# Patient Record
Sex: Male | Born: 1960
Health system: Southern US, Community
[De-identification: ages and names within clinical notes are randomized; demographics above are authoritative.]

## PROBLEM LIST (undated history)

## (undated) DIAGNOSIS — R740 Nonspecific elevation of levels of transaminase and lactic acid dehydrogenase [LDH]: Secondary | ICD-10-CM

## (undated) DIAGNOSIS — E66811 Obesity, class 1: Secondary | ICD-10-CM

## (undated) DIAGNOSIS — N182 Chronic kidney disease, stage 2 (mild): Secondary | ICD-10-CM

## (undated) DIAGNOSIS — L920 Granuloma annulare: Secondary | ICD-10-CM

## (undated) DIAGNOSIS — E782 Mixed hyperlipidemia: Secondary | ICD-10-CM

## (undated) DIAGNOSIS — E669 Obesity, unspecified: Secondary | ICD-10-CM

## (undated) DIAGNOSIS — K5792 Diverticulitis of intestine, part unspecified, without perforation or abscess without bleeding: Secondary | ICD-10-CM

## (undated) DIAGNOSIS — G459 Transient cerebral ischemic attack, unspecified: Secondary | ICD-10-CM

## (undated) DIAGNOSIS — R7301 Impaired fasting glucose: Secondary | ICD-10-CM

## (undated) DIAGNOSIS — I10 Essential (primary) hypertension: Secondary | ICD-10-CM

## (undated) HISTORY — PX: MOUTH SURGERY: SHX715

## (undated) HISTORY — DX: Obesity, unspecified: E66.9

## (undated) HISTORY — DX: Obesity, class 1: E66.811

## (undated) HISTORY — PX: COLONOSCOPY: SHX174

## (undated) HISTORY — DX: Mixed hyperlipidemia: E78.2

## (undated) HISTORY — DX: Chronic kidney disease, stage 2 (mild): N18.2

## (undated) HISTORY — PX: VASECTOMY: SHX75

## (undated) HISTORY — PX: BICEPS TENDON REPAIR: SHX566

## (undated) HISTORY — DX: Impaired fasting glucose: R73.01

## (undated) HISTORY — DX: Transient cerebral ischemic attack, unspecified: G45.9

## (undated) HISTORY — DX: Nonspecific elevation of levels of transaminase and lactic acid dehydrogenase (ldh): R74.0

## (undated) HISTORY — DX: Granuloma annulare: L92.0

## (undated) HISTORY — PX: COLON SURGERY: SHX602

---

## 2007-04-20 HISTORY — PX: LIPOMA RESECTION: SHX23

## 2008-02-20 DIAGNOSIS — L920 Granuloma annulare: Secondary | ICD-10-CM

## 2008-02-20 HISTORY — DX: Granuloma annulare: L92.0

## 2009-11-03 ENCOUNTER — Encounter: Admission: RE | Admit: 2009-11-03 | Discharge: 2009-11-03 | Payer: Self-pay | Admitting: Family Medicine

## 2013-04-19 HISTORY — PX: HAMMER TOE SURGERY: SHX385

## 2013-11-03 ENCOUNTER — Emergency Department (HOSPITAL_BASED_OUTPATIENT_CLINIC_OR_DEPARTMENT_OTHER)
Admission: EM | Admit: 2013-11-03 | Discharge: 2013-11-04 | Disposition: A | Discharge status: Home / Self Care | Payer: 59 | Attending: Emergency Medicine | Admitting: Emergency Medicine

## 2013-11-03 ENCOUNTER — Encounter (HOSPITAL_BASED_OUTPATIENT_CLINIC_OR_DEPARTMENT_OTHER): Payer: Self-pay | Admitting: Emergency Medicine

## 2013-11-03 DIAGNOSIS — K529 Noninfective gastroenteritis and colitis, unspecified: Secondary | ICD-10-CM

## 2013-11-03 DIAGNOSIS — I10 Essential (primary) hypertension: Secondary | ICD-10-CM | POA: Insufficient documentation

## 2013-11-03 DIAGNOSIS — F17200 Nicotine dependence, unspecified, uncomplicated: Secondary | ICD-10-CM | POA: Insufficient documentation

## 2013-11-03 DIAGNOSIS — K5289 Other specified noninfective gastroenteritis and colitis: Secondary | ICD-10-CM | POA: Insufficient documentation

## 2013-11-03 HISTORY — DX: Essential (primary) hypertension: I10

## 2013-11-03 NOTE — ED Notes (Signed)
Pt reports abd pain x2 days pt also reports foot surgery with general anesthisia on Thursday past

## 2013-11-04 ENCOUNTER — Emergency Department (HOSPITAL_BASED_OUTPATIENT_CLINIC_OR_DEPARTMENT_OTHER): Payer: 59

## 2013-11-04 ENCOUNTER — Encounter (HOSPITAL_BASED_OUTPATIENT_CLINIC_OR_DEPARTMENT_OTHER): Payer: Self-pay | Admitting: Emergency Medicine

## 2013-11-04 LAB — COMPREHENSIVE METABOLIC PANEL
ALK PHOS: 46 U/L (ref 39–117)
ALT: 24 U/L (ref 0–53)
ANION GAP: 17 — AB (ref 5–15)
AST: 17 U/L (ref 0–37)
Albumin: 4.2 g/dL (ref 3.5–5.2)
BUN: 15 mg/dL (ref 6–23)
CO2: 24 mEq/L (ref 19–32)
CREATININE: 1.2 mg/dL (ref 0.50–1.35)
Calcium: 9.7 mg/dL (ref 8.4–10.5)
Chloride: 100 mEq/L (ref 96–112)
GFR calc non Af Amer: 67 mL/min — ABNORMAL LOW (ref >90)
GFR, EST AFRICAN AMERICAN: 78 mL/min — AB (ref >90)
GLUCOSE: 183 mg/dL — AB (ref 70–99)
POTASSIUM: 3.5 meq/L — AB (ref 3.7–5.3)
SODIUM: 141 meq/L (ref 137–147)
TOTAL PROTEIN: 7.9 g/dL (ref 6.0–8.3)
Total Bilirubin: 0.7 mg/dL (ref 0.3–1.2)

## 2013-11-04 LAB — CBC WITH DIFFERENTIAL/PLATELET
BASOS PCT: 0 % (ref 0–1)
Basophils Absolute: 0 10*3/uL (ref 0.0–0.1)
EOS ABS: 0 10*3/uL (ref 0.0–0.7)
EOS PCT: 0 % (ref 0–5)
HCT: 46.3 % (ref 39.0–52.0)
HEMOGLOBIN: 16.6 g/dL (ref 13.0–17.0)
LYMPHS ABS: 2.3 10*3/uL (ref 0.7–4.0)
Lymphocytes Relative: 21 % (ref 12–46)
MCH: 34.9 pg — AB (ref 26.0–34.0)
MCHC: 35.9 g/dL (ref 30.0–36.0)
MCV: 97.5 fL (ref 78.0–100.0)
Monocytes Absolute: 0.8 10*3/uL (ref 0.1–1.0)
Monocytes Relative: 7 % (ref 3–12)
Neutro Abs: 7.9 10*3/uL — ABNORMAL HIGH (ref 1.7–7.7)
Neutrophils Relative %: 72 % (ref 43–77)
Platelets: 175 10*3/uL (ref 150–400)
RBC: 4.75 MIL/uL (ref 4.22–5.81)
RDW: 12.4 % (ref 11.5–15.5)
WBC: 11 10*3/uL — ABNORMAL HIGH (ref 4.0–10.5)

## 2013-11-04 LAB — URINALYSIS, ROUTINE W REFLEX MICROSCOPIC
BILIRUBIN URINE: NEGATIVE
Glucose, UA: NEGATIVE mg/dL
Hgb urine dipstick: NEGATIVE
KETONES UR: NEGATIVE mg/dL
Leukocytes, UA: NEGATIVE
Nitrite: NEGATIVE
PROTEIN: NEGATIVE mg/dL
UROBILINOGEN UA: 1 mg/dL (ref 0.0–1.0)
pH: 6 (ref 5.0–8.0)

## 2013-11-04 LAB — LIPASE, BLOOD: Lipase: 30 U/L (ref 11–59)

## 2013-11-04 LAB — OCCULT BLOOD X 1 CARD TO LAB, STOOL: FECAL OCCULT BLD: POSITIVE — AB

## 2013-11-04 MED ORDER — SODIUM CHLORIDE 0.9 % IV BOLUS (SEPSIS)
500.0000 mL | Freq: Once | INTRAVENOUS | Status: AC
  Administered 2013-11-04: 500 mL via INTRAVENOUS

## 2013-11-04 MED ORDER — IOHEXOL 300 MG/ML  SOLN
50.0000 mL | Freq: Once | INTRAMUSCULAR | Status: AC | PRN
  Administered 2013-11-04: 50 mL via ORAL

## 2013-11-04 MED ORDER — MORPHINE SULFATE 2 MG/ML IJ SOLN
2.0000 mg | Freq: Once | INTRAMUSCULAR | Status: AC
  Administered 2013-11-04: 2 mg via INTRAVENOUS
  Filled 2013-11-04: qty 1

## 2013-11-04 MED ORDER — ONDANSETRON HCL 4 MG/2ML IJ SOLN
4.0000 mg | Freq: Once | INTRAMUSCULAR | Status: AC
  Administered 2013-11-04: 4 mg via INTRAVENOUS
  Filled 2013-11-04: qty 2

## 2013-11-04 MED ORDER — CIPROFLOXACIN HCL 500 MG PO TABS: 500.0000 mg | ORAL_TABLET | Freq: Two times a day (BID) | ORAL | Status: DC

## 2013-11-04 MED ORDER — MORPHINE SULFATE 4 MG/ML IJ SOLN
4.0000 mg | Freq: Once | INTRAMUSCULAR | Status: AC
  Administered 2013-11-04: 4 mg via INTRAVENOUS
  Filled 2013-11-04: qty 1

## 2013-11-04 MED ORDER — CIPROFLOXACIN HCL 500 MG PO TABS
500.0000 mg | ORAL_TABLET | Freq: Once | ORAL | Status: AC
  Administered 2013-11-04: 500 mg via ORAL
  Filled 2013-11-04: qty 1

## 2013-11-04 MED ORDER — METRONIDAZOLE 500 MG PO TABS: 500.0000 mg | ORAL_TABLET | Freq: Three times a day (TID) | ORAL | Status: DC

## 2013-11-04 MED ORDER — IOHEXOL 300 MG/ML  SOLN
100.0000 mL | Freq: Once | INTRAMUSCULAR | Status: AC | PRN
  Administered 2013-11-04: 100 mL via INTRAVENOUS

## 2013-11-04 MED ORDER — ALUM & MAG HYDROXIDE-SIMETH 200-200-20 MG/5ML PO SUSP
30.0000 mL | Freq: Once | ORAL | Status: AC
  Administered 2013-11-04: 30 mL via ORAL

## 2013-11-04 MED ORDER — METRONIDAZOLE 500 MG PO TABS
500.0000 mg | ORAL_TABLET | Freq: Once | ORAL | Status: AC
  Administered 2013-11-04: 500 mg via ORAL
  Filled 2013-11-04: qty 1

## 2013-11-04 MED ORDER — MAGNESIUM HYDROXIDE 400 MG/5ML PO SUSP
30.0000 mL | Freq: Every evening | ORAL | Status: DC | PRN
  Filled 2013-11-04: qty 30

## 2013-11-04 MED ORDER — OXYCODONE-ACETAMINOPHEN 5-325 MG PO TABS: 2.0000 | ORAL_TABLET | Freq: Four times a day (QID) | ORAL | Status: DC | PRN

## 2013-11-04 MED ORDER — ALUM & MAG HYDROXIDE-SIMETH 200-200-20 MG/5ML PO SUSP
ORAL | Status: AC
  Filled 2013-11-04: qty 30

## 2013-11-04 NOTE — ED Notes (Signed)
Patient reports that he is having frequent frank bloody stools. He is also having lower pelvic pain in waves. The patient has a history of diverticulitis.

## 2013-11-04 NOTE — ED Notes (Signed)
Urine specimen requested pt states unable to void at this time. 

## 2013-11-04 NOTE — ED Provider Notes (Signed)
CSN: [id number]     Arrival date & time 11/03/13  2329 History  This chart was scribed for Jonathan Cumbo [name], MD by Phillis HaggisGabriella Gaje, ED Scribe. This patient was seen in room MH03/MH03 and patient care was started at 12:38 AM.   Chief Complaint  Patient presents with  . Abdominal Pain   Patient is a 53 y.o. male presenting with abdominal pain. The history is provided by the patient. No language interpreter was used.  Abdominal Pain Pain location:  Suprapubic Pain quality: cramping, pressure and sharp   Pain radiates to:  Does not radiate Pain severity:  Severe Onset quality:  Sudden Timing:  Constant Context: not alcohol use and not trauma   Relieved by:  Nothing Worsened by:  Nothing tried Ineffective treatments:  None tried Associated symptoms: diarrhea   Associated symptoms: no chest pain, no fever, no nausea and no vomiting   Risk factors: has not had multiple surgeries   HPI Comments: Jonathan Graves is a 53 y.o. male with a history of HTN who presents to the Emergency Department complaining of abdominal pain onset two days ago. He reports tremendous pain in his lower abdomen and bloody mucous diarrhea and has been passing a lot of gas. He states that he had foot surgery Tuesday with high BP issues, but went to work on Friday.  He stopped taking the pain meds for the foot surgery on Thursday. He reports a history of diverticulitis and believes that this is another attack. He reports that he was not hospitalized for his last episode of diverticulitis, which was 4 years ago.He denies taking anything for the pain.  He states that he was on oxycodone for pain for two days then quit soon after. He started taking BP medication Tuesday but has never had history of BP before surgery. He and his wife have continually measured his BP over the past week.He reports that he saw Dr. Foy GuadalajaraFried Thursday for a f/u.  Dr. Foy GuadalajaraFried took a blood panel but the panel has not come back yet. He denies vomiting, fever,  post-surgery complications, diarrhea, or bowel incontinence. He denies any changes in lifestyle, excluding his vacation last week. He denies history of other surgeries but recently had a colonoscopy, with negative results. He states that he occasionally smokes and drinks.   Past Medical History  Diagnosis Date  . Hypertension    History reviewed. No pertinent past surgical history. History reviewed. No pertinent family history. History  Substance Use Topics  . Smoking status: Current Some Day Smoker  . Smokeless tobacco: Not on file  . Alcohol Use: Yes    Review of Systems  Constitutional: Negative for fever.  Cardiovascular: Negative for chest pain.  Gastrointestinal: Positive for abdominal pain, diarrhea and blood in stool. Negative for nausea and vomiting.  All other systems reviewed and are negative.  Allergies  Review of patient's allergies indicates no known allergies.  Home Medications   Prior to Admission medications   Not on File   BP 185/99  Pulse 59  Temp(Src) 98.1 F (36.7 C) (Oral)  Resp 20  SpO2 96% Physical Exam  Constitutional: He is oriented to person, place, and time. He appears well-developed and well-nourished. No distress.  HENT:  Head: Normocephalic and atraumatic.  Mouth/Throat: Oropharynx is clear and moist. No oropharyngeal exudate.  Eyes: Conjunctivae and EOM are normal. Pupils are equal, round, and reactive to light.  Neck: Normal range of motion. Neck supple.  Cardiovascular: Normal rate, regular rhythm and normal heart  sounds.   Pulmonary/Chest: Effort normal and breath sounds normal. He has no wheezes. He has no rales. He exhibits no tenderness.  Abdominal: Soft. He exhibits distension. He exhibits no mass. There is no tenderness. There is no rebound and no guarding.  Hyperactive bowel sounds. Worse in RUQ. Stool in rectum and sigmoid colon. Full, bloating.   Musculoskeletal: Normal range of motion. He exhibits no edema.  Lymphadenopathy:     He has no cervical adenopathy.  Neurological: He is alert and oriented to person, place, and time. He has normal reflexes.  Skin: Skin is warm and dry.  Psychiatric: He has a normal mood and affect. His behavior is normal.    ED Course  Procedures (including critical care time) DIAGNOSTIC STUDIES: Oxygen Saturation is 96% on room air, normal by my interpretation.    COORDINATION OF CARE: 12:45 AM-Discussed treatment plan which includes CT scan with pt at bedside and pt agreed to plan.   Labs Review Labs Reviewed  CBC WITH DIFFERENTIAL - Abnormal; Notable for the following:    WBC 11.0 (*)    MCH 34.9 (*)    Neutro Abs 7.9 (*)    All other components within normal limits  COMPREHENSIVE METABOLIC PANEL - Abnormal; Notable for the following:    Potassium 3.5 (*)    Glucose, Bld 183 (*)    GFR calc non Af Amer 67 (*)    GFR calc Af Amer 78 (*)    Anion gap 17 (*)    All other components within normal limits  OCCULT BLOOD X 1 CARD TO LAB, STOOL - Abnormal; Notable for the following:    Fecal Occult Bld POSITIVE (*)    All other components within normal limits  LIPASE, BLOOD  URINALYSIS, ROUTINE W REFLEX MICROSCOPIC  POC OCCULT BLOOD, ED   Imaging Review No results found.   EKG Interpretation None      MDM   Final diagnoses:  None    Hemoglobin is normal is able to tolerate PO meds and would like to go home.  Will d/c on cipro, flagyl and percocet and recommend close follow up with both his PMD for recheck and HTN and GI for colonoscopy.  Patient and family verbalize understanding and agree to follow up.  Return immediately for fever, inability to tolerate medication or food, intractable pain or any concerns.     I personally performed the services described in this documentation, which was scribed in my presence. The recorded information has been reviewed and is accurate.      [name], MD 11/04/13 1610

## 2013-11-04 NOTE — Discharge Instructions (Signed)

## 2014-04-13 ENCOUNTER — Encounter (HOSPITAL_COMMUNITY): Payer: Self-pay | Admitting: *Deleted

## 2014-04-13 ENCOUNTER — Emergency Department (HOSPITAL_COMMUNITY)
Admission: EM | Admit: 2014-04-13 | Discharge: 2014-04-13 | Disposition: A | Discharge status: Home / Self Care | Payer: 59 | Attending: Emergency Medicine | Admitting: Emergency Medicine

## 2014-04-13 DIAGNOSIS — Z7982 Long term (current) use of aspirin: Secondary | ICD-10-CM | POA: Insufficient documentation

## 2014-04-13 DIAGNOSIS — R55 Syncope and collapse: Secondary | ICD-10-CM | POA: Diagnosis not present

## 2014-04-13 DIAGNOSIS — Y998 Other external cause status: Secondary | ICD-10-CM | POA: Insufficient documentation

## 2014-04-13 DIAGNOSIS — Z720 Tobacco use: Secondary | ICD-10-CM | POA: Insufficient documentation

## 2014-04-13 DIAGNOSIS — I10 Essential (primary) hypertension: Secondary | ICD-10-CM | POA: Insufficient documentation

## 2014-04-13 DIAGNOSIS — Y9389 Activity, other specified: Secondary | ICD-10-CM | POA: Insufficient documentation

## 2014-04-13 DIAGNOSIS — K5792 Diverticulitis of intestine, part unspecified, without perforation or abscess without bleeding: Secondary | ICD-10-CM | POA: Insufficient documentation

## 2014-04-13 DIAGNOSIS — S76312A Strain of muscle, fascia and tendon of the posterior muscle group at thigh level, left thigh, initial encounter: Secondary | ICD-10-CM

## 2014-04-13 DIAGNOSIS — Z79899 Other long term (current) drug therapy: Secondary | ICD-10-CM | POA: Diagnosis not present

## 2014-04-13 DIAGNOSIS — S76812A Strain of other specified muscles, fascia and tendons at thigh level, left thigh, initial encounter: Secondary | ICD-10-CM | POA: Diagnosis not present

## 2014-04-13 DIAGNOSIS — Y9289 Other specified places as the place of occurrence of the external cause: Secondary | ICD-10-CM | POA: Diagnosis not present

## 2014-04-13 DIAGNOSIS — W010XXA Fall on same level from slipping, tripping and stumbling without subsequent striking against object, initial encounter: Secondary | ICD-10-CM

## 2014-04-13 DIAGNOSIS — S79921A Unspecified injury of right thigh, initial encounter: Secondary | ICD-10-CM | POA: Diagnosis present

## 2014-04-13 HISTORY — DX: Diverticulitis of intestine, part unspecified, without perforation or abscess without bleeding: K57.92

## 2014-04-13 MED ORDER — NAPROXEN 500 MG PO TABS: 500.0000 mg | ORAL_TABLET | Freq: Two times a day (BID) | ORAL | Status: DC

## 2014-04-13 MED ORDER — KETOROLAC TROMETHAMINE 30 MG/ML IJ SOLN
30.0000 mg | Freq: Once | INTRAMUSCULAR | Status: AC
  Administered 2014-04-13: 30 mg via INTRAVENOUS
  Filled 2014-04-13: qty 1

## 2014-04-13 MED ORDER — SODIUM CHLORIDE 0.9 % IV BOLUS (SEPSIS)
1000.0000 mL | Freq: Once | INTRAVENOUS | Status: AC
  Administered 2014-04-13: 1000 mL via INTRAVENOUS

## 2014-04-13 MED ORDER — OXYCODONE-ACETAMINOPHEN 5-325 MG PO TABS
1.0000 | ORAL_TABLET | Freq: Once | ORAL | Status: AC
  Administered 2014-04-13: 1 via ORAL
  Filled 2014-04-13: qty 1

## 2014-04-13 MED ORDER — OXYCODONE-ACETAMINOPHEN 5-325 MG PO TABS: 1.0000 | ORAL_TABLET | ORAL | Status: DC | PRN

## 2014-04-13 NOTE — ED Provider Notes (Signed)
CSN: [id number]     Arrival date & time 04/13/14  0227 History   First MD Initiated Contact with Patient 04/13/14 [id number]     Chief Complaint  Patient presents with  . Fall  . Loss of Consciousness     (Consider location/radiation/quality/duration/timing/severity/associated sxs/prior Treatment) Patient is a 53 y.o. male presenting with fall and syncope. The history is provided by the patient.  Fall  Loss of Consciousness He slipped on wet deck and suffered injury to the posterior aspect of his left leg. He was in severe pain and he has passed out several times because of pain. Is also noted some mild pain in the medial aspect of the proximal right thigh. He denies other injury. He rates pain at 10/17 is worse. Currently, his left leg is supported and he states it is pain-free as long as it is properly supported. He does admit to having had 4 glasses of wine earlier in the day. He denies chest pain, dyspnea, nausea, diaphoresis.  Past Medical History  Diagnosis Date  . Hypertension   . Diverticulitis    Past Surgical History  Procedure Laterality Date  . Hammer toe surgery     No family history on file. History  Substance Use Topics  . Smoking status: Current Every Day Smoker -- 0.25 packs/day  . Smokeless tobacco: Not on file  . Alcohol Use: 16.2 oz/week    6 Cans of beer, 21 Shots of liquor per week    Review of Systems  Cardiovascular: Positive for syncope.  All other systems reviewed and are negative.     Allergies  Review of patient's allergies indicates no known allergies.  Home Medications   Prior to Admission medications   Medication Sig Start Date End Date Taking? Authorizing Provider  aspirin EC 81 MG tablet Take 81 mg by mouth daily.   Yes Historical Provider, MD  FIBER PO Take 1 tablet by mouth daily.   Yes Historical Provider, MD  lisinopril-hydrochlorothiazide (PRINZIDE,ZESTORETIC) 20-25 MG per tablet Take 1 tablet by mouth daily.   Yes Historical  Provider, MD  oxyCODONE-acetaminophen (PERCOCET) 5-325 MG per tablet Take 2 tablets by mouth every 6 (six) hours as needed. Patient taking differently: Take 1-2 tablets by mouth every 6 (six) hours as needed for moderate pain.  11/04/13  Yes April K Palumbo-Rasch, MD  vitamin B-12 (CYANOCOBALAMIN) 1000 MCG tablet Take 1,000 mcg by mouth daily.   Yes Historical Provider, MD  ciprofloxacin (CIPRO) 500 MG tablet Take 1 tablet (500 mg total) by mouth 2 (two) times daily. One po bid x 10 days Patient not taking: Reported on 04/13/2014 11/04/13   April K Palumbo-Rasch, MD  metroNIDAZOLE (FLAGYL) 500 MG tablet Take 1 tablet (500 mg total) by mouth 3 (three) times daily. One po bid x 10 days Patient not taking: Reported on 04/13/2014 11/04/13   April K Palumbo-Rasch, MD   BP 110/59 mmHg  Pulse 72  Temp(Src) 98.4 F (36.9 C) (Oral)  Resp 21  Ht 6' (1.829 m)  Wt 235 lb (106.595 kg)  BMI 31.86 kg/m2  SpO2 94% Physical Exam  Nursing note and vitals reviewed.  53 year old male, resting comfortably and in no acute distress. Vital signs are significant for borderline tachypnea. Oxygen saturation is 94%, which is normal. Head is normocephalic and atraumatic. PERRLA, EOMI. Oropharynx is clear. Neck is nontender and supple without adenopathy or JVD. Back is nontender and there is no CVA tenderness. Lungs are clear without rales, wheezes, or rhonchi. Chest  is nontender. Heart has regular rate and rhythm without murmur. Abdomen is soft, flat, nontender without masses or hepatosplenomegaly and peristalsis is normoactive. Extremities: There is marked tenderness to palpation the posterior aspect of the left thigh consistent with hamstring muscle strain. There is no tenderness to palpation in the left hip or left knee. Distal neurovascular examination is intact with strong pulses, prompt capillary refill, normal sensation, normal motor function. There is also an area of induration and tenderness in the medial  aspect of the proximal right thigh consistent with old groin muscle.. Skin is warm and dry without rash. Neurologic: Mental status is normal, cranial nerves are intact, there are no motor or sensory deficits.  ED Course  Procedures (including critical care time)   EKG Interpretation   Date/Time:  Saturday April 13 2014 02:40:42 EST Ventricular Rate:  69 PR Interval:  186 QRS Duration: 104 QT Interval:  398 QTC Calculation: 426 R Axis:   33 Text Interpretation:  Sinus rhythm Low voltage, precordial leads Abnormal  R-wave progression, early transition No old tracing to compare Confirmed  by [facility]  MD, Arnie Maiolo ([id number]) on 04/13/2014 2:56:36 AM      MDM   Final diagnoses:  Fall from slip, trip, or stumble, initial encounter  Strain of hamstring muscle, left, initial encounter  Syncope, vasovagal    Left hamstring injury with also strain of the right thigh. Syncope appears to be vasovagal related to pain. However in some of more serious cause of syncope. Blood pressure is noted to be borderline low and he will be given IV hydration. Initial dose of ketorolac is given but I suspect he will need narcotics for pain control. Necrotic administration is withheld until blood pressure is adequate.  He feels significantly better after ketorolac. He was also given a dose of oxycodone-acetaminophen. He has given a set of crutches and is referred to orthopedics for follow-up. He is discharged with prescriptions for naproxen and oxycodone-acetaminophen.  Dione Boozeavid Keniesha Adderly, MD 04/13/14 [phone]

## 2014-04-13 NOTE — ED Notes (Signed)
Pt arrives via EMS from home. States around 2300 pt slipped and fell on his wet deck. Pt c/o left hamspring pain since. States that when the pain is severe he passes out. States he passed out x3. States that the pain is relieved from pressure.

## 2014-04-13 NOTE — Discharge Instructions (Signed)
Use crutches as needed.  Hamstring Strain  Hamstrings are the large muscles in the back of the thighs. A strain or tear injury happens when there is a sudden stretch or pull on these muscles and tendons. Tendons are cord like structures that attach muscle to bone. These injuries are commonly seen in activities such as sprinting due to sudden acceleration.  DIAGNOSIS  Often the diagnosis can be made by examination. HOME CARE INSTRUCTIONS   Apply ice to the sore area for 15-5720minutes, 03-04 times per day. Do this while awake for the first 2 days. Put the ice in a plastic bag, and place a towel between the bag of ice and your skin.  Keep your knee flexed when possible. This means your foot is held off the ground slightly if you are on crutches. When lying down, a pillow under the knee will take strain off the muscles and provide some relief.  If a compression bandage such as an ace wrap was applied, use it until you are seen again. You may remove it for sleeping, showers and baths. If the wrap seems to be too tight and is uncomfortable, wrap it more loosely. If your toes or foot are getting cold or blue, it is too tight.  Walk or move around as the pain allows, or as instructed. Resume full activities as suggested by your caregiver. This is often safest when the strength of the injured leg has nearly returned to normal.  Only take over-the-counter or prescription medicines for pain, discomfort, or fever as directed by your caregiver. SEEK MEDICAL CARE IF:  1. You have an increase in bruising, swelling or pain. 2. You notice coldness or blueness of your toes or foot. 3. Pain relief is not obtained with medications. 4. You have increasing pain in the area and seem to be getting worse rather than better. 5. You notice your thigh getting larger in size (this could indicate bleeding into the muscle). Document Released: 12/29/2000 Document Revised: 06/28/2011 Document Reviewed: 04/07/2008 [facility]  Patient Information 2015 [place], [place]. This information is not intended to replace advice given to you by your health care provider. Make sure you discuss any questions you have with your health care provider.  Crutch Use Crutches are used to take weight off one of your legs or feet when you stand or walk. It is important to use crutches that fit properly. When fitted properly:  Each crutch should be 2-3 finger widths below the armpit.  Your weight should be supported by your hand, and not by resting the armpit on the crutch.  RISKS AND COMPLICATIONS Damage to the nerves that extend from your armpit to your hand and arm. To prevent this from happening, make sure your crutches fit properly and do not put pressure on your armpit when using them. HOW TO USE YOUR CRUTCHES If you have been instructed to use partial weight bearing, apply (bear) the amount of weight as your health care provider suggests. Do not bear weight in an amount that causes pain to the area of injury. Walking 6. Step with the crutches. 7. Swing the healthy leg slightly ahead of the crutches. Going Up Steps If there is no handrail: 1. Step up with the healthy leg. 2. Step up with the crutches and injured leg. 3. Continue in this way. If there is a handrail: 1. Hold both crutches in one hand. 2. Place your free hand on the handrail. 3. While putting your weight on your arms, lift your healthy  leg to the step. 4. Bring the crutches and the injured leg up to that step. 5. Continue in this way. Going Down Steps Be very careful, as going down stairs with crutches is very challenging. If there is no handrail: 1. Step down with the injured leg and crutches. 2. Step down with the healthy leg. If there is a handrail: 1. Place your hand on the handrail. 2. Hold both crutches with your free hand. 3. Lower your injured leg and crutch to the step below you. Make sure to keep the crutch tips in the center of the step, never on  the edge. 4. Lower your healthy leg to that step. 5. Continue in this way. Standing Up 1. Hold the injured leg forward. 2. Grab the armrest with one hand and the top of the crutches with the other hand. 3. Using these supports, pull yourself up to a standing position. Sitting Down 1. Hold the injured leg forward. 2. Grab the armrest with one hand and the top of the crutches with the other hand. 3. Lower yourself to a sitting position. SEEK MEDICAL CARE IF:  You still feel unsteady on your feet.  You develop new pain, for example in your armpits, back, shoulder, wrist, or hip.  You develop any numbness or tingling. SEEK IMMEDIATE MEDICAL CARE IF: You fall. Document Released: 04/02/2000 Document Revised: 04/10/2013 Document Reviewed: 12/11/2012 [facility]ExitCare Patient Information 2015 [place], [place]. This information is not intended to replace advice given to you by your health care provider. Make sure you discuss any questions you have with your health care provider.  Naproxen and naproxen sodium oral immediate-release tablets What is this medicine? NAPROXEN (na PROX en) is a non-steroidal anti-inflammatory drug (NSAID). It is used to reduce swelling and to treat pain. This medicine may be used for dental pain, headache, or painful monthly periods. It is also used for painful joint and muscular problems such as arthritis, tendinitis, bursitis, and gout. This medicine may be used for other purposes; ask your health care provider or pharmacist if you have questions. COMMON BRAND NAME(S): Aflaxen, Aleve, Aleve Arthritis, All Day Relief, Anaprox, Anaprox DS, Naprosyn What should I tell my health care provider before I take this medicine? They need to know if you have any of these conditions: -asthma -cigarette smoker -drink more than 3 alcohol containing drinks a day -heart disease or circulation problems such as heart failure or leg edema (fluid retention) -high blood pressure -kidney  disease -liver disease -stomach bleeding or ulcers -an unusual or allergic reaction to naproxen, aspirin, other NSAIDs, other medicines, foods, dyes, or preservatives -pregnant or trying to get pregnant -breast-feeding How should I use this medicine? Take this medicine by mouth with a glass of water. Follow the directions on the prescription label. Take it with food if your stomach gets upset. Try to not lie down for at least 10 minutes after you take it. Take your medicine at regular intervals. Do not take your medicine more often than directed. Long-term, continuous use may increase the risk of heart attack or stroke. A special MedGuide will be given to you by the pharmacist with each prescription and refill. Be sure to read this information carefully each time. Talk to your pediatrician regarding the use of this medicine in children. Special care may be needed. Overdosage: If you think you have taken too much of this medicine contact a poison control center or emergency room at once. NOTE: This medicine is only for you. Do not share  this medicine with others. What if I miss a dose? If you miss a dose, take it as soon as you can. If it is almost time for your next dose, take only that dose. Do not take double or extra doses. What may interact with this medicine? -alcohol -aspirin -cidofovir -diuretics -lithium -methotrexate -other drugs for inflammation like ketorolac or prednisone -pemetrexed -probenecid -warfarin This list may not describe all possible interactions. Give your health care provider a list of all the medicines, herbs, non-prescription drugs, or dietary supplements you use. Also tell them if you smoke, drink alcohol, or use illegal drugs. Some items may interact with your medicine. What should I watch for while using this medicine? Tell your doctor or health care professional if your pain does not get better. Talk to your doctor before taking another medicine for pain. Do  not treat yourself. This medicine does not prevent heart attack or stroke. In fact, this medicine may increase the chance of a heart attack or stroke. The chance may increase with longer use of this medicine and in people who have heart disease. If you take aspirin to prevent heart attack or stroke, talk with your doctor or health care professional. Do not take other medicines that contain aspirin, ibuprofen, or naproxen with this medicine. Side effects such as stomach upset, nausea, or ulcers may be more likely to occur. Many medicines available without a prescription should not be taken with this medicine. This medicine can cause ulcers and bleeding in the stomach and intestines at any time during treatment. Do not smoke cigarettes or drink alcohol. These increase irritation to your stomach and can make it more susceptible to damage from this medicine. Ulcers and bleeding can happen without warning symptoms and can cause death. You may get drowsy or dizzy. Do not drive, use machinery, or do anything that needs mental alertness until you know how this medicine affects you. Do not stand or sit up quickly, especially if you are an older patient. This reduces the risk of dizzy or fainting spells. This medicine can cause you to bleed more easily. Try to avoid damage to your teeth and gums when you brush or floss your teeth. What side effects may I notice from receiving this medicine? Side effects that you should report to your doctor or health care professional as soon as possible: -black or bloody stools, blood in the urine or vomit -blurred vision -chest pain -difficulty breathing or wheezing -nausea or vomiting -severe stomach pain -skin rash, skin redness, blistering or peeling skin, hives, or itching -slurred speech or weakness on one side of the body -swelling of eyelids, throat, lips -unexplained weight gain or swelling -unusually weak or tired -yellowing of eyes or skin Side effects that  usually do not require medical attention (report to your doctor or health care professional if they continue or are bothersome): -constipation -headache -heartburn This list may not describe all possible side effects. Call your doctor for medical advice about side effects. You may report side effects to FDA at 1-800-FDA-1088. Where should I keep my medicine? Keep out of the reach of children. Store at room temperature between 15 and 30 degrees C (59 and 86 degrees F). Keep container tightly closed. Throw away any unused medicine after the expiration date. NOTE: This sheet is a summary. It may not cover all possible information. If you have questions about this medicine, talk to your doctor, pharmacist, or health care provider.  2015, Elsevier/Gold Standard. (2009-04-07 20:10:16)  Acetaminophen; Oxycodone  tablets What is this medicine? ACETAMINOPHEN; OXYCODONE (a set a MEE noe fen; ox i KOE done) is a pain reliever. It is used to treat mild to moderate pain. This medicine may be used for other purposes; ask your health care provider or pharmacist if you have questions. COMMON BRAND NAME(S): Endocet, Magnacet, Narvox, Percocet, Perloxx, Primalev, Primlev, Roxicet, Xolox What should I tell my health care provider before I take this medicine? They need to know if you have any of these conditions: -brain tumor -Crohn's disease, inflammatory bowel disease, or ulcerative colitis -drug abuse or addiction -head injury -heart or circulation problems -if you often drink alcohol -kidney disease or problems going to the bathroom -liver disease -lung disease, asthma, or breathing problems -an unusual or allergic reaction to acetaminophen, oxycodone, other opioid analgesics, other medicines, foods, dyes, or preservatives -pregnant or trying to get pregnant -breast-feeding How should I use this medicine? Take this medicine by mouth with a full glass of water. Follow the directions on the prescription  label. Take your medicine at regular intervals. Do not take your medicine more often than directed. Talk to your pediatrician regarding the use of this medicine in children. Special care may be needed. Patients over 25 years old may have a stronger reaction and need a smaller dose. Overdosage: If you think you have taken too much of this medicine contact a poison control center or emergency room at once. NOTE: This medicine is only for you. Do not share this medicine with others. What if I miss a dose? If you miss a dose, take it as soon as you can. If it is almost time for your next dose, take only that dose. Do not take double or extra doses. What may interact with this medicine? -alcohol -antihistamines -barbiturates like amobarbital, butalbital, butabarbital, methohexital, pentobarbital, phenobarbital, thiopental, and secobarbital -benztropine -drugs for bladder problems like solifenacin, trospium, oxybutynin, tolterodine, hyoscyamine, and methscopolamine -drugs for breathing problems like ipratropium and tiotropium -drugs for certain stomach or intestine problems like propantheline, homatropine methylbromide, glycopyrrolate, atropine, belladonna, and dicyclomine -general anesthetics like etomidate, ketamine, nitrous oxide, propofol, desflurane, enflurane, halothane, isoflurane, and sevoflurane -medicines for depression, anxiety, or psychotic disturbances -medicines for sleep -muscle relaxants -naltrexone -narcotic medicines (opiates) for pain -phenothiazines like perphenazine, thioridazine, chlorpromazine, mesoridazine, fluphenazine, prochlorperazine, promazine, and trifluoperazine -scopolamine -tramadol -trihexyphenidyl This list may not describe all possible interactions. Give your health care provider a list of all the medicines, herbs, non-prescription drugs, or dietary supplements you use. Also tell them if you smoke, drink alcohol, or use illegal drugs. Some items may interact  with your medicine. What should I watch for while using this medicine? Tell your doctor or health care professional if your pain does not go away, if it gets worse, or if you have new or a different type of pain. You may develop tolerance to the medicine. Tolerance means that you will need a higher dose of the medication for pain relief. Tolerance is normal and is expected if you take this medicine for a long time. Do not suddenly stop taking your medicine because you may develop a severe reaction. Your body becomes used to the medicine. This does NOT mean you are addicted. Addiction is a behavior related to getting and using a drug for a non-medical reason. If you have pain, you have a medical reason to take pain medicine. Your doctor will tell you how much medicine to take. If your doctor wants you to stop the medicine, the dose will be slowly  lowered over time to avoid any side effects. You may get drowsy or dizzy. Do not drive, use machinery, or do anything that needs mental alertness until you know how this medicine affects you. Do not stand or sit up quickly, especially if you are an older patient. This reduces the risk of dizzy or fainting spells. Alcohol may interfere with the effect of this medicine. Avoid alcoholic drinks. There are different types of narcotic medicines (opiates) for pain. If you take more than one type at the same time, you may have more side effects. Give your health care provider a list of all medicines you use. Your doctor will tell you how much medicine to take. Do not take more medicine than directed. Call emergency for help if you have problems breathing. The medicine will cause constipation. Try to have a bowel movement at least every 2 to 3 days. If you do not have a bowel movement for 3 days, call your doctor or health care professional. Do not take Tylenol (acetaminophen) or medicines that have acetaminophen with this medicine. Too much acetaminophen can be very  dangerous. Many nonprescription medicines contain acetaminophen. Always read the labels carefully to avoid taking more acetaminophen. What side effects may I notice from receiving this medicine? Side effects that you should report to your doctor or health care professional as soon as possible: -allergic reactions like skin rash, itching or hives, swelling of the face, lips, or tongue -breathing difficulties, wheezing -confusion -light headedness or fainting spells -severe stomach pain -unusually weak or tired -yellowing of the skin or the whites of the eyes Side effects that usually do not require medical attention (report to your doctor or health care professional if they continue or are bothersome): -dizziness -drowsiness -nausea -vomiting This list may not describe all possible side effects. Call your doctor for medical advice about side effects. You may report side effects to FDA at 1-800-FDA-1088. Where should I keep my medicine? Keep out of the reach of children. This medicine can be abused. Keep your medicine in a safe place to protect it from theft. Do not share this medicine with anyone. Selling or giving away this medicine is dangerous and against the law. Store at room temperature between 20 and 25 degrees C (68 and 77 degrees F). Keep container tightly closed. Protect from light. This medicine may cause accidental overdose and death if it is taken by other adults, children, or pets. Flush any unused medicine down the toilet to reduce the chance of harm. Do not use the medicine after the expiration date. NOTE: This sheet is a summary. It may not cover all possible information. If you have questions about this medicine, talk to your doctor, pharmacist, or health care provider.  2015, Elsevier/Gold Standard. (2012-11-27 13:17:35)

## 2014-08-15 ENCOUNTER — Other Ambulatory Visit: Payer: Self-pay | Admitting: Gastroenterology

## 2014-08-15 DIAGNOSIS — R1084 Generalized abdominal pain: Secondary | ICD-10-CM

## 2014-08-16 ENCOUNTER — Ambulatory Visit
Admission: RE | Admit: 2014-08-16 | Discharge: 2014-08-16 | Disposition: A | Discharge status: Home / Self Care | Payer: BLUE CROSS/BLUE SHIELD | Source: Ambulatory Visit | Attending: Gastroenterology | Admitting: Gastroenterology

## 2014-08-16 ENCOUNTER — Encounter (HOSPITAL_COMMUNITY): Payer: Self-pay

## 2014-08-16 ENCOUNTER — Inpatient Hospital Stay (HOSPITAL_COMMUNITY)
Admission: EM | Admit: 2014-08-16 | Discharge: 2014-08-21 | DRG: 345 | Disposition: A | Discharge status: Home / Self Care | Payer: BLUE CROSS/BLUE SHIELD | Attending: Internal Medicine | Admitting: Internal Medicine

## 2014-08-16 DIAGNOSIS — R109 Unspecified abdominal pain: Secondary | ICD-10-CM | POA: Diagnosis present

## 2014-08-16 DIAGNOSIS — Z7982 Long term (current) use of aspirin: Secondary | ICD-10-CM

## 2014-08-16 DIAGNOSIS — E785 Hyperlipidemia, unspecified: Secondary | ICD-10-CM | POA: Diagnosis present

## 2014-08-16 DIAGNOSIS — I10 Essential (primary) hypertension: Secondary | ICD-10-CM | POA: Diagnosis present

## 2014-08-16 DIAGNOSIS — Z6830 Body mass index (BMI) 30.0-30.9, adult: Secondary | ICD-10-CM

## 2014-08-16 DIAGNOSIS — F1010 Alcohol abuse, uncomplicated: Secondary | ICD-10-CM | POA: Diagnosis present

## 2014-08-16 DIAGNOSIS — K5720 Diverticulitis of large intestine with perforation and abscess without bleeding: Secondary | ICD-10-CM | POA: Diagnosis present

## 2014-08-16 DIAGNOSIS — Z7289 Other problems related to lifestyle: Secondary | ICD-10-CM | POA: Diagnosis present

## 2014-08-16 DIAGNOSIS — K5780 Diverticulitis of intestine, part unspecified, with perforation and abscess without bleeding: Secondary | ICD-10-CM | POA: Diagnosis not present

## 2014-08-16 DIAGNOSIS — R1084 Generalized abdominal pain: Secondary | ICD-10-CM

## 2014-08-16 DIAGNOSIS — E440 Moderate protein-calorie malnutrition: Secondary | ICD-10-CM | POA: Diagnosis present

## 2014-08-16 DIAGNOSIS — K5792 Diverticulitis of intestine, part unspecified, without perforation or abscess without bleeding: Secondary | ICD-10-CM

## 2014-08-16 DIAGNOSIS — F17210 Nicotine dependence, cigarettes, uncomplicated: Secondary | ICD-10-CM | POA: Diagnosis present

## 2014-08-16 DIAGNOSIS — F1099 Alcohol use, unspecified with unspecified alcohol-induced disorder: Secondary | ICD-10-CM | POA: Diagnosis not present

## 2014-08-16 DIAGNOSIS — Z789 Other specified health status: Secondary | ICD-10-CM | POA: Diagnosis present

## 2014-08-16 LAB — COMPREHENSIVE METABOLIC PANEL
ALK PHOS: 48 U/L (ref 39–117)
ALT: 27 U/L (ref 0–53)
ANION GAP: 11 (ref 5–15)
AST: 23 U/L (ref 0–37)
Albumin: 3.5 g/dL (ref 3.5–5.2)
BUN: 12 mg/dL (ref 6–23)
CO2: 24 mmol/L (ref 19–32)
CREATININE: 1.21 mg/dL (ref 0.50–1.35)
Calcium: 9.1 mg/dL (ref 8.4–10.5)
Chloride: 104 mmol/L (ref 96–112)
GFR calc non Af Amer: 66 mL/min — ABNORMAL LOW (ref >90)
GFR, EST AFRICAN AMERICAN: 77 mL/min — AB (ref >90)
Glucose, Bld: 83 mg/dL (ref 70–99)
Potassium: 4.6 mmol/L (ref 3.5–5.1)
Sodium: 139 mmol/L (ref 135–145)
Total Bilirubin: 0.5 mg/dL (ref 0.3–1.2)
Total Protein: 7.4 g/dL (ref 6.0–8.3)

## 2014-08-16 LAB — CBC WITH DIFFERENTIAL/PLATELET
Basophils Absolute: 0.1 10*3/uL (ref 0.0–0.1)
Basophils Relative: 0 % (ref 0–1)
Eosinophils Absolute: 0.2 10*3/uL (ref 0.0–0.7)
Eosinophils Relative: 2 % (ref 0–5)
HCT: 37.6 % — ABNORMAL LOW (ref 39.0–52.0)
Hemoglobin: 12.8 g/dL — ABNORMAL LOW (ref 13.0–17.0)
LYMPHS PCT: 35 % (ref 12–46)
Lymphs Abs: 4 10*3/uL (ref 0.7–4.0)
MCH: 33.2 pg (ref 26.0–34.0)
MCHC: 34 g/dL (ref 30.0–36.0)
MCV: 97.7 fL (ref 78.0–100.0)
MONO ABS: 0.9 10*3/uL (ref 0.1–1.0)
MONOS PCT: 8 % (ref 3–12)
NEUTROS ABS: 6.2 10*3/uL (ref 1.7–7.7)
NEUTROS PCT: 55 % (ref 43–77)
PLATELETS: 367 10*3/uL (ref 150–400)
RBC: 3.85 MIL/uL — ABNORMAL LOW (ref 4.22–5.81)
RDW: 13.1 % (ref 11.5–15.5)
WBC: 11.4 10*3/uL — AB (ref 4.0–10.5)

## 2014-08-16 LAB — LIPASE, BLOOD: LIPASE: 33 U/L (ref 11–59)

## 2014-08-16 MED ORDER — VITAMIN B-12 1000 MCG PO TABS
1000.0000 ug | ORAL_TABLET | Freq: Every day | ORAL | Status: DC
  Administered 2014-08-17 – 2014-08-21 (×4): 1000 ug via ORAL
  Filled 2014-08-16 (×5): qty 1

## 2014-08-16 MED ORDER — SODIUM CHLORIDE 0.9 % IJ SOLN: 3.0000 mL | Freq: Two times a day (BID) | INTRAMUSCULAR | Status: DC

## 2014-08-16 MED ORDER — MORPHINE SULFATE 2 MG/ML IJ SOLN: 2.0000 mg | INTRAMUSCULAR | Status: DC | PRN

## 2014-08-16 MED ORDER — METRONIDAZOLE IN NACL 5-0.79 MG/ML-% IV SOLN
500.0000 mg | Freq: Three times a day (TID) | INTRAVENOUS | Status: DC
Start: 2014-08-16 — End: 2014-08-16
  Filled 2014-08-16: qty 100

## 2014-08-16 MED ORDER — CIPROFLOXACIN IN D5W 400 MG/200ML IV SOLN
400.0000 mg | Freq: Once | INTRAVENOUS | Status: AC
Start: 2014-08-16 — End: 2014-08-16
  Administered 2014-08-16: 400 mg via INTRAVENOUS
  Filled 2014-08-16: qty 200

## 2014-08-16 MED ORDER — ASPIRIN EC 81 MG PO TBEC
81.0000 mg | DELAYED_RELEASE_TABLET | Freq: Every day | ORAL | Status: DC
  Administered 2014-08-17 – 2014-08-21 (×4): 81 mg via ORAL
  Filled 2014-08-16 (×5): qty 1

## 2014-08-16 MED ORDER — SODIUM CHLORIDE 0.9 % IV SOLN
INTRAVENOUS | Status: DC
  Administered 2014-08-16 – 2014-08-21 (×9): via INTRAVENOUS

## 2014-08-16 MED ORDER — ENOXAPARIN SODIUM 40 MG/0.4ML ~~LOC~~ SOLN
40.0000 mg | Freq: Every day | SUBCUTANEOUS | Status: DC
  Administered 2014-08-17 – 2014-08-20 (×3): 40 mg via SUBCUTANEOUS
  Filled 2014-08-16 (×4): qty 0.4

## 2014-08-16 MED ORDER — OXYCODONE-ACETAMINOPHEN 5-325 MG PO TABS
1.0000 | ORAL_TABLET | ORAL | Status: DC | PRN
  Administered 2014-08-17 – 2014-08-19 (×6): 1 via ORAL
  Filled 2014-08-16 (×6): qty 1

## 2014-08-16 MED ORDER — ONDANSETRON HCL 4 MG PO TABS: 4.0000 mg | ORAL_TABLET | Freq: Four times a day (QID) | ORAL | Status: DC | PRN

## 2014-08-16 MED ORDER — MORPHINE SULFATE 4 MG/ML IJ SOLN
4.0000 mg | Freq: Once | INTRAMUSCULAR | Status: AC
  Administered 2014-08-16: 4 mg via INTRAVENOUS
  Filled 2014-08-16: qty 1

## 2014-08-16 MED ORDER — METRONIDAZOLE IN NACL 5-0.79 MG/ML-% IV SOLN: 500.0000 mg | Freq: Three times a day (TID) | INTRAVENOUS | Status: DC

## 2014-08-16 MED ORDER — CIPROFLOXACIN IN D5W 400 MG/200ML IV SOLN
400.0000 mg | Freq: Two times a day (BID) | INTRAVENOUS | Status: DC
  Administered 2014-08-17 – 2014-08-20 (×8): 400 mg via INTRAVENOUS
  Filled 2014-08-16 (×10): qty 200

## 2014-08-16 MED ORDER — METRONIDAZOLE IN NACL 5-0.79 MG/ML-% IV SOLN
500.0000 mg | Freq: Once | INTRAVENOUS | Status: DC
  Filled 2014-08-16 (×2): qty 100

## 2014-08-16 MED ORDER — IOPAMIDOL (ISOVUE-300) INJECTION 61%
125.0000 mL | Freq: Once | INTRAVENOUS | Status: AC | PRN
  Administered 2014-08-16: 125 mL via INTRAVENOUS

## 2014-08-16 MED ORDER — METRONIDAZOLE IN NACL 5-0.79 MG/ML-% IV SOLN
500.0000 mg | Freq: Three times a day (TID) | INTRAVENOUS | Status: DC
  Administered 2014-08-16 – 2014-08-21 (×14): 500 mg via INTRAVENOUS
  Filled 2014-08-16 (×16): qty 100

## 2014-08-16 MED ORDER — ONDANSETRON HCL 4 MG/2ML IJ SOLN: 4.0000 mg | Freq: Four times a day (QID) | INTRAMUSCULAR | Status: DC | PRN

## 2014-08-16 NOTE — Progress Notes (Signed)
ANTIBIOTIC CONSULT NOTE - INITIAL  Pharmacy Consult for Cipro Indication: intra-abdominal infection  No Known Allergies  Patient Measurements:   Adjusted Body Weight:   Vital Signs: Temp: 98.9 F (37.2 C) (04/29 1836) BP: 126/68 mmHg (04/29 2200) Pulse Rate: 56 (04/29 2200) Intake/Output from previous day:   Intake/Output from this shift:    Labs:  Recent Labs  08/16/14 1926  WBC 11.4*  HGB 12.8*  PLT 367  CREATININE 1.21   CrCl cannot be calculated (Unknown ideal weight.). No results for input(s): VANCOTROUGH, VANCOPEAK, VANCORANDOM, GENTTROUGH, GENTPEAK, GENTRANDOM, TOBRATROUGH, TOBRAPEAK, TOBRARND, AMIKACINPEAK, AMIKACINTROU, AMIKACIN in the last 72 hours.   Microbiology: No results found for this or any previous visit (from the past 720 hour(s)).  Medical History: Past Medical History  Diagnosis Date  . Hypertension   . Diverticulitis     Medications:  Prescriptions prior to admission  Medication Sig Dispense Refill Last Dose  . aspirin EC 81 MG tablet Take 81 mg by mouth daily.   08/16/2014 at Unknown time  . ciprofloxacin (CIPRO) 500 MG tablet Take 1 tablet (500 mg total) by mouth 2 (two) times daily. One po bid x 10 days 20 tablet 0 08/16/2014 at Unknown time  . FIBER PO Take 1 tablet by mouth daily.   past 2 weeks  . lisinopril-hydrochlorothiazide (PRINZIDE,ZESTORETIC) 20-12.5 MG per tablet Take 2 tablets by mouth daily.  5 08/16/2014 at Unknown time  . meloxicam (MOBIC) 15 MG tablet Take 15 mg by mouth 2 (two) times daily as needed for pain.   2 Past Month at Unknown time  . metroNIDAZOLE (FLAGYL) 500 MG tablet Take 500 mg by mouth 2 (two) times daily. Started 08/14/14, for 10 days ending 08/23/14   08/16/2014 at Unknown time  . oxyCODONE-acetaminophen (PERCOCET) 5-325 MG per tablet Take 1 tablet by mouth every 4 (four) hours as needed for moderate pain. 20 tablet 0 Past Week at Unknown time  . vitamin B-12 (CYANOCOBALAMIN) 1000 MCG tablet Take 1,000 mcg by  mouth daily.   08/16/2014 at Unknown time  . metroNIDAZOLE (FLAGYL) 500 MG tablet Take 1 tablet (500 mg total) by mouth 3 (three) times daily. One po bid x 10 days 30 tablet 0   . naproxen (NAPROSYN) 500 MG tablet Take 1 tablet (500 mg total) by mouth 2 (two) times daily. 30 tablet 0    Scheduled:  . [START ON 08/17/2014] aspirin EC  81 mg Oral Daily  . enoxaparin (LOVENOX) injection  40 mg Subcutaneous Q24H  . metronidazole  500 mg Intravenous Once  . metronidazole  500 mg Intravenous Q8H  . sodium chloride  3 mL Intravenous Q12H  . [START ON 08/17/2014] vitamin B-12  1,000 mcg Oral Daily   Infusions:  . sodium chloride 100 mL/hr at 08/16/14 2137   Assessment: 54yo male with history of HTN and diverticulitis presents with abdominal pain. Pharmacy is consulted to dose cipro for intra-abdominal infection. Pt is afebrile, WBC 11.4, sCr 1.2.  Goal of Therapy:  Eradication of infection  Plan:  Cipro 400mg  IV q12h Follow up culture results, renal function, and clinical course  Arlean HoppingCorey M. Newman PiesBall, PharmD Clinical Pharmacist Pager [phone] 08/16/2014,10:31 PM

## 2014-08-16 NOTE — ED Notes (Signed)
Attempted to call report

## 2014-08-16 NOTE — ED Provider Notes (Signed)
CSN: [id number]     Arrival date & time 08/16/14  1820 History   First MD Initiated Contact with Patient 08/16/14 1955     Chief Complaint  Patient presents with  . Abdominal Pain     (Consider location/radiation/quality/duration/timing/severity/associated sxs/prior Treatment) The history is provided by the patient and medical records.    This is a 54 year old male with past medical history significant for hypertension and diverticulitis, presenting to the ED for abdominal pain. [facility] was [facility] by [facility] earlier in the week and sent for outpatient CT scan today which revealed acute diverticulitis with perforation. He was notified and encouraged to come to the ED.  He states his pain has greatly improved from earlier in the week.  Denies any current nausea or vomiting, or diarrhea. No fever or chills.  Has been taking cipro and flagyl at home.  His GI physician, Dr. Bosie ClosSchooler, aware of patient status.  VSS.  Past Medical History  Diagnosis Date  . Hypertension   . Diverticulitis    Past Surgical History  Procedure Laterality Date  . Hammer toe surgery     No family history on file. History  Substance Use Topics  . Smoking status: Current Every Day Smoker -- 0.25 packs/day    Types: Cigarettes  . Smokeless tobacco: Not on file  . Alcohol Use: 16.2 oz/week    6 Cans of beer, 21 Shots of liquor per week    Review of Systems  Gastrointestinal: Positive for abdominal pain.  All other systems reviewed and are negative.     Allergies  Review of patient's allergies indicates no known allergies.  Home Medications   Prior to Admission medications   Medication Sig Start Date End Date Taking? Authorizing Provider  aspirin EC 81 MG tablet Take 81 mg by mouth daily.    Historical Provider, MD  ciprofloxacin (CIPRO) 500 MG tablet Take 1 tablet (500 mg total) by mouth 2 (two) times daily. One po bid x 10 days Patient not taking: Reported on 04/13/2014 11/04/13   April  Palumbo, MD  FIBER PO Take 1 tablet by mouth daily.    Historical Provider, MD  lisinopril-hydrochlorothiazide (PRINZIDE,ZESTORETIC) 20-25 MG per tablet Take 1 tablet by mouth daily.    Historical Provider, MD  metroNIDAZOLE (FLAGYL) 500 MG tablet Take 1 tablet (500 mg total) by mouth 3 (three) times daily. One po bid x 10 days Patient not taking: Reported on 04/13/2014 11/04/13   April Palumbo, MD  naproxen (NAPROSYN) 500 MG tablet Take 1 tablet (500 mg total) by mouth 2 (two) times daily. 04/13/14   Dione Boozeavid Glick, MD  oxyCODONE-acetaminophen (PERCOCET) 5-325 MG per tablet Take 1 tablet by mouth every 4 (four) hours as needed for moderate pain. 04/13/14   Dione Boozeavid Glick, MD  vitamin B-12 (CYANOCOBALAMIN) 1000 MCG tablet Take 1,000 mcg by mouth daily.    Historical Provider, MD   BP 137/82 mmHg  Pulse 69  Temp(Src) 98.9 F (37.2 C)  Resp 18  SpO2 97%   Physical Exam  Constitutional: He is oriented to person, place, and time. He appears well-developed and well-nourished. No distress.  HENT:  Head: Normocephalic and atraumatic.  Mouth/Throat: Oropharynx is clear and moist.  Eyes: Conjunctivae and EOM are normal. Pupils are equal, round, and reactive to light.  Neck: Normal range of motion. Neck supple.  Cardiovascular: Normal rate, regular rhythm and normal heart sounds.   Pulmonary/Chest: Effort normal and breath sounds normal. No respiratory distress. He has no wheezes.  Abdominal:  Soft. Bowel sounds are normal. There is tenderness (mild, LLQ). There is no guarding.  Musculoskeletal: Normal range of motion.  Neurological: He is alert and oriented to person, place, and time.  Skin: Skin is warm and dry. He is not diaphoretic.  Psychiatric: He has a normal mood and affect.  Nursing note and vitals reviewed.   ED Course  Procedures (including critical care time) Labs Review Labs Reviewed  CBC WITH DIFFERENTIAL/PLATELET - Abnormal; Notable for the following:    WBC 11.4 (*)    RBC 3.85  (*)    Hemoglobin 12.8 (*)    HCT 37.6 (*)    All other components within normal limits  COMPREHENSIVE METABOLIC PANEL - Abnormal; Notable for the following:    GFR calc non Af Amer 66 (*)    GFR calc Af Amer 77 (*)    All other components within normal limits  LIPASE, BLOOD    Imaging Review Ct Abdomen Pelvis W Contrast  08/16/2014   CLINICAL DATA:  Left lower quadrant pain x1.5 weeks  EXAM: CT ABDOMEN AND PELVIS WITH CONTRAST  TECHNIQUE: Multidetector CT imaging of the abdomen and pelvis was performed using the standard protocol following bolus administration of intravenous contrast.  CONTRAST:  125 mL Isovue 300 IV  COMPARISON:  11/04/2013  FINDINGS: Lower chest:  Lung bases are clear.  Coronary atherosclerosis.  Hepatobiliary: 6 mm hypervascular lesion inferiorly in the posterior segment right hepatic lobe (series 3/ image 27), technically indeterminate but suggestive of a flash filling hemangioma.  Gallbladder is unremarkable. No intrahepatic or extrahepatic ductal dilatation.  Pancreas: Within normal limits.  Spleen: Within normal limits.  Adrenals/Urinary Tract: Adrenal glands are within normal limits.  Kidneys are within normal limits.  No hydronephrosis.  Bladder is within normal limits.  Stomach/Bowel: Stomach is within normal limits.  No evidence of bowel obstruction.  Normal appendix.  Sigmoid colonic wall thickening/ inflammatory changes (series 3/image 74), suggesting sigmoid diverticulitis.  Associated localized, irregular gas collection in the lower abdomen measuring 9.6 x 6.3 x 5.6 cm (series 3/ image 65), suggesting localized macroscopic perforation. Minimal layering fluid dependently within one portion of the collection (series 3/image 69). Overall collection reflects multiple smaller air-filled spaces and does not have a single well-defined rim.  No free air.  Vascular/Lymphatic: Atherosclerotic calcifications of the abdominal aorta and branch vessels.  Small upper abdominal/  retroperitoneal lymph nodes which do not meet pathologic CT size criteria, likely reactive.  Reproductive: Prostate is unremarkable.  Other: No abdominopelvic ascites.  Musculoskeletal: Degenerative changes of the visualized thoracolumbar spine.  IMPRESSION: Sigmoid colonic wall thickening/ inflammatory changes, suggesting sigmoid diverticulitis. Consider follow-up colonoscopy as clinically warranted.  Associated 9.6 x 6.3 x 5.6 cm irregular gas collection in the lower abdomen, as described above, suggesting localized macroscopic perforation. No free air.   Electronically Signed   By: Charline Bills M.D.   On: 08/16/2014 14:06     EKG Interpretation None      MDM   Final diagnoses:  Diverticulitis of large intestine with perforation without bleeding   54 year old male sent here for outpatient CT which shows diverticulitis with acute perforation. There is no noted abscess formation. Patient is currently afebrile and nontoxic in appearance. He has mild tenderness in his left lower quadrant without peritonitis. IV Cipro and Flagyl was started. Dr. Bosie Clos, patient's GI physician, has evaluated him in the ED-- does not feel that he needs surgery at this time, rather plan will be for bowel rest and IV abx  with close monitoring.  Dr. Janee Morn of general surgery was made aware of patient stats-- he will be notified by GI or hospitalist service if any complications arise for formal consult.  Patient admitted to Dr. Allena Katz of [facility] for further management.  Temp admission orders placed.  Garlon Hatchet, PA-C 08/16/14 2202  Geoffery Lyons, MD 08/16/14 2250

## 2014-08-16 NOTE — Progress Notes (Signed)
Received report from Chrislyn, RN in ED to transport pt to [id number].

## 2014-08-16 NOTE — Consult Note (Signed)
Referring Provider: Dr. Judd Lienelo Primary Care Physician:  Lenora BoysFRIED, ROBERT L, MD Primary Gastroenterologist:  Dr. Bosie ClosSchooler  Reason for Consultation:  Diverticulitis  HPI: Jonathan Graves is a 54 y.o. male with a history of diverticulitis, who was seen in the office on 08/12/14 due to abdominal pain that started about a week prior to that visit that was sharp in the LLQ without radiation of the pain. Pain was 8/10 at onset and mainly has been a 4/10 in intensity. Subjective fevers and severe night sweats for the past week. Denies N/V/D/chills. BMs once per day but have been more pasty last week. No appetite last week until yesterday and today. Started on Cipro/Flagyl on 08/12/14 and felt great yesterday. Denies hematochezia, melena, hematemesis, dizziness, weakness. Severe gas today but ate a cheeseburger for lunch today. Has lost 10 pounds unintentionally over the past week. WBC 13.3 and Hgb 13.1 on 08/12/14. Last episode of diverticulitis occurred in 2011. In July 2015 he had an episode that was thought to be ischemic colitis vs diverticulitis. Colonoscopy in October 2015 showed mild inflammation of the sigmoid colon with surrounding diverticulae and was otherwise normal. Outpt CT scan showed a large fluid/gas collection measuring 9.6 X 6.3 X 5.6 cm in the lower abdomen and sigmoid diverticulitis. No free air seen. Wife at bedside.   Past Medical History  Diagnosis Date  . Hypertension   . Diverticulitis   Hyperlipidemia  Past Surgical History  Procedure Laterality Date  . Hammer toe surgery    Tendon reattachment in right arm  Prior to Admission medications   Medication Sig Start Date End Date Taking? Authorizing Provider  aspirin EC 81 MG tablet Take 81 mg by mouth daily.   Yes Historical Provider, MD  ciprofloxacin (CIPRO) 500 MG tablet Take 1 tablet (500 mg total) by mouth 2 (two) times daily. One po bid x 10 days 11/04/13  Yes April Palumbo, MD  FIBER PO Take 1 tablet by mouth daily.   Yes  Historical Provider, MD  lisinopril-hydrochlorothiazide (PRINZIDE,ZESTORETIC) 20-25 MG per tablet Take 1 tablet by mouth daily.   Yes Historical Provider, MD  oxyCODONE-acetaminophen (PERCOCET) 5-325 MG per tablet Take 1 tablet by mouth every 4 (four) hours as needed for moderate pain. 04/13/14  Yes Dione Boozeavid Glick, MD  vitamin B-12 (CYANOCOBALAMIN) 1000 MCG tablet Take 1,000 mcg by mouth daily.   Yes Historical Provider, MD  metroNIDAZOLE (FLAGYL) 500 MG tablet Take 1 tablet (500 mg total) by mouth 3 (three) times daily. One po bid x 10 days Patient not taking: Reported on 04/13/2014 11/04/13   April Palumbo, MD  naproxen (NAPROSYN) 500 MG tablet Take 1 tablet (500 mg total) by mouth 2 (two) times daily. 04/13/14   Dione Boozeavid Glick, MD    Scheduled Meds: . morphine  4 mg Intravenous Once   Continuous Infusions: . ciprofloxacin    . metronidazole     PRN Meds:.  Allergies as of 08/16/2014  . (No Known Allergies)    No family history on file.  History   Social History  . Marital Status: Married    Spouse Name: N/A  . Number of Children: N/A  . Years of Education: N/A   Occupational History  . Not on file.   Social History Main Topics  . Smoking status: Current Every Day Smoker -- 0.25 packs/day    Types: Cigarettes  . Smokeless tobacco: Not on file  . Alcohol Use: 16.2 oz/week    6 Cans of beer, 21 Shots of liquor per  week  . Drug Use: No  . Sexual Activity: Not on file   Other Topics Concern  . Not on file   Social History Narrative    Review of Systems: Positive in bold: Gen: Denies any fever, chills, rigors, night sweats, anorexia, fatigue, weakness, malaise, involuntary weight loss, and sleep disorder CV: Denies chest pain, angina Resp: Denies dyspnea GI: negative except as stated in HPI GU : Denies urinary changes  MS: Denies muscle aches Derm: Denies rash Heme: Denies bruising Neuro:  Denies any headaches, dizziness Endo:  Denies any problems with DM, thyroid,  adrenal function.  Physical Exam: Vital signs in last 24 hours: Temp:  [98.9 F (37.2 C)] 98.9 F (37.2 C) (04/29 1836) Pulse Rate:  [69] 69 (04/29 1836) Resp:  [18] 18 (04/29 1836) BP: (137)/(82) 137/82 mmHg (04/29 1836) SpO2:  [97 %] 97 % (04/29 1836)   General:   Alert,  Well-developed, well-nourished, pleasant and cooperative in NAD Head:  Normocephalic and atraumatic. Eyes:  Sclera clear, no icterus.   Conjunctiva pink. Ears:  Normal auditory acuity. Nose:  No deformity Mouth:  No deformity or lesions.  Oropharynx pink & moist. Neck:  Supple; no masses or thyromegaly. Lungs:  Clear throughout to auscultation.   No wheezes, crackles, or rhonchi. No acute distress. Heart:  Regular rate and rhythm; no murmurs, clicks, rubs, or gallops. Abdomen:  LLQ tenderness with guarding, otherwise nontender, soft, nondistended, +BS   Rectal:  Deferred Msk:  Symmetrical without gross deformities. Pulses:  Normal pulses noted. Extremities:  Without clubbing or edema. Neurologic:  Alert and  oriented x4;  grossly normal neurologically. Skin:  Intact without significant lesions or rashes. Psych:  Alert and cooperative. Normal mood and affect.   Lab Results:  Recent Labs  08/16/14 1926  WBC 11.4*  HGB 12.8*  HCT 37.6*  PLT 367   BMET  Recent Labs  08/16/14 1926  NA 139  K 4.6  CL 104  CO2 24  GLUCOSE 83  BUN 12  CREATININE 1.21  CALCIUM 9.1   LFT Lab Results  Component Value Date   ALT 27 08/16/2014   AST 23 08/16/2014   ALKPHOS 48 08/16/2014   BILITOT 0.5 08/16/2014    Recent Labs  08/16/14 1926  PROT 7.4  ALBUMIN 3.5  AST 23  ALT 27  ALKPHOS 48  BILITOT 0.5   PT/INR No results for input(s): LABPROT, INR in the last 72 hours.   Studies/Results: Ct Abdomen Pelvis W Contrast  08/16/2014   CLINICAL DATA:  Left lower quadrant pain x1.5 weeks  EXAM: CT ABDOMEN AND PELVIS WITH CONTRAST  TECHNIQUE: Multidetector CT imaging of the abdomen and pelvis was  performed using the standard protocol following bolus administration of intravenous contrast.  CONTRAST:  125 mL Isovue 300 IV  COMPARISON:  11/04/2013  FINDINGS: Lower chest:  Lung bases are clear.  Coronary atherosclerosis.  Hepatobiliary: 6 mm hypervascular lesion inferiorly in the posterior segment right hepatic lobe (series 3/ image 27), technically indeterminate but suggestive of a flash filling hemangioma.  Gallbladder is unremarkable. No intrahepatic or extrahepatic ductal dilatation.  Pancreas: Within normal limits.  Spleen: Within normal limits.  Adrenals/Urinary Tract: Adrenal glands are within normal limits.  Kidneys are within normal limits.  No hydronephrosis.  Bladder is within normal limits.  Stomach/Bowel: Stomach is within normal limits.  No evidence of bowel obstruction.  Normal appendix.  Sigmoid colonic wall thickening/ inflammatory changes (series 3/image 74), suggesting sigmoid diverticulitis.  Associated localized, irregular  gas collection in the lower abdomen measuring 9.6 x 6.3 x 5.6 cm (series 3/ image 65), suggesting localized macroscopic perforation. Minimal layering fluid dependently within one portion of the collection (series 3/image 69). Overall collection reflects multiple smaller air-filled spaces and does not have a single well-defined rim.  No free air.  Vascular/Lymphatic: Atherosclerotic calcifications of the abdominal aorta and branch vessels.  Small upper abdominal/ retroperitoneal lymph nodes which do not meet pathologic CT size criteria, likely reactive.  Reproductive: Prostate is unremarkable.  Other: No abdominopelvic ascites.  Musculoskeletal: Degenerative changes of the visualized thoracolumbar spine.  IMPRESSION: Sigmoid colonic wall thickening/ inflammatory changes, suggesting sigmoid diverticulitis. Consider follow-up colonoscopy as clinically warranted.  Associated 9.6 x 6.3 x 5.6 cm irregular gas collection in the lower abdomen, as described above, suggesting  localized macroscopic perforation. No free air.   Electronically Signed   By: Charline Bills M.D.   On: 08/16/2014 14:06    Impression/Plan: 54 yo with sigmoid diverticulitis and a large abscess near the sigmoid colon that likely will need percutaneous drain in the near future. I do not think he needs surgery at this time but if this abscess cannot be drained by IR then he may need surgery for it. Will do IV Abx, bowel rest, IVFs, supportive care. Will follow in consultation.     Edmonia Gonser C.  08/16/2014, 8:57 PM

## 2014-08-16 NOTE — ED Notes (Addendum)
Pt. Was sent to [facility] by [facility] for admission by Hospitalist. For Diverticulitis.  Pt. Had a CT scan done and showed perforation in the LLQ. Pt. Is to be admitted by hospitalist. Pt. Denies any n/v/d. Skin is p/w/d,  GCS 15

## 2014-08-16 NOTE — ED Notes (Signed)
MD at bedside. 

## 2014-08-16 NOTE — ED Notes (Signed)
Pt is [facility] at [facility]. Has been taking antibiotics for 4 days for diverticulitis. Had CT scan today and showed perforation to LLQ. Dr. Bosie ClosSchooler is aware of pt, wants pt admitted for IV antibiotics by hospitalist.

## 2014-08-17 ENCOUNTER — Encounter (HOSPITAL_COMMUNITY): Payer: Self-pay | Admitting: Radiology

## 2014-08-17 DIAGNOSIS — I10 Essential (primary) hypertension: Secondary | ICD-10-CM | POA: Diagnosis present

## 2014-08-17 DIAGNOSIS — Z7289 Other problems related to lifestyle: Secondary | ICD-10-CM | POA: Diagnosis present

## 2014-08-17 DIAGNOSIS — Z789 Other specified health status: Secondary | ICD-10-CM | POA: Diagnosis present

## 2014-08-17 LAB — CBC
HCT: 34.9 % — ABNORMAL LOW (ref 39.0–52.0)
Hemoglobin: 11.8 g/dL — ABNORMAL LOW (ref 13.0–17.0)
MCH: 33.1 pg (ref 26.0–34.0)
MCHC: 33.8 g/dL (ref 30.0–36.0)
MCV: 97.8 fL (ref 78.0–100.0)
PLATELETS: 299 10*3/uL (ref 150–400)
RBC: 3.57 MIL/uL — AB (ref 4.22–5.81)
RDW: 13.1 % (ref 11.5–15.5)
WBC: 9.1 10*3/uL (ref 4.0–10.5)

## 2014-08-17 LAB — COMPREHENSIVE METABOLIC PANEL
ALT: 20 U/L (ref 0–53)
AST: 20 U/L (ref 0–37)
Albumin: 2.8 g/dL — ABNORMAL LOW (ref 3.5–5.2)
Alkaline Phosphatase: 38 U/L — ABNORMAL LOW (ref 39–117)
Anion gap: 7 (ref 5–15)
BILIRUBIN TOTAL: 0.4 mg/dL (ref 0.3–1.2)
BUN: 9 mg/dL (ref 6–23)
CALCIUM: 8.2 mg/dL — AB (ref 8.4–10.5)
CHLORIDE: 107 mmol/L (ref 96–112)
CO2: 25 mmol/L (ref 19–32)
CREATININE: 1.21 mg/dL (ref 0.50–1.35)
GFR calc Af Amer: 77 mL/min — ABNORMAL LOW (ref >90)
GFR calc non Af Amer: 66 mL/min — ABNORMAL LOW (ref >90)
GLUCOSE: 98 mg/dL (ref 70–99)
POTASSIUM: 4.3 mmol/L (ref 3.5–5.1)
SODIUM: 139 mmol/L (ref 135–145)
Total Protein: 6 g/dL (ref 6.0–8.3)

## 2014-08-17 LAB — PROTIME-INR
INR: 1.18 (ref 0.00–1.49)
PROTHROMBIN TIME: 15.2 s (ref 11.6–15.2)

## 2014-08-17 MED ORDER — BOOST / RESOURCE BREEZE PO LIQD
1.0000 | Freq: Two times a day (BID) | ORAL | Status: DC
  Administered 2014-08-17 – 2014-08-20 (×4): 1 via ORAL

## 2014-08-17 MED ORDER — ACETAMINOPHEN 325 MG PO TABS
650.0000 mg | ORAL_TABLET | Freq: Once | ORAL | Status: AC
  Administered 2014-08-17: 650 mg via ORAL
  Filled 2014-08-17: qty 2

## 2014-08-17 MED ORDER — VITAMIN B-1 100 MG PO TABS
100.0000 mg | ORAL_TABLET | Freq: Every day | ORAL | Status: DC
  Administered 2014-08-17 – 2014-08-21 (×4): 100 mg via ORAL
  Filled 2014-08-17 (×5): qty 1

## 2014-08-17 NOTE — Progress Notes (Signed)
Notified Claiborne Billingsallahan, NP that pt requesting clear liquid till midnight. Pt having pain 2 in abdomen. Claiborne Billingsallahan, NP ordered tynenol 650mg  pox1 and clear liquid diet till midnight then NPO after midnight. Will continue to monitor pt. Nelda MarseilleJenny Thacker, RN

## 2014-08-17 NOTE — H&P (Signed)
[facility] History and Physical  Patient: Jonathan Graves  MRN: [id number]  DOB: Nov 10, 1960  DOS: the patient was seen and examined on 08/16/2014 PCP: Lenora Boys, MD  Referring physician: Dr. Bosie Clos Chief Complaint: abdominal pain  HPI: Jonathan Graves is a 54 y.o. male with Past medical history of hypertension. The patient is presenting with complaints of left lower quadrant abdominal pain ongoing for last 1 week. The pain initially started as a crampy pain and it was later on a continuous pain. The patient does have history of diverticulitis as well as a sigmoid colitis. Therefore he has been following up with the gastroenterology. He called his gastroenterologist who recommended him to be on Cipro and Flagyl. After taking this antibiotic the patient was initially feeling better. Yesterday the patient started having complaints of pain again. Since his symptoms were worsening a CT scan was obtained as an outpatient which showed perforation with abscess. The patient was recommended to be admitted to the hospital. At the time of my evaluation patient mentions his pain is manageable he denies any diarrhea denies any nausea vomiting denies any active bleeding denies any chest pain shortness of breath headache or focal deficit or burning urination. Patient mentions he drinks on and off but when he drinks he drinks 3-4 beers. He does not have any drink in last 1 week.  The patient is coming from home. And at his baseline independent for most of his ADL.  Review of Systems: as mentioned in the history of present illness.  A comprehensive review of the other systems is negative.  Past Medical History  Diagnosis Date  . Hypertension   . Diverticulitis    Past Surgical History  Procedure Laterality Date  . Hammer toe surgery     Social History:  reports that he has been smoking Cigarettes.  He has been smoking about 0.25 packs per day. He does not have any smokeless tobacco  history on file. He reports that he drinks about 16.2 oz of alcohol per week. He reports that he does not use illicit drugs.  No Known Allergies  Family History  Problem Relation Age of Onset  . Gastric cancer Mother   . Breast cancer Mother   . Hypertension Father     Prior to Admission medications   Medication Sig Start Date End Date Taking? Authorizing Provider  aspirin EC 81 MG tablet Take 81 mg by mouth daily.   Yes Historical Provider, MD  ciprofloxacin (CIPRO) 500 MG tablet Take 1 tablet (500 mg total) by mouth 2 (two) times daily. One po bid x 10 days 11/04/13  Yes April Palumbo, MD  FIBER PO Take 1 tablet by mouth daily.   Yes Historical Provider, MD  lisinopril-hydrochlorothiazide (PRINZIDE,ZESTORETIC) 20-12.5 MG per tablet Take 2 tablets by mouth daily. 07/29/14  Yes Historical Provider, MD  meloxicam (MOBIC) 15 MG tablet Take 15 mg by mouth 2 (two) times daily as needed for pain.  08/08/14  Yes Historical Provider, MD  metroNIDAZOLE (FLAGYL) 500 MG tablet Take 500 mg by mouth 2 (two) times daily. Started 08/14/14, for 10 days ending 08/23/14   Yes Historical Provider, MD  oxyCODONE-acetaminophen (PERCOCET) 5-325 MG per tablet Take 1 tablet by mouth every 4 (four) hours as needed for moderate pain. 04/13/14  Yes Dione Booze, MD  vitamin B-12 (CYANOCOBALAMIN) 1000 MCG tablet Take 1,000 mcg by mouth daily.   Yes Historical Provider, MD  metroNIDAZOLE (FLAGYL) 500 MG tablet Take 1 tablet (500 mg total) by mouth  3 (three) times daily. One po bid x 10 days 11/04/13   April Palumbo, MD  naproxen (NAPROSYN) 500 MG tablet Take 1 tablet (500 mg total) by mouth 2 (two) times daily. 04/13/14   Dione Booze, MD    Physical Exam: Filed Vitals:   08/16/14 2200 08/16/14 2244 08/17/14 0505 08/17/14 0554  BP: 126/68 149/81  96/52  Pulse: 56 63  49  Temp:  98.6 F (37 C)  97.7 F (36.5 C)  TempSrc:  Oral  Oral  Resp: Height:   (1.854 m)    Weight:  103.6 kg (228 lb 6.3 oz) 103.6  kg (228 lb 6.3 oz)   SpO2: 96% 100%  97%    General: Alert, Awake and Oriented to Time, Place and Person. Appear in mild distress Eyes: PERRL ENT: Oral Mucosa clear moist. Neck: no JVD Cardiovascular: S1 and S2 Present, no Murmur, Peripheral Pulses Present Respiratory: Bilateral Air entry equal and Decreased,  Abdomen: Bowel Sound present, Soft and left-sided tender Skin: no Rash Extremities: no Pedal edema, no calf tenderness Neurologic: Grossly no focal neuro deficit.  Labs on Admission:  CBC:  Recent Labs Lab 08/16/14 1926  WBC 11.4*  NEUTROABS 6.2  HGB 12.8*  HCT 37.6*  MCV 97.7  PLT 367    CMP     Component Value Date/Time   NA 139 08/16/2014 1926   K 4.6 08/16/2014 1926   CL 104 08/16/2014 1926   CO2 24 08/16/2014 1926   GLUCOSE 83 08/16/2014 1926   BUN 12 08/16/2014 1926   CREATININE 1.21 08/16/2014 1926   CALCIUM 9.1 08/16/2014 1926   PROT 7.4 08/16/2014 1926   ALBUMIN 3.5 08/16/2014 1926   AST 23 08/16/2014 1926   ALT 27 08/16/2014 1926   ALKPHOS 48 08/16/2014 1926   BILITOT 0.5 08/16/2014 1926   GFRNONAA 66* 08/16/2014 1926   GFRAA 77* 08/16/2014 1926     Recent Labs Lab 08/16/14 1926  LIPASE 33    No results for input(s): CKTOTAL, CKMB, CKMBINDEX, TROPONINI in the last 168 hours. BNP (last 3 results) No results for input(s): BNP in the last 8760 hours.  ProBNP (last 3 results) No results for input(s): PROBNP in the last 8760 hours.   Radiological Exams on Admission: Ct Abdomen Pelvis W Contrast  08/16/2014   CLINICAL DATA:  Left lower quadrant pain x1.5 weeks  EXAM: CT ABDOMEN AND PELVIS WITH CONTRAST  TECHNIQUE: Multidetector CT imaging of the abdomen and pelvis was performed using the standard protocol following bolus administration of intravenous contrast.  CONTRAST:  125 mL Isovue 300 IV  COMPARISON:  11/04/2013  FINDINGS: Lower chest:  Lung bases are clear.  Coronary atherosclerosis.  Hepatobiliary: 6 mm hypervascular lesion  inferiorly in the posterior segment right hepatic lobe (series 3/ image 27), technically indeterminate but suggestive of a flash filling hemangioma.  Gallbladder is unremarkable. No intrahepatic or extrahepatic ductal dilatation.  Pancreas: Within normal limits.  Spleen: Within normal limits.  Adrenals/Urinary Tract: Adrenal glands are within normal limits.  Kidneys are within normal limits.  No hydronephrosis.  Bladder is within normal limits.  Stomach/Bowel: Stomach is within normal limits.  No evidence of bowel obstruction.  Normal appendix.  Sigmoid colonic wall thickening/ inflammatory changes (series 3/image 74), suggesting sigmoid diverticulitis.  Associated localized, irregular gas collection in the lower abdomen measuring 9.6 x 6.3 x 5.6 cm (series 3/ image 65), suggesting localized macroscopic perforation. Minimal layering fluid dependently within one portion of  the collection (series 3/image 69). Overall collection reflects multiple smaller air-filled spaces and does not have a single well-defined rim.  No free air.  Vascular/Lymphatic: Atherosclerotic calcifications of the abdominal aorta and branch vessels.  Small upper abdominal/ retroperitoneal lymph nodes which do not meet pathologic CT size criteria, likely reactive.  Reproductive: Prostate is unremarkable.  Other: No abdominopelvic ascites.  Musculoskeletal: Degenerative changes of the visualized thoracolumbar spine.  IMPRESSION: Sigmoid colonic wall thickening/ inflammatory changes, suggesting sigmoid diverticulitis. Consider follow-up colonoscopy as clinically warranted.  Associated 9.6 x 6.3 x 5.6 cm irregular gas collection in the lower abdomen, as described above, suggesting localized macroscopic perforation. No free air.   Electronically Signed   By: Charline BillsSriyesh  Krishnan M.D.   On: 08/16/2014 14:06   Assessment/Plan Principal Problem:   Diverticulitis of colon with perforation Active Problems:   Hypertension   Alcohol use   1.  Diverticulitis of colon with perforation The patient is presenting with complaints of abdominal pain. CT scan is showing sigmoid colonic diverticulitis with microperforation as well as possible abscess. Patient was evaluated by GI. Patient was also discussed with general surgery initially. Gastroenterologist does not feel that the patient requires a surgical consultation at present. We will follow GI recommendation. Currently on IV fluids as well as Cipro and Flagyl. Monitor symptomatically. Patient mentions he has daily bowel movement.  2.history of hypertension. Currently holding his blood pressure medications.  3.history of alcohol use. Patient has not had any drink in the last 1 week. Does not appear to be having any withdrawals at present. We give him B1 and B-12. Monitor for withdrawals.  Advance goals of care discussion: full code   Consults: ED physician discussed with gastroenterology as well as general surgery.  DVT Prophylaxis: subcutaneous Heparin Nutrition: nothing by mouth except medications  Disposition: Admitted as inpatient, med-surge unit.  Author: Lynden OxfordPranav Patel, MD [facility] Pager: [phone] 08/16/2014   If 7PM-7AM, please contact night-coverage [email or web address] Password [email or web address]

## 2014-08-17 NOTE — Progress Notes (Signed)
IR PA aware of request for possible pelvic percutaneous drain, images reviewed with Dr. Deanne CofferHassell and no well formed abscess seen. IR may be able to attempt percutaneous drain placement into gas collection, but will await surgical input for now as this is not a straight forward abscess collection. The patient did receive Lovenox today and the procedure would need to be tomorrow 24 hrs from last lovenox dose.  Pattricia BossKoreen Erasmus Bistline PA-C Interventional Radiology  08/17/14  12:00 PM

## 2014-08-17 NOTE — Progress Notes (Addendum)
Patient ID: Jonathan Graves, male   DOB: [birth year], 54 y.o.   MRN: [id number] [facility]  Jonathan Graves 54 y.o. [birth year]   Subjective: Complaining of abdominal pain Passing gas. No BMs. No N/V.  Objective: Vital signs in last 24 hours: Filed Vitals:   08/17/14 0554  BP: 96/52  Pulse: 49  Temp: 97.7 F (36.5 C)  Resp: 16    Physical Exam: Gen: alert, no acute distress CV: RRR Chest: CTA B Abd: LLQ, suprapubic, and RLQ tenderness with guarding, no rebound, nondistended, +BS  Lab Results:  Recent Labs  08/16/14 1926 08/17/14 0616  NA 139 139  K 4.6 4.3  CL 104 107  CO2 24 25  GLUCOSE 83 98  BUN 12 9  CREATININE 1.21 1.21  CALCIUM 9.1 8.2*    Recent Labs  08/16/14 1926 08/17/14 0616  AST 23 20  ALT 27 20  ALKPHOS 48 38*  BILITOT 0.5 0.4  PROT 7.4 6.0  ALBUMIN 3.5 2.8*    Recent Labs  08/16/14 1926 08/17/14 0616  WBC 11.4* 9.1  NEUTROABS 6.2  --   HGB 12.8* 11.8*  HCT 37.6* 34.9*  MCV 97.7 97.8  PLT 367 299    Recent Labs  08/17/14 0616  LABPROT 15.2  INR 1.18      Assessment/Plan: Sigmoid diverticulitis with a large abscess-appearing collection with worsened abdominal pain and more diffuse. No rebound and I do NOT think he needs urgent surgery but he does need a surgical consult today to evaluate further since his abdominal pain has worsened. Also, needs a consult by IR to look at percutaneous drainage of this collection. Continue IV Abx. Will give clear liquids today. NPO p MN. D/W Dr. Rhona Leavenshiu.   Sapphire Tygart C. 08/17/2014, 11:10 AM

## 2014-08-17 NOTE — Progress Notes (Signed)
[facility] PROGRESS NOTE  Jonathan Graves ZOX:[id number] DOB: 05-12-1960 DOA: 08/16/2014 PCP: Lenora Boys, MD  Assessment/Plan: 1. Diverticulitis with perforation 1. GI following 2. Pt is continued on cipro and flagyl 3. Pt with increased abd pain and Surgery and IR consulted per GI recs 4. Appreciate Surgery input, who recommends drainage by IR 5. Plans for IR guided perc tube drainage 2. HTN 1. BP stable and controlled 2. Cont to monitor 3. Hx ETOH abuse 1. Stable thus far 2. No evidence of withdrawals 4. DVT prophylaxis 1. Lovenox  Code Status: Full Family Communication: Pt in room Disposition Plan: Pending   Consultants:  GI  General Surgery  IR  Procedures:    Antibiotics:  Ciprofloxacin 4/30>>>  Flagyl 4/30>>>  HPI/Subjective: Complains of worsened lower abd pain  Objective: Filed Vitals:   08/16/14 2244 08/17/14 0505 08/17/14 0554 08/17/14 1333  BP: 149/81  96/52 104/65  Pulse: 63  49 87  Temp: 98.6 F (37 C)  97.7 F (36.5 C) 98.3 F (36.8 C)  TempSrc: Oral  Oral Oral  Resp: Height:  (1.854 m)     Weight: 103.6 kg (228 lb 6.3 oz) 103.6 kg (228 lb 6.3 oz)    SpO2: 100%  97% 97%    Intake/Output Summary (Last 24 hours) at 08/17/14 1439 Last data filed at 08/17/14 0848  Gross per 24 hour  Intake 1131.67 ml  Output    530 ml  Net 601.67 ml   Filed Weights   08/16/14 2244 08/17/14 0505  Weight: 103.6 kg (228 lb 6.3 oz) 103.6 kg (228 lb 6.3 oz)    Exam:   General:  Awake, in nad  Cardiovascular: regular, s1, s2  Respiratory: normal resp effort, no wheezing  Abdomen: soft, pos BS, generalized abd pain  Musculoskeletal: perfused, no clubbing   Data Reviewed: Basic Metabolic Panel:  Recent Labs Lab 08/16/14 1926 08/17/14 0616  NA 139 139  Graves 4.6 4.3  CL 104 107  CO2 24 25  GLUCOSE 83 98  BUN 12 9  CREATININE 1.21 1.21  CALCIUM 9.1 8.2*   Liver Function Tests:  Recent Labs Lab  08/16/14 1926 08/17/14 0616  AST 23 20  ALT 27 20  ALKPHOS 48 38*  BILITOT 0.5 0.4  PROT 7.4 6.0  ALBUMIN 3.5 2.8*    Recent Labs Lab 08/16/14 1926  LIPASE 33   No results for input(s): AMMONIA in the last 168 hours. CBC:  Recent Labs Lab 08/16/14 1926 08/17/14 0616  WBC 11.4* 9.1  NEUTROABS 6.2  --   HGB 12.8* 11.8*  HCT 37.6* 34.9*  MCV 97.7 97.8  PLT 367 299   Cardiac Enzymes: No results for input(s): CKTOTAL, CKMB, CKMBINDEX, TROPONINI in the last 168 hours. BNP (last 3 results) No results for input(s): BNP in the last 8760 hours.  ProBNP (last 3 results) No results for input(s): PROBNP in the last 8760 hours.  CBG: No results for input(s): GLUCAP in the last 168 hours.  No results found for this or any previous visit (from the past 240 hour(s)).   Studies: Ct Abdomen Pelvis W Contrast  08/16/2014   CLINICAL DATA:  Left lower quadrant pain x1.5 weeks  EXAM: CT ABDOMEN AND PELVIS WITH CONTRAST  TECHNIQUE: Multidetector CT imaging of the abdomen and pelvis was performed using the standard protocol following bolus administration of intravenous contrast.  CONTRAST:  125 mL Isovue 300 IV  COMPARISON:  11/04/2013  FINDINGS: Lower chest:  Lung bases are clear.  Coronary atherosclerosis.  Hepatobiliary: 6 mm hypervascular lesion inferiorly in the posterior segment right hepatic lobe (series 3/ image 27), technically indeterminate but suggestive of a flash filling hemangioma.  Gallbladder is unremarkable. No intrahepatic or extrahepatic ductal dilatation.  Pancreas: Within normal limits.  Spleen: Within normal limits.  Adrenals/Urinary Tract: Adrenal glands are within normal limits.  Kidneys are within normal limits.  No hydronephrosis.  Bladder is within normal limits.  Stomach/Bowel: Stomach is within normal limits.  No evidence of bowel obstruction.  Normal appendix.  Sigmoid colonic wall thickening/ inflammatory changes (series 3/image 74), suggesting sigmoid  diverticulitis.  Associated localized, irregular gas collection in the lower abdomen measuring 9.6 x 6.3 x 5.6 cm (series 3/ image 65), suggesting localized macroscopic perforation. Minimal layering fluid dependently within one portion of the collection (series 3/image 69). Overall collection reflects multiple smaller air-filled spaces and does not have a single well-defined rim.  No free air.  Vascular/Lymphatic: Atherosclerotic calcifications of the abdominal aorta and branch vessels.  Small upper abdominal/ retroperitoneal lymph nodes which do not meet pathologic CT size criteria, likely reactive.  Reproductive: Prostate is unremarkable.  Other: No abdominopelvic ascites.  Musculoskeletal: Degenerative changes of the visualized thoracolumbar spine.  IMPRESSION: Sigmoid colonic wall thickening/ inflammatory changes, suggesting sigmoid diverticulitis. Consider follow-up colonoscopy as clinically warranted.  Associated 9.6 x 6.3 x 5.6 cm irregular gas collection in the lower abdomen, as described above, suggesting localized macroscopic perforation. No free air.   Electronically Signed   By: Charline BillsSriyesh  Krishnan M.D.   On: 08/16/2014 14:06    Scheduled Meds: . aspirin EC  81 mg Oral Daily  . ciprofloxacin  400 mg Intravenous Q12H  . enoxaparin (LOVENOX) injection  40 mg Subcutaneous Daily  . feeding supplement (RESOURCE BREEZE)  1 Container Oral BID BM  . metronidazole  500 mg Intravenous Once  . metronidazole  500 mg Intravenous 3 times per day  . sodium chloride  3 mL Intravenous Q12H  . thiamine  100 mg Oral Daily  . vitamin B-12  1,000 mcg Oral Daily   Continuous Infusions: . sodium chloride 100 mL/hr at 08/17/14 1354    Principal Problem:   Diverticulitis of colon with perforation Active Problems:   Hypertension   Alcohol use    Jonathan Graves  [facility] Pager [phone]. If 7PM-7AM, please contact night-coverage at [email or web address], password [email or web address] 08/17/2014, 2:39 PM  LOS: 1 day

## 2014-08-17 NOTE — Consult Note (Signed)
Reason for Consult:Diverticulitis Referring Physician: Dr. Teena Irani Jonathan Graves is an 54 y.o. male.  HPI: The patient is a 54 year old male who has had a two-week history of abdominal pain. Patient was sent for CT scan yesterday secondary to abdominal pain. Upon evaluation of CT scan the patient was seen to have a large 9 cm pelvic abscess as well as diverticulitis.  Colonoscopy in October 2015 showed mild inflammation of the sigmoid colon with surrounding diverticulae and was otherwise normal.    Past Medical History  Diagnosis Date  . Hypertension   . Diverticulitis     Past Surgical History  Procedure Laterality Date  . Hammer toe surgery      Family History  Problem Relation Age of Onset  . Gastric cancer Mother   . Breast cancer Mother   . Hypertension Father     Social History:  reports that he has been smoking Cigarettes.  He has been smoking about 0.25 packs per day. He does not have any smokeless tobacco history on file. He reports that he drinks about 16.2 oz of alcohol per week. He reports that he does not use illicit drugs.  Allergies: No Known Allergies  Medications: I have reviewed the patient's current medications.  Results for orders placed or performed during the hospital encounter of 08/16/14 (from the past 48 hour(s))  CBC with Differential     Status: Abnormal   Collection Time: 08/16/14  7:26 PM  Result Value Ref Range   WBC 11.4 (H) 4.0 - 10.5 K/uL   RBC 3.85 (L) 4.22 - 5.81 MIL/uL   Hemoglobin 12.8 (L) 13.0 - 17.0 g/dL   HCT 37.6 (L) 39.0 - 52.0 %   MCV 97.7 78.0 - 100.0 fL   MCH 33.2 26.0 - 34.0 pg   MCHC 34.0 30.0 - 36.0 g/dL   RDW 13.1 11.5 - 15.5 %   Platelets 367 150 - 400 K/uL   Neutrophils Relative % 55 43 - 77 %   Neutro Abs 6.2 1.7 - 7.7 K/uL   Lymphocytes Relative 35 12 - 46 %   Lymphs Abs 4.0 0.7 - 4.0 K/uL   Monocytes Relative 8 3 - 12 %   Monocytes Absolute 0.9 0.1 - 1.0 K/uL   Eosinophils Relative 2 0 - 5 %   Eosinophils  Absolute 0.2 0.0 - 0.7 K/uL   Basophils Relative 0 0 - 1 %   Basophils Absolute 0.1 0.0 - 0.1 K/uL  Comprehensive metabolic panel     Status: Abnormal   Collection Time: 08/16/14  7:26 PM  Result Value Ref Range   Sodium 139 135 - 145 mmol/L   Potassium 4.6 3.5 - 5.1 mmol/L   Chloride 104 96 - 112 mmol/L   CO2 24 19 - 32 mmol/L   Glucose, Bld 83 70 - 99 mg/dL   BUN 12 6 - 23 mg/dL   Creatinine, Ser 1.21 0.50 - 1.35 mg/dL   Calcium 9.1 8.4 - 10.5 mg/dL   Total Protein 7.4 6.0 - 8.3 g/dL   Albumin 3.5 3.5 - 5.2 g/dL   AST 23 0 - 37 U/L   ALT 27 0 - 53 U/L   Alkaline Phosphatase 48 39 - 117 U/L   Total Bilirubin 0.5 0.3 - 1.2 mg/dL   GFR calc non Af Amer 66 (L) >90 mL/min   GFR calc Af Amer 77 (L) >90 mL/min    Comment: (NOTE) The eGFR has been calculated using the CKD EPI equation. This calculation  has not been validated in all clinical situations. eGFR's persistently <90 mL/min signify possible Chronic Kidney Disease.    Anion gap 11 5 - 15  Lipase, blood     Status: None   Collection Time: 08/16/14  7:26 PM  Result Value Ref Range   Lipase 33 11 - 59 U/L  Comprehensive metabolic panel     Status: Abnormal   Collection Time: 08/17/14  6:16 AM  Result Value Ref Range   Sodium 139 135 - 145 mmol/L   Potassium 4.3 3.5 - 5.1 mmol/L   Chloride 107 96 - 112 mmol/L   CO2 25 19 - 32 mmol/L   Glucose, Bld 98 70 - 99 mg/dL   BUN 9 6 - 23 mg/dL   Creatinine, Ser 1.21 0.50 - 1.35 mg/dL   Calcium 8.2 (L) 8.4 - 10.5 mg/dL   Total Protein 6.0 6.0 - 8.3 g/dL   Albumin 2.8 (L) 3.5 - 5.2 g/dL   AST 20 0 - 37 U/L   ALT 20 0 - 53 U/L   Alkaline Phosphatase 38 (L) 39 - 117 U/L   Total Bilirubin 0.4 0.3 - 1.2 mg/dL   GFR calc non Af Amer 66 (L) >90 mL/min   GFR calc Af Amer 77 (L) >90 mL/min    Comment: (NOTE) The eGFR has been calculated using the CKD EPI equation. This calculation has not been validated in all clinical situations. eGFR's persistently <90 mL/min signify possible  Chronic Kidney Disease.    Anion gap 7 5 - 15  CBC     Status: Abnormal   Collection Time: 08/17/14  6:16 AM  Result Value Ref Range   WBC 9.1 4.0 - 10.5 K/uL   RBC 3.57 (L) 4.22 - 5.81 MIL/uL   Hemoglobin 11.8 (L) 13.0 - 17.0 g/dL   HCT 34.9 (L) 39.0 - 52.0 %   MCV 97.8 78.0 - 100.0 fL   MCH 33.1 26.0 - 34.0 pg   MCHC 33.8 30.0 - 36.0 g/dL   RDW 13.1 11.5 - 15.5 %   Platelets 299 150 - 400 K/uL  Protime-INR     Status: None   Collection Time: 08/17/14  6:16 AM  Result Value Ref Range   Prothrombin Time 15.2 11.6 - 15.2 seconds   INR 1.18 0.00 - 1.49    Ct Abdomen Pelvis W Contrast  08/16/2014   CLINICAL DATA:  Left lower quadrant pain x1.5 weeks  EXAM: CT ABDOMEN AND PELVIS WITH CONTRAST  TECHNIQUE: Multidetector CT imaging of the abdomen and pelvis was performed using the standard protocol following bolus administration of intravenous contrast.  CONTRAST:  125 mL Isovue 300 IV  COMPARISON:  11/04/2013  FINDINGS: Lower chest:  Lung bases are clear.  Coronary atherosclerosis.  Hepatobiliary: 6 mm hypervascular lesion inferiorly in the posterior segment right hepatic lobe (series 3/ image 27), technically indeterminate but suggestive of a flash filling hemangioma.  Gallbladder is unremarkable. No intrahepatic or extrahepatic ductal dilatation.  Pancreas: Within normal limits.  Spleen: Within normal limits.  Adrenals/Urinary Tract: Adrenal glands are within normal limits.  Kidneys are within normal limits.  No hydronephrosis.  Bladder is within normal limits.  Stomach/Bowel: Stomach is within normal limits.  No evidence of bowel obstruction.  Normal appendix.  Sigmoid colonic wall thickening/ inflammatory changes (series 3/image 74), suggesting sigmoid diverticulitis.  Associated localized, irregular gas collection in the lower abdomen measuring 9.6 x 6.3 x 5.6 cm (series 3/ image 65), suggesting localized macroscopic perforation. Minimal layering fluid  dependently within one portion of the  collection (series 3/image 69). Overall collection reflects multiple smaller air-filled spaces and does not have a single well-defined rim.  No free air.  Vascular/Lymphatic: Atherosclerotic calcifications of the abdominal aorta and branch vessels.  Small upper abdominal/ retroperitoneal lymph nodes which do not meet pathologic CT size criteria, likely reactive.  Reproductive: Prostate is unremarkable.  Other: No abdominopelvic ascites.  Musculoskeletal: Degenerative changes of the visualized thoracolumbar spine.  IMPRESSION: Sigmoid colonic wall thickening/ inflammatory changes, suggesting sigmoid diverticulitis. Consider follow-up colonoscopy as clinically warranted.  Associated 9.6 x 6.3 x 5.6 cm irregular gas collection in the lower abdomen, as described above, suggesting localized macroscopic perforation. No free air.   Electronically Signed   By: Julian Hy M.D.   On: 08/16/2014 14:06    Review of Systems  Constitutional: Negative for weight loss.  HENT: Negative for ear discharge, ear pain, hearing loss and tinnitus.   Eyes: Negative for blurred vision, double vision, photophobia and pain.  Respiratory: Negative for cough, sputum production and shortness of breath.   Cardiovascular: Negative for chest pain.  Gastrointestinal: Positive for abdominal pain. Negative for nausea, vomiting and diarrhea.  Genitourinary: Negative for dysuria, urgency, frequency and flank pain.  Musculoskeletal: Negative for myalgias, back pain, joint pain, falls and neck pain.  Neurological: Negative for dizziness, tingling, sensory change, focal weakness, loss of consciousness and headaches.  Endo/Heme/Allergies: Does not bruise/bleed easily.  Psychiatric/Behavioral: Negative for depression, memory loss and substance abuse. The patient is not nervous/anxious.    Blood pressure 96/52, pulse 49, temperature 97.7 F (36.5 C), temperature source Oral, resp. rate 16, height 6' 1" (1.854 m), weight 103.6 kg (228 lb  6.3 oz), SpO2 97 %. Physical Exam  Vitals reviewed. Constitutional: He is oriented to person, place, and time. He appears well-developed and well-nourished. He is cooperative. No distress. Cervical collar and nasal cannula in place.  HENT:  Head: Normocephalic and atraumatic. Head is without raccoon's eyes, without Battle's sign, without abrasion, without contusion and without laceration.  Right Ear: Hearing, tympanic membrane, external ear and ear canal normal. No lacerations. No drainage or tenderness. No foreign bodies. Tympanic membrane is not perforated. No hemotympanum.  Left Ear: Hearing, tympanic membrane, external ear and ear canal normal. No lacerations. No drainage or tenderness. No foreign bodies. Tympanic membrane is not perforated. No hemotympanum.  Nose: Nose normal. No nose lacerations, sinus tenderness, nasal deformity or nasal septal hematoma. No epistaxis.  Mouth/Throat: Uvula is midline, oropharynx is clear and moist and mucous membranes are normal. No lacerations.  Eyes: Conjunctivae, EOM and lids are normal. Pupils are equal, round, and reactive to light. No scleral icterus.  Neck: Trachea normal. No JVD present. No spinous process tenderness and no muscular tenderness present. Carotid bruit is not present. No thyromegaly present.  Cardiovascular: Normal rate, regular rhythm, normal heart sounds, intact distal pulses and normal pulses.   Respiratory: Effort normal and breath sounds normal. No respiratory distress. He exhibits no tenderness, no bony tenderness, no laceration and no crepitus.  GI: Soft. Normal appearance. He exhibits no distension. Bowel sounds are decreased. There is tenderness (suprpubically). There is no rigidity, no rebound, no guarding and no CVA tenderness.  Musculoskeletal: Normal range of motion. He exhibits no edema or tenderness.  Lymphadenopathy:    He has no cervical adenopathy.  Neurological: He is alert and oriented to person, place, and time. He  has normal strength. No cranial nerve deficit or sensory deficit. GCS eye subscore is 4.  GCS verbal subscore is 5. GCS motor subscore is 6.  Skin: Skin is warm, dry and intact. He is not diaphoretic.  Psychiatric: He has a normal mood and affect. His speech is normal and behavior is normal.    Assessment/Plan: 54 year old male with diverticulitis in abdominal abscess has been followed by Dr. Michail Sermon.  1. I do agree with IR intervention and drain placement.  The patient currently does not have an acute abdomen no acute surgical plans. 2. Patient currently on Cipro and Flagyl. 3. Continue nothing by mouth, and IV fluids. 4. We will follow with you.    Jonathan Jacks., Jonathan Graves 08/17/2014, 11:52 AM

## 2014-08-17 NOTE — Progress Notes (Signed)
INITIAL NUTRITION ASSESSMENT Pt meets criteria for MODERAT MALNUTRITION in the context of ACUTE ILLNESS as evidenced by an estimated intake of <75% estimated needs for > 7 days and an estimated weight loss of 1% bw in 1 week  DOCUMENTATION CODES Per approved criteria  -Non-severe (moderate) malnutrition in the context of acute illness or injury -Obesity Unspecified   INTERVENTION: -Resource Breeze po BID, each supplement provides 250 kcal and 9 grams of protein. Change to EE once diet advanced  NUTRITION DIAGNOSIS: Inadequate oral intake related to acute severe abdominal pain as evidenced by an estimated intake of <75% of normal for past 2 weeks.   Goal: Pt to meet >/= 90% of their estimated nutrition needs   Monitor:  Oral intake, weight, labs, diet order  Reason for Assessment: MST  54 y.o. male  Admitting Dx: Diverticulitis of colon with perforation  ASSESSMENT: 54 y.o. male with PMHx hypertension+ Diverticulitis The patient is presenting with complaints of left lower quadrant abdominal pain ongoing for last 1 week. CT scan shows perforation w/ abscess. Current Bowel rest  Pt reports that he has an intermittent appetite for two days. Sometimes he would be able to eat fine and other times it would hurt to eat. He said he was eating cheeseburgers yesterday when he had his severe abdominal pain. Denies n/v/c/d.   He says he thinks he lost about 10 pounds these past two weeks. He says his  normal weight is ~235-240. No recent weight history. Unable to determine exact amount of weight lost.   Nutrition Focused Physical Exam: All WDL   Height: Ht Readings from Last 1 Encounters:  08/16/14  (1.854 m)    Weight: Wt Readings from Last 1 Encounters:  08/17/14 228 lb 6.3 oz (103.6 kg)    Ideal Body Weight: 184 lbs (83.6 kg)  % Ideal Body Weight: 124%  Wt Readings from Last 10 Encounters:  08/17/14 228 lb 6.3 oz (103.6 kg)  04/13/14 235 lb (106.595 kg)  Loss of 7  pounds in 4 months  Usual Body Weight: 235-240  % Usual Body Weight: 97%  BMI:  Body mass index is 30.14 kg/(m^2).  Estimated Nutritional Needs: Kcal: 1850-2050 (18-20 kcal/kg) Protein: 109-125 (1.3-1.5 g/ibw) Fluid: 1.9-2.1   Skin: WDL  Diet Order: Diet NPO time specified Except for: Sips with Meds, Ice Chips  EDUCATION NEEDS: -No education needs identified at this time   Intake/Output Summary (Last 24 hours) at 08/17/14 0852 Last data filed at 08/17/14 0848  Gross per 24 hour  Intake 1131.67 ml  Output    530 ml  Net 601.67 ml    Last BM: 4/29   Labs:   Recent Labs Lab 08/16/14 1926 08/17/14 0616  NA 139 139  K 4.6 4.3  CL 104 107  CO2 24 25  BUN 12 9  CREATININE 1.21 1.21  CALCIUM 9.1 8.2*  GLUCOSE 83 98    CBG (last 3)  No results for input(s): GLUCAP in the last 72 hours.  Scheduled Meds: . aspirin EC  81 mg Oral Daily  . ciprofloxacin  400 mg Intravenous Q12H  . enoxaparin (LOVENOX) injection  40 mg Subcutaneous Daily  . metronidazole  500 mg Intravenous Once  . metronidazole  500 mg Intravenous 3 times per day  . sodium chloride  3 mL Intravenous Q12H  . thiamine  100 mg Oral Daily  . vitamin B-12  1,000 mcg Oral Daily    Continuous Infusions: . sodium chloride 100 mL/hr at  08/17/14 [id number]    Past Medical History  Diagnosis Date  . Hypertension   . Diverticulitis     Past Surgical History  Procedure Laterality Date  . Hammer toe surgery      Christophe LouisNathan Franks RD, LDN Nutrition Pager: [phone] 08/17/2014 8:53 AM

## 2014-08-17 NOTE — H&P (Signed)
Chief Complaint: Chief Complaint  Patient presents with  . Abdominal Pain    Referring Physician(s): CCS  History of Present Illness: Jonathan Graves is a 54 y.o. male who has a history of diverticulitis and follows with GI Dr. Bosie ClosSchooler. He has been c/o lower abdominal pain x 2 weeks with subjective fevers and night sweats. A CT done on 08/16/14 revealed sigmoid diverticulitis and associated gas collection suggesting perforation, the patient was admitted and surgery has been consulted. IR received request for image guided percutaneous drain placement. The patient denies any current abdominal/pelvic pain while resting. He denies any chest pain, shortness of breath or palpitations. He denies any active signs of bleeding or excessive bruising. The patient denies any history of sleep apnea or chronic oxygen use. He has previously tolerated sedation without complications.    Past Medical History  Diagnosis Date  . Hypertension   . Diverticulitis     Past Surgical History  Procedure Laterality Date  . Hammer toe surgery      Allergies: Review of patient's allergies indicates no known allergies.  Medications: Prior to Admission medications   Medication Sig Start Date End Date Taking? Authorizing Provider  aspirin EC 81 MG tablet Take 81 mg by mouth daily.   Yes Historical Provider, MD  ciprofloxacin (CIPRO) 500 MG tablet Take 1 tablet (500 mg total) by mouth 2 (two) times daily. One po bid x 10 days 11/04/13  Yes April Palumbo, MD  FIBER PO Take 1 tablet by mouth daily.   Yes Historical Provider, MD  lisinopril-hydrochlorothiazide (PRINZIDE,ZESTORETIC) 20-12.5 MG per tablet Take 2 tablets by mouth daily. 07/29/14  Yes Historical Provider, MD  meloxicam (MOBIC) 15 MG tablet Take 15 mg by mouth 2 (two) times daily as needed for pain.  08/08/14  Yes Historical Provider, MD  metroNIDAZOLE (FLAGYL) 500 MG tablet Take 500 mg by mouth 2 (two) times daily. Started 08/14/14, for 10 days ending  08/23/14   Yes Historical Provider, MD  oxyCODONE-acetaminophen (PERCOCET) 5-325 MG per tablet Take 1 tablet by mouth every 4 (four) hours as needed for moderate pain. 04/13/14  Yes Dione Boozeavid Glick, MD  vitamin B-12 (CYANOCOBALAMIN) 1000 MCG tablet Take 1,000 mcg by mouth daily.   Yes Historical Provider, MD  metroNIDAZOLE (FLAGYL) 500 MG tablet Take 1 tablet (500 mg total) by mouth 3 (three) times daily. One po bid x 10 days 11/04/13   April Palumbo, MD  naproxen (NAPROSYN) 500 MG tablet Take 1 tablet (500 mg total) by mouth 2 (two) times daily. 04/13/14   Dione Boozeavid Glick, MD     Family History  Problem Relation Age of Onset  . Gastric cancer Mother   . Breast cancer Mother   . Hypertension Father     History   Social History  . Marital Status: Married    Spouse Name: N/A  . Number of Children: N/A  . Years of Education: N/A   Social History Main Topics  . Smoking status: Current Every Day Smoker -- 0.25 packs/day    Types: Cigarettes  . Smokeless tobacco: Not on file  . Alcohol Use: 16.2 oz/week    6 Cans of beer, 21 Shots of liquor per week  . Drug Use: No  . Sexual Activity: Not on file   Other Topics Concern  . None   Social History Narrative   Review of Systems: A 12 point ROS discussed and pertinent positives are indicated in the HPI above.  All other systems are negative.  Review of Systems  Vital Signs: BP 96/52 mmHg  Pulse 49  Temp(Src) 97.7 F (36.5 C) (Oral)  Resp 16  Ht  (1.854 m)  Wt 228 lb 6.3 oz (103.6 kg)  BMI 30.14 kg/m2  SpO2 97%  Physical Exam  Constitutional: He is oriented to person, place, and time. No distress.  HENT:  Head: Normocephalic and atraumatic.  Neck: No tracheal deviation present.  Cardiovascular: Normal rate and regular rhythm.  Exam reveals no gallop and no friction rub.   No murmur heard. Pulmonary/Chest: Effort normal and breath sounds normal. No respiratory distress. He has no wheezes. He has no rales.  Abdominal: Soft.  Bowel sounds are normal. There is tenderness.  Neurological: He is alert and oriented to person, place, and time.  Skin: Skin is warm and dry. He is not diaphoretic.    Mallampati Score:  MD Evaluation Airway: WNL Heart: WNL Abdomen: WNL Chest/ Lungs: WNL ASA  Classification: 2 Mallampati/Airway Score: Two  Imaging: Ct Abdomen Pelvis W Contrast  08/16/2014   CLINICAL DATA:  Left lower quadrant pain x1.5 weeks  EXAM: CT ABDOMEN AND PELVIS WITH CONTRAST  TECHNIQUE: Multidetector CT imaging of the abdomen and pelvis was performed using the standard protocol following bolus administration of intravenous contrast.  CONTRAST:  125 mL Isovue 300 IV  COMPARISON:  11/04/2013  FINDINGS: Lower chest:  Lung bases are clear.  Coronary atherosclerosis.  Hepatobiliary: 6 mm hypervascular lesion inferiorly in the posterior segment right hepatic lobe (series 3/ image 27), technically indeterminate but suggestive of a flash filling hemangioma.  Gallbladder is unremarkable. No intrahepatic or extrahepatic ductal dilatation.  Pancreas: Within normal limits.  Spleen: Within normal limits.  Adrenals/Urinary Tract: Adrenal glands are within normal limits.  Kidneys are within normal limits.  No hydronephrosis.  Bladder is within normal limits.  Stomach/Bowel: Stomach is within normal limits.  No evidence of bowel obstruction.  Normal appendix.  Sigmoid colonic wall thickening/ inflammatory changes (series 3/image 74), suggesting sigmoid diverticulitis.  Associated localized, irregular gas collection in the lower abdomen measuring 9.6 x 6.3 x 5.6 cm (series 3/ image 65), suggesting localized macroscopic perforation. Minimal layering fluid dependently within one portion of the collection (series 3/image 69). Overall collection reflects multiple smaller air-filled spaces and does not have a single well-defined rim.  No free air.  Vascular/Lymphatic: Atherosclerotic calcifications of the abdominal aorta and branch vessels.   Small upper abdominal/ retroperitoneal lymph nodes which do not meet pathologic CT size criteria, likely reactive.  Reproductive: Prostate is unremarkable.  Other: No abdominopelvic ascites.  Musculoskeletal: Degenerative changes of the visualized thoracolumbar spine.  IMPRESSION: Sigmoid colonic wall thickening/ inflammatory changes, suggesting sigmoid diverticulitis. Consider follow-up colonoscopy as clinically warranted.  Associated 9.6 x 6.3 x 5.6 cm irregular gas collection in the lower abdomen, as described above, suggesting localized macroscopic perforation. No free air.   Electronically Signed   By: Charline Bills M.D.   On: 08/16/2014 14:06    Labs:  CBC:  Recent Labs  11/04/13 0020 08/16/14 1926 08/17/14 0616  WBC 11.0* 11.4* 9.1  HGB 16.6 12.8* 11.8*  HCT 46.3 37.6* 34.9*  PLT 175 367 299    COAGS:  Recent Labs  08/17/14 0616  INR 1.18    BMP:  Recent Labs  11/04/13 0020 08/16/14 1926 08/17/14 0616  NA 141 139 139  K 3.5* 4.6 4.3  CL 100 104 107  CO2 GLUCOSE 183* 83 98  BUN CALCIUM  9.7 9.1 8.2*  CREATININE 1.20 1.21 1.21  GFRNONAA 67* 66* 66*  GFRAA 78* 77* 77*    LIVER FUNCTION TESTS:  Recent Labs  11/04/13 0020 08/16/14 1926 08/17/14 0616  BILITOT 0.7 0.5 0.4  AST ALT ALKPHOS 46 48 38*  PROT 7.9 7.4 6.0  ALBUMIN 4.2 3.5 2.8*   Assessment and Plan: Lower abdominal pain x 2 weeks CT 08/16/14 with sigmoid diverticulitis and associated gas collection suggesting perforation  Request for image guided percutaneous drain placement Patient received Lovenox today, will keep NPO after midnight, hold Lovenox and plan for drain tomorrow with moderate sedation Images and labs reviewed Risks and Benefits discussed with the patient including bleeding, infection, damage to adjacent structures, bowel perforation/fistula connection, and sepsis. All of the patient's questions were answered, patient is agreeable to  proceed. Consent signed and in chart.   Thank you for this interesting consult.  I greatly enjoyed meeting Ekansh Sherk and look forward to participating in their care.  SignedBerneta Levins 08/17/2014, 12:29 PM   I spent a total of 40 Minutes in face to face in clinical consultation, greater than 50% of which was counseling/coordinating care for diverticulitis gas collection.

## 2014-08-18 ENCOUNTER — Inpatient Hospital Stay (HOSPITAL_COMMUNITY): Payer: BLUE CROSS/BLUE SHIELD

## 2014-08-18 DIAGNOSIS — E440 Moderate protein-calorie malnutrition: Secondary | ICD-10-CM | POA: Insufficient documentation

## 2014-08-18 MED ORDER — MIDAZOLAM HCL 2 MG/2ML IJ SOLN
INTRAMUSCULAR | Status: AC
Start: 2014-08-18 — End: 2014-08-18
  Filled 2014-08-18: qty 2

## 2014-08-18 MED ORDER — SODIUM CHLORIDE 0.9 % IV SOLN
INTRAVENOUS | Status: AC | PRN
  Administered 2014-08-18: 10 mL/h via INTRAVENOUS

## 2014-08-18 MED ORDER — LIDOCAINE HCL 1 % IJ SOLN
INTRAMUSCULAR | Status: AC
  Filled 2014-08-18: qty 20

## 2014-08-18 MED ORDER — FENTANYL CITRATE (PF) 100 MCG/2ML IJ SOLN
INTRAMUSCULAR | Status: AC | PRN
  Administered 2014-08-18: 25 ug via INTRAVENOUS
  Administered 2014-08-18: 50 ug via INTRAVENOUS

## 2014-08-18 MED ORDER — MIDAZOLAM HCL 2 MG/2ML IJ SOLN
INTRAMUSCULAR | Status: AC | PRN
  Administered 2014-08-18: 0.5 mg via INTRAVENOUS
  Administered 2014-08-18: 1 mg via INTRAVENOUS

## 2014-08-18 MED ORDER — FENTANYL CITRATE (PF) 100 MCG/2ML IJ SOLN
INTRAMUSCULAR | Status: AC
  Filled 2014-08-18: qty 2

## 2014-08-18 NOTE — Care Management Note (Signed)
    Page 1 of 1   08/22/2014     9:02:01 AM CARE MANAGEMENT NOTE 08/22/2014  Patient:  Jonathan Graves,Jonathan Graves   Account Number:  [id number]  Date Initiated:  08/18/2014  Documentation initiated by:  Lawerance SabalSWIST,DEBBIE  Subjective/Objective Assessment:   diverticultis, bowel perf, from home with spouse self care     Action/Plan:   will follow for discharge needs   Anticipated DC Date:  08/21/2014   Anticipated DC Plan:  HOME/SELF CARE      DC Planning Services  CM consult      [facility]   Choice offered to / List presented to:  C-1 Patient        HH arranged  HH-1 RN      [facility]  [facility]   Status of service:  Completed, signed off Medicare Important Message given?   (If response is "NO", the following Medicare IM given date fields will be blank) Date Medicare IM given:   Medicare IM given by:   Date Additional Medicare IM given:   Additional Medicare IM given by:    Discharge Disposition:  HOME W HOME HEALTH SERVICES  Per UR Regulation:  Reviewed for med. necessity/level of care/duration of stay  If discussed at Long Length of Stay Meetings, dates discussed:    Comments:  [facility]. Lawerance Sabalebbie Swist RN BSN CM  08-18-14 will follow for discharge needs. Lawerance Sabalebbie Swist RN BSN CM

## 2014-08-18 NOTE — Progress Notes (Signed)
Notified Callahan, NP that pt requesting Claiborne Billingstylenol for mild pain. Claiborne Billingsallahan, NP gave verbal order for tylenol suppository due to patient being npo since midnight for procedure. Pt refused suppository and states we will just hold off for now, pain is okay right now. Will continue to monitor pt. Nelda MarseilleJenny Thacker, RN

## 2014-08-18 NOTE — Progress Notes (Signed)
Patient ID: Jonathan Graves, male   DOB: 09/30/1960, 54 y.o.   MRN: [id number] [facility] Progress [facility]  Jonathan Graves 54 y.o. 09/30/1960   Subjective: S/P percutaneous drain of intra-abdominal abscess this morning and small amount of bloody fluid noted in tubing and trace amount in bag. Feels ok. Wife in room.  Objective: Vital signs in last 24 hours: Filed Vitals:   08/18/14 1233  BP: 113/70  Pulse: 46  Temp: 98.4 F (36.9 C)  Resp: 18    Physical Exam: Gen: alert, no acute distress, well-nourished CV: RRR Chest: CTA B Abd: LLQ not palpated due to drain placement, nontender in other quadrants, soft, nondistended, +BS  Lab Results:  Recent Labs  08/16/14 1926 08/17/14 0616  NA 139 139  K 4.6 4.3  CL 104 107  CO2 24 25  GLUCOSE 83 98  BUN 12 9  CREATININE 1.21 1.21  CALCIUM 9.1 8.2*    Recent Labs  08/16/14 1926 08/17/14 0616  AST 23 20  ALT 27 20  ALKPHOS 48 38*  BILITOT 0.5 0.4  PROT 7.4 6.0  ALBUMIN 3.5 2.8*    Recent Labs  08/16/14 1926 08/17/14 0616  WBC 11.4* 9.1  NEUTROABS 6.2  --   HGB 12.8* 11.8*  HCT 37.6* 34.9*  MCV 97.7 97.8  PLT 367 299    Recent Labs  08/17/14 0616  LABPROT 15.2  INR 1.18      Assessment/Plan: Diverticulitis with large abscess - s/p perc drain today by IR. Continue IV Abx. Continue clear liquids today and consider advancing tomorrow if stable. Dr. Merrily Graves/Jonathan Graves to see tomorrow.   Jonathan Gaby C. 08/18/2014, 2:25 PM

## 2014-08-18 NOTE — Progress Notes (Signed)
ANTIBIOTIC CONSULT NOTE   Pharmacy Consult for Cipro Indication: intra-abdominal infection  No Known Allergies  Labs:  Recent Labs  08/16/14 1926 08/17/14 0616  WBC 11.4* 9.1  HGB 12.8* 11.8*  PLT 367 299  CREATININE 1.21 1.21   Estimated Creatinine Clearance: 88.3 mL/min (by C-G formula based on Cr of 1.21). No results for input(s): VANCOTROUGH, VANCOPEAK, VANCORANDOM, GENTTROUGH, GENTPEAK, GENTRANDOM, TOBRATROUGH, TOBRAPEAK, TOBRARND, AMIKACINPEAK, AMIKACINTROU, AMIKACIN in the last 72 hours.   Microbiology: No results found for this or any previous visit (from the past 720 hour(s)).  Assessment: 54yo male with history of HTN and diverticulitis presents with abdominal pain.  Continue on IV cipro and flagyl -- renal function is stable  Goal of Therapy:  Eradication of infection  Plan:  Continue Cipro 400 mg iv Q 12 hours Pharmacy will sign off and follow renal function at a distance  Thank you Okey RegalLisa Saralyn Willison, PharmD [phone] 08/18/2014,12:56 PM

## 2014-08-18 NOTE — Sedation Documentation (Signed)
Patient denies pain and is resting comfortably.  

## 2014-08-18 NOTE — Progress Notes (Signed)
Utilization Review completed. Ceasar Decandia RN BSN CM 

## 2014-08-18 NOTE — Progress Notes (Signed)
Patient ID: Jonathan Graves, male   DOB: July 01, 1960, 54 y.o.   MRN: [id number] [facility] Progress Note:   * No surgery found *  Subjective: Mental status is clear and just returned from IR drainage Objective: Vital signs in last 24 hours: Temp:  [98.3 F (36.8 C)-98.7 F (37.1 C)] 98.4 F (36.9 C) (05/01 1233) Pulse Rate:  [46-87] 46 (05/01 1233) Resp:  [9-18] 18 (05/01 1233) BP: (104-128)/(56-70) 113/70 mmHg (05/01 1233) SpO2:  [96 %-100 %] 97 % (05/01 1233) Weight:  [104 kg (229 lb 4.5 oz)] 104 kg (229 lb 4.5 oz) (05/01 0543)  Intake/Output from previous day: 04/30 0701 - 05/01 0700 In: 2095 [P.O.:540; I.V.:1155; IV Piggyback:400] Out: 950 [Urine:950] Intake/Output this shift: Total I/O In: 300 [P.O.:300] Out: -   Physical Exam: Work of breathing is normal.  Percutaneous drain in diverticular abscess  Lab Results:  Results for orders placed or performed during the hospital encounter of 08/16/14 (from the past 48 hour(s))  CBC with Differential     Status: Abnormal   Collection Time: 08/16/14  7:26 PM  Result Value Ref Range   WBC 11.4 (H) 4.0 - 10.5 K/uL   RBC 3.85 (L) 4.22 - 5.81 MIL/uL   Hemoglobin 12.8 (L) 13.0 - 17.0 g/dL   HCT 37.6 (L) 39.0 - 52.0 %   MCV 97.7 78.0 - 100.0 fL   MCH 33.2 26.0 - 34.0 pg   MCHC 34.0 30.0 - 36.0 g/dL   RDW 13.1 11.5 - 15.5 %   Platelets 367 150 - 400 K/uL   Neutrophils Relative % 55 43 - 77 %   Neutro Abs 6.2 1.7 - 7.7 K/uL   Lymphocytes Relative 35 12 - 46 %   Lymphs Abs 4.0 0.7 - 4.0 K/uL   Monocytes Relative 8 3 - 12 %   Monocytes Absolute 0.9 0.1 - 1.0 K/uL   Eosinophils Relative 2 0 - 5 %   Eosinophils Absolute 0.2 0.0 - 0.7 K/uL   Basophils Relative 0 0 - 1 %   Basophils Absolute 0.1 0.0 - 0.1 K/uL  Comprehensive metabolic panel     Status: Abnormal   Collection Time: 08/16/14  7:26 PM  Result Value Ref Range   Sodium 139 135 - 145 mmol/L   Potassium 4.6 3.5 - 5.1 mmol/L   Chloride 104 96 - 112 mmol/L   CO2 24 19 - 32 mmol/L   Glucose, Bld 83 70 - 99 mg/dL   BUN 12 6 - 23 mg/dL   Creatinine, Ser 1.21 0.50 - 1.35 mg/dL   Calcium 9.1 8.4 - 10.5 mg/dL   Total Protein 7.4 6.0 - 8.3 g/dL   Albumin 3.5 3.5 - 5.2 g/dL   AST 23 0 - 37 U/L   ALT 27 0 - 53 U/L   Alkaline Phosphatase 48 39 - 117 U/L   Total Bilirubin 0.5 0.3 - 1.2 mg/dL   GFR calc non Af Amer 66 (L) >90 mL/min   GFR calc Af Amer 77 (L) >90 mL/min    Comment: (NOTE) The eGFR has been calculated using the CKD EPI equation. This calculation has not been validated in all clinical situations. eGFR's persistently <90 mL/min signify possible Chronic Kidney Disease.    Anion gap 11 5 - 15  Lipase, blood     Status: None   Collection Time: 08/16/14  7:26 PM  Result Value Ref Range   Lipase 33 11 - 59 U/L  Comprehensive metabolic panel  Status: Abnormal   Collection Time: 08/17/14  6:16 AM  Result Value Ref Range   Sodium 139 135 - 145 mmol/L   Potassium 4.3 3.5 - 5.1 mmol/L   Chloride 107 96 - 112 mmol/L   CO2 25 19 - 32 mmol/L   Glucose, Bld 98 70 - 99 mg/dL   BUN 9 6 - 23 mg/dL   Creatinine, Ser 1.21 0.50 - 1.35 mg/dL   Calcium 8.2 (L) 8.4 - 10.5 mg/dL   Total Protein 6.0 6.0 - 8.3 g/dL   Albumin 2.8 (L) 3.5 - 5.2 g/dL   AST 20 0 - 37 U/L   ALT 20 0 - 53 U/L   Alkaline Phosphatase 38 (L) 39 - 117 U/L   Total Bilirubin 0.4 0.3 - 1.2 mg/dL   GFR calc non Af Amer 66 (L) >90 mL/min   GFR calc Af Amer 77 (L) >90 mL/min    Comment: (NOTE) The eGFR has been calculated using the CKD EPI equation. This calculation has not been validated in all clinical situations. eGFR's persistently <90 mL/min signify possible Chronic Kidney Disease.    Anion gap 7 5 - 15  CBC     Status: Abnormal   Collection Time: 08/17/14  6:16 AM  Result Value Ref Range   WBC 9.1 4.0 - 10.5 K/uL   RBC 3.57 (L) 4.22 - 5.81 MIL/uL   Hemoglobin 11.8 (L) 13.0 - 17.0 g/dL   HCT 34.9 (L) 39.0 - 52.0 %   MCV 97.8 78.0 - 100.0 fL   MCH 33.1 26.0 -  34.0 pg   MCHC 33.8 30.0 - 36.0 g/dL   RDW 13.1 11.5 - 15.5 %   Platelets 299 150 - 400 K/uL  Protime-INR     Status: None   Collection Time: 08/17/14  6:16 AM  Result Value Ref Range   Prothrombin Time 15.2 11.6 - 15.2 seconds   INR 1.18 0.00 - 1.49    Radiology/Results: Ct Abdomen Pelvis W Contrast  08/16/2014   CLINICAL DATA:  Left lower quadrant pain x1.5 weeks  EXAM: CT ABDOMEN AND PELVIS WITH CONTRAST  TECHNIQUE: Multidetector CT imaging of the abdomen and pelvis was performed using the standard protocol following bolus administration of intravenous contrast.  CONTRAST:  125 mL Isovue 300 IV  COMPARISON:  11/04/2013  FINDINGS: Lower chest:  Lung bases are clear.  Coronary atherosclerosis.  Hepatobiliary: 6 mm hypervascular lesion inferiorly in the posterior segment right hepatic lobe (series 3/ image 27), technically indeterminate but suggestive of a flash filling hemangioma.  Gallbladder is unremarkable. No intrahepatic or extrahepatic ductal dilatation.  Pancreas: Within normal limits.  Spleen: Within normal limits.  Adrenals/Urinary Tract: Adrenal glands are within normal limits.  Kidneys are within normal limits.  No hydronephrosis.  Bladder is within normal limits.  Stomach/Bowel: Stomach is within normal limits.  No evidence of bowel obstruction.  Normal appendix.  Sigmoid colonic wall thickening/ inflammatory changes (series 3/image 74), suggesting sigmoid diverticulitis.  Associated localized, irregular gas collection in the lower abdomen measuring 9.6 x 6.3 x 5.6 cm (series 3/ image 65), suggesting localized macroscopic perforation. Minimal layering fluid dependently within one portion of the collection (series 3/image 69). Overall collection reflects multiple smaller air-filled spaces and does not have a single well-defined rim.  No free air.  Vascular/Lymphatic: Atherosclerotic calcifications of the abdominal aorta and branch vessels.  Small upper abdominal/ retroperitoneal lymph nodes  which do not meet pathologic CT size criteria, likely reactive.  Reproductive: Prostate is unremarkable.  Other: No abdominopelvic ascites.  Musculoskeletal: Degenerative changes of the visualized thoracolumbar spine.  IMPRESSION: Sigmoid colonic wall thickening/ inflammatory changes, suggesting sigmoid diverticulitis. Consider follow-up colonoscopy as clinically warranted.  Associated 9.6 x 6.3 x 5.6 cm irregular gas collection in the lower abdomen, as described above, suggesting localized macroscopic perforation. No free air.   Electronically Signed   By: Julian Hy M.D.   On: 08/16/2014 14:06    Anti-infectives: Anti-infectives    Start     Dose/Rate Route Frequency Ordered Stop   08/17/14 2300  metroNIDAZOLE (FLAGYL) IVPB 500 mg  Status:  Discontinued     500 mg 100 mL/hr over 60 Minutes Intravenous 3 times per day 08/16/14 2236 08/16/14 2236   08/17/14 0930  ciprofloxacin (CIPRO) IVPB 400 mg     400 mg 200 mL/hr over 60 Minutes Intravenous Every 12 hours 08/16/14 2234     08/16/14 2300  metroNIDAZOLE (FLAGYL) IVPB 500 mg     500 mg 100 mL/hr over 60 Minutes Intravenous 3 times per day 08/16/14 2236     08/16/14 2230  metroNIDAZOLE (FLAGYL) IVPB 500 mg  Status:  Discontinued     500 mg 100 mL/hr over 60 Minutes Intravenous Every 8 hours 08/16/14 2227 08/16/14 2236   08/16/14 2015  ciprofloxacin (CIPRO) IVPB 400 mg     400 mg 200 mL/hr over 60 Minutes Intravenous  Once 08/16/14 2012 08/16/14 2229   08/16/14 2015  metroNIDAZOLE (FLAGYL) IVPB 500 mg     500 mg 100 mL/hr over 60 Minutes Intravenous  Once 08/16/14 2012        Assessment/Plan: Problem List: Patient Active Problem List   Diagnosis Date Noted  . Malnutrition of moderate degree 08/18/2014  . Alcohol use 08/17/2014  . Hypertension   . Diverticulitis 08/16/2014  . Diverticulitis of colon with perforation 08/16/2014    Post percutaneous drain of diverticular abscess.  Improved  * No surgery found *    LOS: 2  days   Matt B. Hassell Done, MD, [facility] [phone] beeper [phone]  08/18/2014 12:54 PM

## 2014-08-18 NOTE — Procedures (Signed)
Interventional Radiology Procedure Note  Procedure: CT guided drain placement of diverticular abscess.  Most dependent portion is inaccessible.  32F pigtail drain placed.  Sample sent to the lab.  Complications: None Recommendations:   - Do not submerge  - Routine care with BID sterile flush.   - Follow up drain clinic after Abx completed.    Signed,  Yvone NeuJaime S. Loreta AveWagner, DO

## 2014-08-18 NOTE — Progress Notes (Signed)
[facility] PROGRESS NOTE  Jonathan Graves DOB: [birth year] DOA: 08/16/2014 PCP: Lenora BoysFRIED, ROBERT L, MD  Assessment/Plan: 1. Diverticulitis with perforation 1. GI and General Surgery following 2. Pt is continued on cipro and flagyl 3. Pt is now s/p IR placement of perc tube with min amounts of drainage noted 4. Overall, pt reports improved abd pain 2. HTN 1. BP remains stable and controlled 2. Cont to monitor 3. Hx ETOH abuse 1. Stable thus far 2. No evidence of withdrawals 4. DVT prophylaxis 1. Lovenox  Code Status: Full Family Communication: Pt in room Disposition Plan: Pending   Consultants:  GI  General Surgery  IR  Procedures:  Perc tube drain placement 5/1  Antibiotics:  Ciprofloxacin 4/30>>>  Flagyl 4/30>>>  HPI/Subjective: States abd pain is much improved  Objective: Filed Vitals:   08/18/14 1102 08/18/14 1137 08/18/14 1202 08/18/14 1233  BP: 107/67 104/67 120/64 113/70  Pulse: 50 50 46 46  Temp: 98.4 F (36.9 C) 98.3 F (36.8 C) 98.3 F (36.8 C) 98.4 F (36.9 C)  TempSrc: Oral Oral Oral Oral  Resp: 18 18 18 18   Height:      Weight:      SpO2: 96% 97% 96% 97%    Intake/Output Summary (Last 24 hours) at 08/18/14 1509 Last data filed at 08/18/14 1437  Gross per 24 hour  Intake   2795 ml  Output    950 ml  Net   1845 ml   Filed Weights   08/16/14 2244 08/17/14 0505 08/18/14 0543  Weight: 103.6 kg (228 lb 6.3 oz) 103.6 kg (228 lb 6.3 oz) 104 kg (229 lb 4.5 oz)    Exam:   General:  Awake, laying in bed, in nad  Cardiovascular: regular, s1, s2  Respiratory: normal resp effort, no wheezing  Abdomen: soft, pos BS, abd pain improved  Musculoskeletal: perfused, no clubbing   Data Reviewed: Basic Metabolic Panel:  Recent Labs Lab 08/16/14 1926 08/17/14 0616  NA 139 139  K 4.6 4.3  CL 104 107  CO2 24 25  GLUCOSE 83 98  BUN 12 9  CREATININE 1.21 1.21  CALCIUM 9.1 8.2*   Liver Function Tests:  Recent  Labs Lab 08/16/14 1926 08/17/14 0616  AST 23 20  ALT 27 20  ALKPHOS 48 38*  BILITOT 0.5 0.4  PROT 7.4 6.0  ALBUMIN 3.5 2.8*    Recent Labs Lab 08/16/14 1926  LIPASE 33   No results for input(s): AMMONIA in the last 168 hours. CBC:  Recent Labs Lab 08/16/14 1926 08/17/14 0616  WBC 11.4* 9.1  NEUTROABS 6.2  --   HGB 12.8* 11.8*  HCT 37.6* 34.9*  MCV 97.7 97.8  PLT 367 299   Cardiac Enzymes: No results for input(s): CKTOTAL, CKMB, CKMBINDEX, TROPONINI in the last 168 hours. BNP (last 3 results) No results for input(s): BNP in the last 8760 hours.  ProBNP (last 3 results) No results for input(s): PROBNP in the last 8760 hours.  CBG: No results for input(s): GLUCAP in the last 168 hours.  No results found for this or any previous visit (from the past 240 hour(s)).   Studies: Ct Image Guided Drainage By Percutaneous Catheter  08/18/2014   CLINICAL DATA:  54 year old male with a history of diverticulitis and abscess.  EXAM: CT GUIDED DRAINAGE OF PELVIC ABSCESS  ANESTHESIA/SEDATION: 1.5 Mg IV Versed 75 mcg IV Fentanyl  Total Moderate Sedation Time:  17 minutes  PROCEDURE: The procedure, risks, benefits, and alternatives were explained  to the patient. Questions regarding the procedure were encouraged and answered. The patient understands and consents to the procedure.  The patient is plate in the supine position on CT gantry table. Scout CT the abdomen/pelvis was performed for planning purposes.  Once a site of approach was determined, the patient was prepped and draped in the usual sterile fashion. The skin and subcutaneous tissues of the approach area were generously infiltrated 1% lidocaine without epinephrine. A small stab inches in was made with 11 blade scalpel. Using CT guidance, a 15 cm trocar needle was advanced with the tip into the gas collection of the pelvis. The stylet was removed, and an 035 wire was placed into the cavity. The needle was removed over the wire.  Dilation of the soft tissue tracks was performed with a [place] dilator.  We then placed a 10 French pigtail drain into the collection, with removal of the wire and the stiffener/cannula. The pigtail catheter was formed and a small sample was aspirated.  A final CT image was stored.  The patient tolerated the procedure well and remained hemodynamically stable throughout.  No complications were encountered and no significant blood loss was encountered.  COMPLICATIONS: None  FINDINGS: Scout CT of the abdomen demonstrates gas collection superior and anterior to the sigmoid colon. There is a gas and a small amount of fluid more dependently, inferior to the colon. The deeper collection appears to have no safe window, with overlying small bowel and sigmoid colon. This is also at the root of the small bowel mesenteric with adjacent neurovascular bundles.  Images during the case demonstrate safe placement of needle tip into the collection from a left anterior abdominal wall approach.  Final images demonstrate the pigtail catheter formed within the collection with decompression of the gas collection.  Small sample was sent to the lab for analysis.  IMPRESSION: Status post CT-guided drain of pelvic abscess. A sample was sent to the lab for analysis.  Signed,  Yvone Neu. Loreta Ave, DO  [facility]  [facility]   Electronically Signed   By: Gilmer Mor D.O.   On: 08/18/2014 14:01    Scheduled Meds: . aspirin EC  81 mg Oral Daily  . ciprofloxacin  400 mg Intravenous Q12H  . enoxaparin (LOVENOX) injection  40 mg Subcutaneous Daily  . feeding supplement (RESOURCE BREEZE)  1 Container Oral BID BM  . fentaNYL      . lidocaine      . metronidazole  500 mg Intravenous Once  . metronidazole  500 mg Intravenous 3 times per day  . midazolam      . sodium chloride  3 mL Intravenous Q12H  . thiamine  100 mg Oral Daily  . vitamin B-12  1,000 mcg Oral Daily   Continuous  Infusions: . sodium chloride 100 mL/hr at 08/18/14 0404    Principal Problem:   Diverticulitis of colon with perforation Active Problems:   Hypertension   Alcohol use   Malnutrition of moderate degree    CHIU, STEPHEN K  [facility] Pager [phone]. If 7PM-7AM, please contact night-coverage at [email or web address], password [facility] 08/18/2014, 3:09 PM  LOS: 2 days

## 2014-08-19 DIAGNOSIS — K5780 Diverticulitis of intestine, part unspecified, with perforation and abscess without bleeding: Secondary | ICD-10-CM

## 2014-08-19 LAB — CBC
HCT: 35.5 % — ABNORMAL LOW (ref 39.0–52.0)
Hemoglobin: 11.9 g/dL — ABNORMAL LOW (ref 13.0–17.0)
MCH: 32.8 pg (ref 26.0–34.0)
MCHC: 33.5 g/dL (ref 30.0–36.0)
MCV: 97.8 fL (ref 78.0–100.0)
Platelets: 310 10*3/uL (ref 150–400)
RBC: 3.63 MIL/uL — ABNORMAL LOW (ref 4.22–5.81)
RDW: 13.1 % (ref 11.5–15.5)
WBC: 7.6 10*3/uL (ref 4.0–10.5)

## 2014-08-19 MED ORDER — POLYETHYLENE GLYCOL 3350 17 G PO PACK
17.0000 g | PACK | Freq: Two times a day (BID) | ORAL | Status: DC
  Administered 2014-08-19 – 2014-08-20 (×3): 17 g via ORAL
  Filled 2014-08-19 (×6): qty 1

## 2014-08-19 NOTE — Progress Notes (Signed)
Subjective: Feels better today Passing flatus Pain less  Objective: Vital signs in last 24 hours: Temp:  [98.3 F (36.8 C)-98.5 F (36.9 C)] 98.5 F (36.9 C) (05/02 0534) Pulse Rate:  [46-58] 58 (05/02 0534) Resp:  [10-18] 18 (05/02 0534) BP: (104-138)/(64-89) 138/89 mmHg (05/02 0534) SpO2:  [96 %-99 %] 99 % (05/02 0534) Weight:  [104 kg (229 lb 4.5 oz)] 104 kg (229 lb 4.5 oz) (05/02 0656) Last BM Date: 08/16/14  Intake/Output from previous day: 05/01 0701 - 05/02 0700 In: 1710 [P.O.:1000; I.V.:400; IV Piggyback:300] Out: 1325 [Urine:1300; Drains:25] Intake/Output this shift: Total I/O In: 1300 [P.O.:200; I.V.:1000; IV Piggyback:100] Out: 0   Abdomen soft with mild LLQ tenderness  Lab Results:   Recent Labs  08/17/14 0616 08/19/14 0540  WBC 9.1 7.6  HGB 11.8* 11.9*  HCT 34.9* 35.5*  PLT 299 310   BMET  Recent Labs  08/16/14 1926 08/17/14 0616  NA 139 139  K 4.6 4.3  CL 104 107  CO2 24 25  GLUCOSE 83 98  BUN 12 9  CREATININE 1.21 1.21  CALCIUM 9.1 8.2*   PT/INR  Recent Labs  08/17/14 0616  LABPROT 15.2  INR 1.18   ABG No results for input(s): PHART, HCO3 in the last 72 hours.  Invalid input(s): PCO2, PO2  Studies/Results: Ct Image Guided Drainage By Percutaneous Catheter  08/18/2014   CLINICAL DATA:  [age] year old male with a history of diverticulitis and abscess.  EXAM: CT GUIDED DRAINAGE OF PELVIC ABSCESS  ANESTHESIA/SEDATION: 1.5 Mg IV Versed 75 mcg IV Fentanyl  Total Moderate Sedation Time:  17 minutes  PROCEDURE: The procedure, risks, benefits, and alternatives were explained to the patient. Questions regarding the procedure were encouraged and answered. The patient understands and consents to the procedure.  The patient is plate in the supine position on CT gantry table. Scout CT the abdomen/pelvis was performed for planning purposes.  Once a site of approach was determined, the patient was prepped and draped in the usual sterile fashion.  The skin and subcutaneous tissues of the approach area were generously infiltrated 1% lidocaine without epinephrine. A small stab inches in was made with 11 blade scalpel. Using CT guidance, a 15 cm trocar needle was advanced with the tip into the gas collection of the pelvis. The stylet was removed, and an 035 wire was placed into the cavity. The needle was removed over the wire. Dilation of the soft tissue tracks was performed with a [place] dilator.  We then placed a 10 French pigtail drain into the collection, with removal of the wire and the stiffener/cannula. The pigtail catheter was formed and a small sample was aspirated.  A final CT image was stored.  The patient tolerated the procedure well and remained hemodynamically stable throughout.  No complications were encountered and no significant blood loss was encountered.  COMPLICATIONS: None  FINDINGS: Scout CT of the abdomen demonstrates gas collection superior and anterior to the sigmoid colon. There is a gas and a small amount of fluid more dependently, inferior to the colon. The deeper collection appears to have no safe window, with overlying small bowel and sigmoid colon. This is also at the root of the small bowel mesenteric with adjacent neurovascular bundles.  Images during the case demonstrate safe placement of needle tip into the collection from a left anterior abdominal wall approach.  Final images demonstrate the pigtail catheter formed within the collection with decompression of the gas collection.  Small sample was sent to the  lab for analysis.  IMPRESSION: Status post CT-guided drain of pelvic abscess. A sample was sent to the lab for analysis.  Signed,  Yvone Neu. Loreta Ave, DO  [facility]  [facility]   Electronically Signed   By: Gilmer Mor D.O.   On: 08/18/2014 14:01    Anti-infectives: Anti-infectives    Start     Dose/Rate Route Frequency Ordered Stop   08/17/14 2300  metroNIDAZOLE  (FLAGYL) IVPB 500 mg  Status:  Discontinued     500 mg 100 mL/hr over 60 Minutes Intravenous 3 times per day 08/16/14 2236 08/16/14 2236   08/17/14 0930  ciprofloxacin (CIPRO) IVPB 400 mg     400 mg 200 mL/hr over 60 Minutes Intravenous Every 12 hours 08/16/14 2234     08/16/14 2300  metroNIDAZOLE (FLAGYL) IVPB 500 mg     500 mg 100 mL/hr over 60 Minutes Intravenous 3 times per day 08/16/14 2236     08/16/14 2230  metroNIDAZOLE (FLAGYL) IVPB 500 mg  Status:  Discontinued     500 mg 100 mL/hr over 60 Minutes Intravenous Every 8 hours 08/16/14 2227 08/16/14 2236   08/16/14 2015  ciprofloxacin (CIPRO) IVPB 400 mg     400 mg 200 mL/hr over 60 Minutes Intravenous  Once 08/16/14 2012 08/16/14 2229   08/16/14 2015  metroNIDAZOLE (FLAGYL) IVPB 500 mg  Status:  Discontinued     500 mg 100 mL/hr over 60 Minutes Intravenous  Once 08/16/14 2012 08/19/14 1022      Assessment/Plan: s/p * No surgery found *  Diverticulitis with abscess s/p perc drain, improving  Continue drain and IV antibiotics  LOS: 3 days    Aarion Kittrell A 08/19/2014

## 2014-08-19 NOTE — Progress Notes (Signed)
Referring Physician(s): CCS  Subjective: Pelvic Abscess drain placed 5/1.  Pt doing well. No complaints. Ambulating well.   Allergies: Review of patient's allergies indicates no known allergies.  Medications: Prior to Admission medications   Medication Sig Start Date End Date Taking? Authorizing Provider  aspirin EC 81 MG tablet Take 81 mg by mouth daily.   Yes Historical Provider, MD  ciprofloxacin (CIPRO) 500 MG tablet Take 1 tablet (500 mg total) by mouth 2 (two) times daily. One po bid x 10 days 11/04/13  Yes April Palumbo, MD  FIBER PO Take 1 tablet by mouth daily.   Yes Historical Provider, MD  lisinopril-hydrochlorothiazide (PRINZIDE,ZESTORETIC) 20-12.5 MG per tablet Take 2 tablets by mouth daily. 07/29/14  Yes Historical Provider, MD  meloxicam (MOBIC) 15 MG tablet Take 15 mg by mouth 2 (two) times daily as needed for pain.  08/08/14  Yes Historical Provider, MD  metroNIDAZOLE (FLAGYL) 500 MG tablet Take 500 mg by mouth 2 (two) times daily. Started 08/14/14, for 10 days ending 08/23/14   Yes Historical Provider, MD  oxyCODONE-acetaminophen (PERCOCET) 5-325 MG per tablet Take 1 tablet by mouth every 4 (four) hours as needed for moderate pain. 04/13/14  Yes Dione Booze, MD  vitamin B-12 (CYANOCOBALAMIN) 1000 MCG tablet Take 1,000 mcg by mouth daily.   Yes Historical Provider, MD  metroNIDAZOLE (FLAGYL) 500 MG tablet Take 1 tablet (500 mg total) by mouth 3 (three) times daily. One po bid x 10 days 11/04/13   April Palumbo, MD  naproxen (NAPROSYN) 500 MG tablet Take 1 tablet (500 mg total) by mouth 2 (two) times daily. 04/13/14   Dione Booze, MD     Vital Signs: BP 112/64 mmHg  Pulse 55  Temp(Src) 98.7 F (37.1 C) (Oral)  Resp 20  Ht  (1.854 m)  Wt 104 kg (229 lb 4.5 oz)  BMI 30.26 kg/m2  SpO2 97%  Physical Exam  Abdominal: Soft.  Drain in place. No erythema. Drainage in tube is slightly blood tinged.  No obvious purulence. Mildly tender to palpation at drain  site.  Clean , Dry, no bleeding.   Culture NGTD  Gravity Bag OP 25 mLs yesterday, 10 mLs today    Imaging: Ct Abdomen Pelvis W Contrast  08/16/2014   CLINICAL DATA:  Left lower quadrant pain x1.5 weeks  EXAM: CT ABDOMEN AND PELVIS WITH CONTRAST  TECHNIQUE: Multidetector CT imaging of the abdomen and pelvis was performed using the standard protocol following bolus administration of intravenous contrast.  CONTRAST:  125 mL Isovue 300 IV  COMPARISON:  11/04/2013  FINDINGS: Lower chest:  Lung bases are clear.  Coronary atherosclerosis.  Hepatobiliary: 6 mm hypervascular lesion inferiorly in the posterior segment right hepatic lobe (series 3/ image 27), technically indeterminate but suggestive of a flash filling hemangioma.  Gallbladder is unremarkable. No intrahepatic or extrahepatic ductal dilatation.  Pancreas: Within normal limits.  Spleen: Within normal limits.  Adrenals/Urinary Tract: Adrenal glands are within normal limits.  Kidneys are within normal limits.  No hydronephrosis.  Bladder is within normal limits.  Stomach/Bowel: Stomach is within normal limits.  No evidence of bowel obstruction.  Normal appendix.  Sigmoid colonic wall thickening/ inflammatory changes (series 3/image 74), suggesting sigmoid diverticulitis.  Associated localized, irregular gas collection in the lower abdomen measuring 9.6 x 6.3 x 5.6 cm (series 3/ image 65), suggesting localized macroscopic perforation. Minimal layering fluid dependently within one portion of the collection (series 3/image 69). Overall collection reflects multiple smaller air-filled spaces and  does not have a single well-defined rim.  No free air.  Vascular/Lymphatic: Atherosclerotic calcifications of the abdominal aorta and branch vessels.  Small upper abdominal/ retroperitoneal lymph nodes which do not meet pathologic CT size criteria, likely reactive.  Reproductive: Prostate is unremarkable.  Other: No abdominopelvic ascites.  Musculoskeletal:  Degenerative changes of the visualized thoracolumbar spine.  IMPRESSION: Sigmoid colonic wall thickening/ inflammatory changes, suggesting sigmoid diverticulitis. Consider follow-up colonoscopy as clinically warranted.  Associated 9.6 x 6.3 x 5.6 cm irregular gas collection in the lower abdomen, as described above, suggesting localized macroscopic perforation. No free air.   Electronically Signed   By: Charline Bills M.D.   On: 08/16/2014 14:06   Ct Image Guided Drainage By Percutaneous Catheter  08/18/2014   CLINICAL DATA:  54 year old male with a history of diverticulitis and abscess.  EXAM: CT GUIDED DRAINAGE OF PELVIC ABSCESS  ANESTHESIA/SEDATION: 1.5 Mg IV Versed 75 mcg IV Fentanyl  Total Moderate Sedation Time:  17 minutes  PROCEDURE: The procedure, risks, benefits, and alternatives were explained to the patient. Questions regarding the procedure were encouraged and answered. The patient understands and consents to the procedure.  The patient is plate in the supine position on CT gantry table. Scout CT the abdomen/pelvis was performed for planning purposes.  Once a site of approach was determined, the patient was prepped and draped in the usual sterile fashion. The skin and subcutaneous tissues of the approach area were generously infiltrated 1% lidocaine without epinephrine. A small stab inches in was made with 11 blade scalpel. Using CT guidance, a 15 cm trocar needle was advanced with the tip into the gas collection of the pelvis. The stylet was removed, and an 035 wire was placed into the cavity. The needle was removed over the wire. Dilation of the soft tissue tracks was performed with a [place] dilator.  We then placed a 10 French pigtail drain into the collection, with removal of the wire and the stiffener/cannula. The pigtail catheter was formed and a small sample was aspirated.  A final CT image was stored.  The patient tolerated the procedure well and remained hemodynamically stable throughout.   No complications were encountered and no significant blood loss was encountered.  COMPLICATIONS: None  FINDINGS: Scout CT of the abdomen demonstrates gas collection superior and anterior to the sigmoid colon. There is a gas and a small amount of fluid more dependently, inferior to the colon. The deeper collection appears to have no safe window, with overlying small bowel and sigmoid colon. This is also at the root of the small bowel mesenteric with adjacent neurovascular bundles.  Images during the case demonstrate safe placement of needle tip into the collection from a left anterior abdominal wall approach.  Final images demonstrate the pigtail catheter formed within the collection with decompression of the gas collection.  Small sample was sent to the lab for analysis.  IMPRESSION: Status post CT-guided drain of pelvic abscess. A sample was sent to the lab for analysis.  Signed,  Yvone Neu. Loreta Ave, DO  [facility]  [facility]   Electronically Signed   By: Gilmer Mor D.O.   On: 08/18/2014 14:01    Labs:  CBC:  Recent Labs  11/04/13 0020 08/16/14 1926 08/17/14 0616 08/19/14 0540  WBC 11.0* 11.4* 9.1 7.6  HGB 16.6 12.8* 11.8* 11.9*  HCT 46.3 37.6* 34.9* 35.5*  PLT 175 367 299 310    COAGS:  Recent Labs  08/17/14 0616  INR 1.18  BMP:  Recent Labs  11/04/13 0020 08/16/14 1926 08/17/14 0616  NA 141 139 139  K 3.5* 4.6 4.3  CL 100 104 107  CO2 24 24 25   GLUCOSE 183* 83 98  BUN 15 12 9   CALCIUM 9.7 9.1 8.2*  CREATININE 1.20 1.21 1.21  GFRNONAA 67* 66* 66*  GFRAA 78* 77* 77*    LIVER FUNCTION TESTS:  Recent Labs  11/04/13 0020 08/16/14 1926 08/17/14 0616  BILITOT 0.7 0.5 0.4  AST 17 23 20   ALT 24 27 20   ALKPHOS 46 48 38*  PROT 7.9 7.4 6.0  ALBUMIN 4.2 3.5 2.8*    Assessment and Plan: S/P Placement of Pelvic Abscess Drain 5/1 Doing well Will continue to follow Continue bag to gravity Plan per  CCS  Signed: Channah Godeaux A 08/19/2014, 3:59 PM   I spent a total of 15 Minutes in face to face in clinical consultation/evaluation, greater than 50% of which was counseling/coordinating care for Pelvic Abscess Drain

## 2014-08-19 NOTE — Plan of Care (Signed)
Problem: Phase II Progression Outcomes Goal: Progress activity as tolerated unless otherwise ordered Outcome: Completed/Met Date Met:  08/19/14 Patient up out of bed ambulating in room and in halls

## 2014-08-19 NOTE — Progress Notes (Signed)
[facility] PROGRESS NOTE  Jonathan Graves NWG:[id number] DOB: Nov 28, 1960 DOA: 08/16/2014 PCP: Lenora Boys, MD  Assessment/Plan: 1. Diverticulitis with perforation 1. GI and General Surgery following 2. Pt is continued on cipro and flagyl 3. Pt is now s/p IR placement of perc tube 4. Min amounts of drainage noted this AM 5. Pt reporting continued symptomatic improvement 2. HTN 1. BP remains stable and controlled 2. Cont to monitor 3. Hx ETOH abuse 1. Stable thus far 2. No evidence of withdrawals 4. DVT prophylaxis 1. Lovenox subQ while inpatient  Code Status: Full Family Communication: Pt in room Disposition Plan: Pending   Consultants:  GI  General Surgery  IR  Procedures:  Perc tube drain placement 5/1  Antibiotics:  Ciprofloxacin 4/30>>>  Flagyl 4/30>>>  HPI/Subjective: Reports pain is improved from yesterday  Objective: Filed Vitals:   08/18/14 2159 08/19/14 0534 08/19/14 0656 08/19/14 1348  BP: 123/72 138/89  112/64  Pulse: 53 58  55  Temp: 98.5 F (36.9 C) 98.5 F (36.9 C)  98.7 F (37.1 C)  TempSrc: Oral Oral  Oral  Resp: Height:      Weight:   104 kg (229 lb 4.5 oz)   SpO2: 97% 99%  97%    Intake/Output Summary (Last 24 hours) at 08/19/14 1605 Last data filed at 08/19/14 1412  Gross per 24 hour  Intake   3275 ml  Output   1335 ml  Net   1940 ml   Filed Weights   08/17/14 0505 08/18/14 0543 08/19/14 0656  Weight: 103.6 kg (228 lb 6.3 oz) 104 kg (229 lb 4.5 oz) 104 kg (229 lb 4.5 oz)    Exam:   General:  Ambulating in hallway, in nad  Cardiovascular: regular, s1, s2  Respiratory: normal resp effort, no wheezing  Abdomen: soft, pos BS, abd pain improved, perc tube drain in place  Musculoskeletal: perfused, no clubbing   Data Reviewed: Basic Metabolic Panel:  Recent Labs Lab 08/16/14 1926 08/17/14 0616  NA 139 139  K 4.6 4.3  CL 104 107  CO2 24 25  GLUCOSE 83 98  BUN 12 9  CREATININE 1.21 1.21   CALCIUM 9.1 8.2*   Liver Function Tests:  Recent Labs Lab 08/16/14 1926 08/17/14 0616  AST 23 20  ALT 27 20  ALKPHOS 48 38*  BILITOT 0.5 0.4  PROT 7.4 6.0  ALBUMIN 3.5 2.8*    Recent Labs Lab 08/16/14 1926  LIPASE 33   No results for input(s): AMMONIA in the last 168 hours. CBC:  Recent Labs Lab 08/16/14 1926 08/17/14 0616 08/19/14 0540  WBC 11.4* 9.1 7.6  NEUTROABS 6.2  --   --   HGB 12.8* 11.8* 11.9*  HCT 37.6* 34.9* 35.5*  MCV 97.7 97.8 97.8  PLT 367 299 310   Cardiac Enzymes: No results for input(s): CKTOTAL, CKMB, CKMBINDEX, TROPONINI in the last 168 hours. BNP (last 3 results) No results for input(s): BNP in the last 8760 hours.  ProBNP (last 3 results) No results for input(s): PROBNP in the last 8760 hours.  CBG: No results for input(s): GLUCAP in the last 168 hours.  Recent Results (from the past 240 hour(s))  Culture, routine-abscess     Status: None (Preliminary result)   Collection Time: 08/18/14 10:46 AM  Result Value Ref Range Status   Specimen Description ABSCESS DIVERTICULAR  Final   Special Requests NONE  Final   Gram Stain   Final    FEW  WBC PRESENT, PREDOMINANTLY PMN NO SQUAMOUS EPITHELIAL CELLS SEEN NO ORGANISMS SEEN [facility]formed [facility]    Culture   Final    NO GROWTH 1 DAY Performed at [facility]    Report Status PENDING  Incomplete     Studies: Ct Image Guided Drainage By Percutaneous Catheter  08/18/2014   CLINICAL DATA:  54 year old male with a history of diverticulitis and abscess.  EXAM: CT GUIDED DRAINAGE OF PELVIC ABSCESS  ANESTHESIA/SEDATION: 1.5 Mg IV Versed 75 mcg IV Fentanyl  Total Moderate Sedation Time:  17 minutes  PROCEDURE: The procedure, risks, benefits, and alternatives were explained to the patient. Questions regarding the procedure were encouraged and answered. The patient understands and consents to the procedure.  The patient is plate in the supine position on CT gantry table. Scout  CT the abdomen/pelvis was performed for planning purposes.  Once a site of approach was determined, the patient was prepped and draped in the usual sterile fashion. The skin and subcutaneous tissues of the approach area were generously infiltrated 1% lidocaine without epinephrine. A small stab inches in was made with 11 blade scalpel. Using CT guidance, a 15 cm trocar needle was advanced with the tip into the gas collection of the pelvis. The stylet was removed, and an 035 wire was placed into the cavity. The needle was removed over the wire. Dilation of the soft tissue tracks was performed with a [place] dilator.  We then placed a 10 French pigtail drain into the collection, with removal of the wire and the stiffener/cannula. The pigtail catheter was formed and a small sample was aspirated.  A final CT image was stored.  The patient tolerated the procedure well and remained hemodynamically stable throughout.  No complications were encountered and no significant blood loss was encountered.  COMPLICATIONS: None  FINDINGS: Scout CT of the abdomen demonstrates gas collection superior and anterior to the sigmoid colon. There is a gas and a small amount of fluid more dependently, inferior to the colon. The deeper collection appears to have no safe window, with overlying small bowel and sigmoid colon. This is also at the root of the small bowel mesenteric with adjacent neurovascular bundles.  Images during the case demonstrate safe placement of needle tip into the collection from a left anterior abdominal wall approach.  Final images demonstrate the pigtail catheter formed within the collection with decompression of the gas collection.  Small sample was sent to the lab for analysis.  IMPRESSION: Status post CT-guided drain of pelvic abscess. A sample was sent to the lab for analysis.  Signed,  Yvone NeuJaime S. Loreta AveWagner, DO  [facility]  [facility]   Electronically Signed   By: Gilmer MorJaime   Wagner D.O.   On: 08/18/2014 14:01    Scheduled Meds: . aspirin EC  81 mg Oral Daily  . ciprofloxacin  400 mg Intravenous Q12H  . enoxaparin (LOVENOX) injection  40 mg Subcutaneous Daily  . feeding supplement (RESOURCE BREEZE)  1 Container Oral BID BM  . metronidazole  500 mg Intravenous 3 times per day  . polyethylene glycol  17 g Oral BID  . sodium chloride  3 mL Intravenous Q12H  . thiamine  100 mg Oral Daily  . vitamin B-12  1,000 mcg Oral Daily   Continuous Infusions: . sodium chloride 100 mL/hr at 08/19/14 [id number]    Principal Problem:   Diverticulitis of colon with perforation Active Problems:   Hypertension   Alcohol use  Malnutrition of moderate degree   Diverticulitis of intestine with abscess without bleeding    Brayson Livesey K  [facility] Pager [phone]. If 7PM-7AM, please contact night-coverage at [email or web address], password [facility] 08/19/2014, 4:05 PM  LOS: 3 days

## 2014-08-19 NOTE — Progress Notes (Signed)
Pt's percutaneous Drain flushed with saline

## 2014-08-19 NOTE — Progress Notes (Signed)
[facility] PROGRESS NOTE Subjective Feels better, + flatus, tolerating clears  Objective: Vital signs in last 24 hours: Temp:  [98.3 F (36.8 C)-98.5 F (36.9 C)] 98.5 F (36.9 C) (05/02 0534) Pulse Rate:  [46-58] 58 (05/02 0534) Resp:  [9-18] 18 (05/02 0534) BP: (104-138)/(56-89) 138/89 mmHg (05/02 0534) SpO2:  [96 %-100 %] 99 % (05/02 0534) Weight:  [104 kg (229 lb 4.5 oz)] 104 kg (229 lb 4.5 oz) (05/02 0656) Last BM Date: 08/16/14  Intake/Output from previous day: 05/01 0701 - 05/02 0700 In: 1710 [P.O.:1000; I.V.:400; IV Piggyback:300] Out: 1325 [Urine:1300; Drains:25] Intake/Output this shift: Total I/O In: 1300 [P.O.:200; I.V.:1000; IV Piggyback:100] Out: 0   PE: General--NAD watching TV  Abdomen--mild tender, good BSs  Lab Results:  Recent Labs  08/16/14 1926 08/17/14 0616 08/19/14 0540  WBC 11.4* 9.1 7.6  HGB 12.8* 11.8* 11.9*  HCT 37.6* 34.9* 35.5*  PLT 367 299 310   BMET  Recent Labs  08/16/14 1926 08/17/14 0616  NA 139 139  K 4.6 4.3  CL 104 107  CO2 24 25  CREATININE 1.21 1.21   LFT  Recent Labs  08/16/14 1926 08/17/14 0616  PROT 7.4 6.0  AST 23 20  ALT 27 20  ALKPHOS 48 38*  BILITOT 0.5 0.4   PT/INR  Recent Labs  08/17/14 0616  LABPROT 15.2  INR 1.18   PANCREAS  Recent Labs  08/16/14 1926  LIPASE 33         Studies/Results: Ct Image Guided Drainage By Percutaneous Catheter  08/18/2014   CLINICAL DATA:  54 year old male with a history of diverticulitis and abscess.  EXAM: CT GUIDED DRAINAGE OF PELVIC ABSCESS  ANESTHESIA/SEDATION: 1.5 Mg IV Versed 75 mcg IV Fentanyl  Total Moderate Sedation Time:  17 minutes  PROCEDURE: The procedure, risks, benefits, and alternatives were explained to the patient. Questions regarding the procedure were encouraged and answered. The patient understands and consents to the procedure.  The patient is plate in the supine position on CT gantry table. Scout CT the abdomen/pelvis  was performed for planning purposes.  Once a site of approach was determined, the patient was prepped and draped in the usual sterile fashion. The skin and subcutaneous tissues of the approach area were generously infiltrated 1% lidocaine without epinephrine. A small stab inches in was made with 11 blade scalpel. Using CT guidance, a 15 cm trocar needle was advanced with the tip into the gas collection of the pelvis. The stylet was removed, and an 035 wire was placed into the cavity. The needle was removed over the wire. Dilation of the soft tissue tracks was performed with a [place] dilator.  We then placed a 10 French pigtail drain into the collection, with removal of the wire and the stiffener/cannula. The pigtail catheter was formed and a small sample was aspirated.  A final CT image was stored.  The patient tolerated the procedure well and remained hemodynamically stable throughout.  No complications were encountered and no significant blood loss was encountered.  COMPLICATIONS: None  FINDINGS: Scout CT of the abdomen demonstrates gas collection superior and anterior to the sigmoid colon. There is a gas and a small amount of fluid more dependently, inferior to the colon. The deeper collection appears to have no safe window, with overlying small bowel and sigmoid colon. This is also at the root of the small bowel mesenteric with adjacent neurovascular bundles.  Images during the case demonstrate safe placement of needle tip into the collection from  a left anterior abdominal wall approach.  Final images demonstrate the pigtail catheter formed within the collection with decompression of the gas collection.  Small sample was sent to the lab for analysis.  IMPRESSION: Status post CT-guided drain of pelvic abscess. A sample was sent to the lab for analysis.  Signed,  Yvone Neu. Loreta Ave, DO  [facility]  [facility]   Electronically Signed   By: Gilmer Mor D.O.   On:  08/18/2014 14:01    Medications: I have reviewed the patient's current medications.  Assessment/Plan: 1. Diverticulitis with Abcess. Has percutaneous drain. Doing well. Will advance to FLs   Krystan Northrop JR,Matie Dimaano L 08/19/2014, 9:44 AM  Diet:  No Change Anticoagulation: Follow-up Appointment:

## 2014-08-20 MED ORDER — ENSURE ENLIVE PO LIQD
237.0000 mL | Freq: Two times a day (BID) | ORAL | Status: DC
  Administered 2014-08-20: 237 mL via ORAL

## 2014-08-20 NOTE — Progress Notes (Signed)
Referring Physician(s): CCS  Subjective: Pt s/p placement of LLQ drain for perforated diverticulum. Pt doing well today. Ambulating well. No complaints. Would like to go home.  Allergies: Review of patient's allergies indicates no known allergies.  Medications: Prior to Admission medications   Medication Sig Start Date End Date Taking? Authorizing Provider  aspirin EC 81 MG tablet Take 81 mg by mouth daily.   Yes Historical Provider, MD  ciprofloxacin (CIPRO) 500 MG tablet Take 1 tablet (500 mg total) by mouth 2 (two) times daily. One po bid x 10 days 11/04/13  Yes April Palumbo, MD  FIBER PO Take 1 tablet by mouth daily.   Yes Historical Provider, MD  lisinopril-hydrochlorothiazide (PRINZIDE,ZESTORETIC) 20-12.5 MG per tablet Take 2 tablets by mouth daily. 07/29/14  Yes Historical Provider, MD  meloxicam (MOBIC) 15 MG tablet Take 15 mg by mouth 2 (two) times daily as needed for pain.  08/08/14  Yes Historical Provider, MD  metroNIDAZOLE (FLAGYL) 500 MG tablet Take 500 mg by mouth 2 (two) times daily. Started 08/14/14, for 10 days ending 08/23/14   Yes Historical Provider, MD  oxyCODONE-acetaminophen (PERCOCET) 5-325 MG per tablet Take 1 tablet by mouth every 4 (four) hours as needed for moderate pain. 04/13/14  Yes Dione Boozeavid Glick, MD  vitamin B-12 (CYANOCOBALAMIN) 1000 MCG tablet Take 1,000 mcg by mouth daily.   Yes Historical Provider, MD  metroNIDAZOLE (FLAGYL) 500 MG tablet Take 1 tablet (500 mg total) by mouth 3 (three) times daily. One po bid x 10 days 11/04/13   April Palumbo, MD  naproxen (NAPROSYN) 500 MG tablet Take 1 tablet (500 mg total) by mouth 2 (two) times daily. 04/13/14   Dione Boozeavid Glick, MD     Vital Signs: BP 138/75 mmHg  Pulse 46  Temp(Src) 98.1 F (36.7 C) (Oral)  Resp 16  Ht 6\' 1"  (1.854 m)  Wt 105.1 kg (231 lb 11.3 oz)  BMI 30.58 kg/m2  SpO2 96%  Physical Exam  Constitutional: He appears well-developed and well-nourished.  Abdominal:  Soft, NTND.  Drain in place  LLQ.   Minimal blood tinged output.  OP total about 15 mls  Nursing note and vitals reviewed.   Imaging: Ct Image Guided Drainage By Percutaneous Catheter  08/18/2014   CLINICAL DATA:  54 year old male with a history of diverticulitis and abscess.  EXAM: CT GUIDED DRAINAGE OF PELVIC ABSCESS  ANESTHESIA/SEDATION: 1.5 Mg IV Versed 75 mcg IV Fentanyl  Total Moderate Sedation Time:  17 minutes  PROCEDURE: The procedure, risks, benefits, and alternatives were explained to the patient. Questions regarding the procedure were encouraged and answered. The patient understands and consents to the procedure.  The patient is plate in the supine position on CT gantry table. Scout CT the abdomen/pelvis was performed for planning purposes.  Once a site of approach was determined, the patient was prepped and draped in the usual sterile fashion. The skin and subcutaneous tissues of the approach area were generously infiltrated 1% lidocaine without epinephrine. A small stab inches in was made with 11 blade scalpel. Using CT guidance, a 15 cm trocar needle was advanced with the tip into the gas collection of the pelvis. The stylet was removed, and an 035 wire was placed into the cavity. The needle was removed over the wire. Dilation of the soft tissue tracks was performed with a [place] dilator.  We then placed a 10 French pigtail drain into the collection, with removal of the wire and the stiffener/cannula. The pigtail catheter was formed  and a small sample was aspirated.  A final CT image was stored.  The patient tolerated the procedure well and remained hemodynamically stable throughout.  No complications were encountered and no significant blood loss was encountered.  COMPLICATIONS: None  FINDINGS: Scout CT of the abdomen demonstrates gas collection superior and anterior to the sigmoid colon. There is a gas and a small amount of fluid more dependently, inferior to the colon. The deeper collection appears to have no safe window,  with overlying small bowel and sigmoid colon. This is also at the root of the small bowel mesenteric with adjacent neurovascular bundles.  Images during the case demonstrate safe placement of needle tip into the collection from a left anterior abdominal wall approach.  Final images demonstrate the pigtail catheter formed within the collection with decompression of the gas collection.  Small sample was sent to the lab for analysis.  IMPRESSION: Status post CT-guided drain of pelvic abscess. A sample was sent to the lab for analysis.  Signed,  Yvone Neu. Loreta Ave, DO  [facility]  [facility]   Electronically Signed   By: Gilmer Mor D.O.   On: 08/18/2014 14:01    Labs:  CBC:  Recent Labs  11/04/13 0020 08/16/14 1926 08/17/14 0616 08/19/14 0540  WBC 11.0* 11.4* 9.1 7.6  HGB 16.6 12.8* 11.8* 11.9*  HCT 46.3 37.6* 34.9* 35.5*  PLT 175 367 299 310    COAGS:  Recent Labs  08/17/14 0616  INR 1.18    BMP:  Recent Labs  11/04/13 0020 08/16/14 1926 08/17/14 0616  NA 141 139 139  K 3.5* 4.6 4.3  CL 100 104 107  CO2 GLUCOSE 183* 83 98  BUN CALCIUM 9.7 9.1 8.2*  CREATININE 1.20 1.21 1.21  GFRNONAA 67* 66* 66*  GFRAA 78* 77* 77*    LIVER FUNCTION TESTS:  Recent Labs  11/04/13 0020 08/16/14 1926 08/17/14 0616  BILITOT 0.7 0.5 0.4  AST ALT ALKPHOS 46 48 38*  PROT 7.9 7.4 6.0  ALBUMIN 4.2 3.5 2.8*    Assessment and Plan: I reviewed surgery note and agree with plan. Ok to D/C home with drain in place. Nursing can instruct pt how to care for drain and empty and record out put.  He can f/u in [facility] clinic with CT scan for evaluation for removal of drain as outpatient. Pt will hear from IR drain clinic scheduler for time and date.  Signed: Lilymarie Scroggins A 08/20/2014, 2:20 PM   I spent a total of 15 Minutes in face to face in clinical consultation/evaluation, greater than 50% of which was  counseling/coordinating care for LLQ drain.

## 2014-08-20 NOTE — Progress Notes (Addendum)
[facility] PROGRESS NOTE  Jonathan Graves ZOX:[id number] DOB: Feb 04, 1961 DOA: 08/16/2014 PCP: Lenora BoysFRIED, ROBERT L, MD  Off Service Summary: [phone] who presented with acute diverticulitis with perforation. GI and general surgery consulted. The pt underwent perc tube placement by IR. Pt is improving with plans for dd/c with perc tube in place with close outpt follow up.  Assessment/Plan: 1. Diverticulitis with perforation 1. GI and General Surgery following 2. Pt is continued on cipro and flagyl 3. Pt is now s/p IR placement of perc tube 4. Per IR, pt to be discharged with tube in place with follow up CT and eventual removal of drain as outpatient 5. Pt is reporting continued symptomatic improvement 2. HTN 1. BP remains stable and controlled 2. Cont to monitor 3. Hx ETOH abuse 1. Stable thus far 2. No evidence of withdrawals 4. DVT prophylaxis 1. Lovenox on subQ while inpatient  Code Status: Full Family Communication: Pt in room Disposition Plan: Possible d/c in 24hrs   Consultants:  GI  General Surgery  IR  Procedures:  Perc tube drain placement 5/1  Antibiotics:  Ciprofloxacin 4/30>>>  Flagyl 4/30>>>  HPI/Subjective: No complaints. Reports abd pain has improved  Objective: Filed Vitals:   08/19/14 0656 08/19/14 1348 08/19/14 2149 08/20/14 0544  BP:  112/64 111/70 138/75  Pulse:  55 47 46  Temp:  98.7 F (37.1 C) 99.1 F (37.3 C) 98.1 F (36.7 C)  TempSrc:  Oral Oral Oral  Resp:  20 18 16   Height:      Weight: 104 kg (229 lb 4.5 oz)   105.1 kg (231 lb 11.3 oz)  SpO2:  97% 98% 96%    Intake/Output Summary (Last 24 hours) at 08/20/14 1809 Last data filed at 08/20/14 1758  Gross per 24 hour  Intake 2056.67 ml  Output      5 ml  Net 2051.67 ml   Filed Weights   08/18/14 0543 08/19/14 0656 08/20/14 0544  Weight: 104 kg (229 lb 4.5 oz) 104 kg (229 lb 4.5 oz) 105.1 kg (231 lb 11.3 oz)    Exam:   General:  Ambulating in hallway, in  nad  Cardiovascular: regular, s1, s2  Respiratory: normal resp effort, no wheezing, no crackles  Abdomen: soft, pos BS, min-mild abd tenderness, perc tube drain in place  Musculoskeletal: perfused, no clubbing   Data Reviewed: Basic Metabolic Panel:  Recent Labs Lab 08/16/14 1926 08/17/14 0616  NA 139 139  K 4.6 4.3  CL 104 107  CO2 24 25  GLUCOSE 83 98  BUN 12 9  CREATININE 1.21 1.21  CALCIUM 9.1 8.2*   Liver Function Tests:  Recent Labs Lab 08/16/14 1926 08/17/14 0616  AST 23 20  ALT 27 20  ALKPHOS 48 38*  BILITOT 0.5 0.4  PROT 7.4 6.0  ALBUMIN 3.5 2.8*    Recent Labs Lab 08/16/14 1926  LIPASE 33   No results for input(s): AMMONIA in the last 168 hours. CBC:  Recent Labs Lab 08/16/14 1926 08/17/14 0616 08/19/14 0540  WBC 11.4* 9.1 7.6  NEUTROABS 6.2  --   --   HGB 12.8* 11.8* 11.9*  HCT 37.6* 34.9* 35.5*  MCV 97.7 97.8 97.8  PLT 367 299 310   Cardiac Enzymes: No results for input(s): CKTOTAL, CKMB, CKMBINDEX, TROPONINI in the last 168 hours. BNP (last 3 results) No results for input(s): BNP in the last 8760 hours.  ProBNP (last 3 results) No results for input(s): PROBNP in the last 8760 hours.  CBG:  No results for input(s): GLUCAP in the last 168 hours.  Recent Results (from the past 240 hour(s))  Culture, routine-abscess     Status: None (Preliminary result)   Collection Time: 08/18/14 10:46 AM  Result Value Ref Range Status   Specimen Description ABSCESS DIVERTICULAR  Final   Special Requests NONE  Final   Gram Stain   Final    FEW WBC PRESENT, PREDOMINANTLY PMN NO SQUAMOUS EPITHELIAL CELLS SEEN NO ORGANISMS SEEN Performed at [facility]    Culture   Final    NO GROWTH 2 DAYS Performed at [facility]    Report Status PENDING  Incomplete     Studies: No results found.  Scheduled Meds: . aspirin EC  81 mg Oral Daily  . ciprofloxacin  400 mg Intravenous Q12H  . enoxaparin (LOVENOX) injection  40 mg  Subcutaneous Daily  . feeding supplement (ENSURE ENLIVE)  237 mL Oral BID BM  . metronidazole  500 mg Intravenous 3 times per day  . polyethylene glycol  17 g Oral BID  . sodium chloride  3 mL Intravenous Q12H  . thiamine  100 mg Oral Daily  . vitamin B-12  1,000 mcg Oral Daily   Continuous Infusions: . sodium chloride 100 mL/hr at 08/20/14 1733    Principal Problem:   Diverticulitis of colon with perforation Active Problems:   Hypertension   Alcohol use   Malnutrition of moderate degree   Diverticulitis of intestine with abscess without bleeding    Aalyssa Elderkin K  [facility] Pager [phone]. If 7PM-7AM, please contact night-coverage at [email or web address], password [facility] 08/20/2014, 6:09 PM  LOS: 4 days

## 2014-08-20 NOTE — Progress Notes (Signed)
Patient ID: Jonathan Graves, male   DOB: 09-05-1960, 54 y.o.   MRN: [id number]     [facility]      [address] [place]   [place], [place] [address]    Phone: [phone] FAX: [phone]     Subjective: Pain when he walks.  Tolerating fulls.  VSS.  Afebrile.    Objective:  Vital signs:  Filed Vitals:   08/19/14 0656 08/19/14 1348 08/19/14 2149 08/20/14 0544  BP:  112/64 111/70 138/75  Pulse:  55 47 46  Temp:  98.7 F (37.1 C) 99.1 F (37.3 C) 98.1 F (36.7 C)  TempSrc:  Oral Oral Oral  Resp:  Height:      Weight: 104 kg (229 lb 4.5 oz)   105.1 kg (231 lb 11.3 oz)  SpO2:  97% 98% 96%    Last BM Date: 08/16/14  Intake/Output   Yesterday:  05/02 0701 - 05/03 0700 In: 2853.3 [P.O.:540; I.V.:1988.3; IV Piggyback:300] Out: 1160 [Urine:1150; Drains:10] This shift: I/O last 3 completed shifts: In: 3563.3 [P.O.:540; I.V.:2388.3; Other:35; IV Piggyback:600] Out: 2085 [Urine:2050; Drains:35]    Physical Exam: General: Pt awake/alert/oriented x4 in no acute distress  Abdomen: Soft.  Nondistended.  Mild ttp llq, lateral to drain.  Drain with serosanguinous output.  No evidence of peritonitis.  No incarcerated hernias.   Problem List:   Principal Problem:   Diverticulitis of colon with perforation Active Problems:   Hypertension   Alcohol use   Malnutrition of moderate degree   Diverticulitis of intestine with abscess without bleeding    Results:   Labs: Results for orders placed or performed during the hospital encounter of 08/16/14 (from the past 48 hour(s))  Culture, routine-abscess     Status: None (Preliminary result)   Collection Time: 08/18/14 10:46 AM  Result Value Ref Range   Specimen Description ABSCESS DIVERTICULAR    Special Requests NONE    Gram Stain      FEW WBC PRESENT, PREDOMINANTLY PMN NO SQUAMOUS EPITHELIAL CELLS SEEN NO ORGANISMS SEEN Performed at [facility]    Culture       NO GROWTH 2 DAYS Performed at [facility]    Report Status PENDING   CBC     Status: Abnormal   Collection Time: 08/19/14  5:40 AM  Result Value Ref Range   WBC 7.6 4.0 - 10.5 K/uL   RBC 3.63 (L) 4.22 - 5.81 MIL/uL   Hemoglobin 11.9 (L) 13.0 - 17.0 g/dL   HCT 95.2 (L) 84.1 - 32.4 %   MCV 97.8 78.0 - 100.0 fL   MCH 32.8 26.0 - 34.0 pg   MCHC 33.5 30.0 - 36.0 g/dL   RDW 40.1 02.7 - 25.3 %   Platelets 310 150 - 400 K/uL    Imaging / Studies: Ct Image Guided Drainage By Percutaneous Catheter  08/18/2014   CLINICAL DATA:  54 year old male with a history of diverticulitis and abscess.  EXAM: CT GUIDED DRAINAGE OF PELVIC ABSCESS  ANESTHESIA/SEDATION: 1.5 Mg IV Versed 75 mcg IV Fentanyl  Total Moderate Sedation Time:  17 minutes  PROCEDURE: The procedure, risks, benefits, and alternatives were explained to the patient. Questions regarding the procedure were encouraged and answered. The patient understands and consents to the procedure.  The patient is plate in the supine position on CT gantry table. Scout CT the abdomen/pelvis was performed for planning purposes.  Once a site of approach was determined, the patient was prepped  and draped in the usual sterile fashion. The skin and subcutaneous tissues of the approach area were generously infiltrated 1% lidocaine without epinephrine. A small stab inches in was made with 11 blade scalpel. Using CT guidance, a 15 cm trocar needle was advanced with the tip into the gas collection of the pelvis. The stylet was removed, and an 035 wire was placed into the cavity. The needle was removed over the wire. Dilation of the soft tissue tracks was performed with a [place] dilator.  We then placed a 10 French pigtail drain into the collection, with removal of the wire and the stiffener/cannula. The pigtail catheter was formed and a small sample was aspirated.  A final CT image was stored.  The patient tolerated the procedure well and remained hemodynamically  stable throughout.  No complications were encountered and no significant blood loss was encountered.  COMPLICATIONS: None  FINDINGS: Scout CT of the abdomen demonstrates gas collection superior and anterior to the sigmoid colon. There is a gas and a small amount of fluid more dependently, inferior to the colon. The deeper collection appears to have no safe window, with overlying small bowel and sigmoid colon. This is also at the root of the small bowel mesenteric with adjacent neurovascular bundles.  Images during the case demonstrate safe placement of needle tip into the collection from a left anterior abdominal wall approach.  Final images demonstrate the pigtail catheter formed within the collection with decompression of the gas collection.  Small sample was sent to the lab for analysis.  IMPRESSION: Status post CT-guided drain of pelvic abscess. A sample was sent to the lab for analysis.  Signed,  Yvone Neu. Loreta Ave, DO  [facility]  [facility]   Electronically Signed   By: Gilmer Mor D.O.   On: 08/18/2014 14:01    Medications / Allergies:  Scheduled Meds: . aspirin EC  81 mg Oral Daily  . ciprofloxacin  400 mg Intravenous Q12H  . enoxaparin (LOVENOX) injection  40 mg Subcutaneous Daily  . feeding supplement (RESOURCE BREEZE)  1 Container Oral BID BM  . metronidazole  500 mg Intravenous 3 times per day  . polyethylene glycol  17 g Oral BID  . sodium chloride  3 mL Intravenous Q12H  . thiamine  100 mg Oral Daily  . vitamin B-12  1,000 mcg Oral Daily   Continuous Infusions: . sodium chloride 100 mL/hr at 08/19/14 1753   PRN Meds:.morphine injection, ondansetron **OR** ondansetron (ZOFRAN) IV, oxyCODONE-acetaminophen  Antibiotics: Anti-infectives    Start     Dose/Rate Route Frequency Ordered Stop   08/17/14 2300  metroNIDAZOLE (FLAGYL) IVPB 500 mg  Status:  Discontinued     500 mg 100 mL/hr over 60 Minutes Intravenous 3 times per day 08/16/14  2236 08/16/14 2236   08/17/14 0930  ciprofloxacin (CIPRO) IVPB 400 mg     400 mg 200 mL/hr over 60 Minutes Intravenous Every 12 hours 08/16/14 2234     08/16/14 2300  metroNIDAZOLE (FLAGYL) IVPB 500 mg     500 mg 100 mL/hr over 60 Minutes Intravenous 3 times per day 08/16/14 2236     08/16/14 2230  metroNIDAZOLE (FLAGYL) IVPB 500 mg  Status:  Discontinued     500 mg 100 mL/hr over 60 Minutes Intravenous Every 8 hours 08/16/14 2227 08/16/14 2236   08/16/14 2015  ciprofloxacin (CIPRO) IVPB 400 mg     400 mg 200 mL/hr over 60 Minutes Intravenous  Once 08/16/14 2012 08/16/14  2229   08/16/14 2015  metroNIDAZOLE (FLAGYL) IVPB 500 mg  Status:  Discontinued     500 mg 100 mL/hr over 60 Minutes Intravenous  Once 08/16/14 2012 08/19/14 1022        Assessment/Plan HD#4 diverticulitis with macroscopic perforation S/p IR drain placement 5/1 -advance to soft diet -dietician consult -CBC in AM -follow cultures, negative to date -cipro/flagyl D#3/14 dispo-anticipate DC in the next 24h if tolerates PO with cipro/flagyl for total 14 days, follow up in drain clinic, repeat CT and CCS  Ashok NorrisEmina Tayvien Kane, ANP-BC [facility] Pager [phone](7A-4:30P) For consults and floor pages call [phone](7A-4:30P)  08/20/2014 8:16 AM

## 2014-08-20 NOTE — Progress Notes (Signed)
NUTRITION FOLLOW UP  Pt meets criteria for MODERAT MALNUTRITION in the context of ACUTE ILLNESS as evidenced by an estimated intake of <75% estimated needs for > 7 days and an estimated weight loss of 1% bw in 1 week  Intervention:   -Low Fiber diet education provided -D/c Resource Breeze po TID, each supplement provides 250 kcal and 9 grams of protein -Ensure Enlive po BID, each supplement provides 350 kcal and 20 grams of protein  Nutrition Dx:   Inadequate oral intake related to acute severe abdominal pain as evidenced by an estimated intake of <75% of normal for past 2 weeks; progressing  Goal:   Pt to meet >/= 90% of their estimated nutrition needs; progressing  Monitor:   Oral intake, weight, labs, diet order  Assessment:   54 y.o. male with PMHx hypertension+ Diverticulitis The patient is presenting with complaints of left lower quadrant abdominal pain ongoing for last 1 week. CT scan shows perforation w/ abscess. Current Bowel rest  RD received consult for low fiber diet education. Pt reports his appetite has improved since this admission. He consumed his grits, eggs, and juice this morning. He is looking forward to his diet advancement and reports he is craving more substantial foods. He is not consuming the [employer]. He reveals he typically follows a low fiber diet at home and is frustrated about hospitalization. He reports he follows a gastroenterologist and plans to follow-up with him after discharge.   RD provided "Fiber Restricted Nutrition Therapy" handout from the Academy of Nutrition and Dietetics. Discussed rationale for diet and discussed low fiber foods from each food group as well as high fiber foods to avoid. Discussed importance of slowly adding high fiber foods back into diet and continuing high fiber diet to avoid flare-ups. Encouraged adequate fluid consumption, particularly when adding fiber back into diet. Teach back method used.  Labs reviewed. Calcium:  8.2.   Height: Ht Readings from Last 1 Encounters:  08/16/14  (1.854 m)    Weight Status:   Wt Readings from Last 1 Encounters:  08/20/14 231 lb 11.3 oz (105.1 kg)   08/17/14 228 lb 6.3 oz (103.6 kg)        Re-estimated needs:  Kcal: 2000-2200 Protein: 95-105 grams Fluid: 2.0-2.2 L  Skin: closed system abdominal drain  Diet Order: DIET SOFT Room service appropriate?: Yes; Fluid consistency:: Thin   Intake/Output Summary (Last 24 hours) at 08/20/14 1429 Last data filed at 08/20/14 1344  Gross per 24 hour  Intake   1660 ml  Output    555 ml  Net   1105 ml    Last BM: 08/19/14   Labs:   Recent Labs Lab 08/16/14 1926 08/17/14 0616  NA 139 139  K 4.6 4.3  CL 104 107  CO2 24 25  BUN 12 9  CREATININE 1.21 1.21  CALCIUM 9.1 8.2*  GLUCOSE 83 98    CBG (last 3)  No results for input(s): GLUCAP in the last 72 hours.  Scheduled Meds: . aspirin EC  81 mg Oral Daily  . ciprofloxacin  400 mg Intravenous Q12H  . enoxaparin (LOVENOX) injection  40 mg Subcutaneous Daily  . feeding supplement (RESOURCE BREEZE)  1 Container Oral BID BM  . metronidazole  500 mg Intravenous 3 times per day  . polyethylene glycol  17 g Oral BID  . sodium chloride  3 mL Intravenous Q12H  . thiamine  100 mg Oral Daily  . vitamin B-12  1,000 mcg  Oral Daily    Continuous Infusions: . sodium chloride 100 mL/hr at 08/19/14 1753    Captola Teschner A. Mayford KnifeWilliams, RD, LDN, CDE Pager: [phone] After hours Pager: [phone]

## 2014-08-21 LAB — CBC
HCT: 36.2 % — ABNORMAL LOW (ref 39.0–52.0)
Hemoglobin: 12.3 g/dL — ABNORMAL LOW (ref 13.0–17.0)
MCH: 32.9 pg (ref 26.0–34.0)
MCHC: 34 g/dL (ref 30.0–36.0)
MCV: 96.8 fL (ref 78.0–100.0)
PLATELETS: 290 10*3/uL (ref 150–400)
RBC: 3.74 MIL/uL — AB (ref 4.22–5.81)
RDW: 13.3 % (ref 11.5–15.5)
WBC: 7.1 10*3/uL (ref 4.0–10.5)

## 2014-08-21 MED ORDER — METRONIDAZOLE 500 MG PO TABS: 500.0000 mg | ORAL_TABLET | Freq: Three times a day (TID) | ORAL | Status: DC

## 2014-08-21 MED ORDER — CIPROFLOXACIN HCL 500 MG PO TABS
500.0000 mg | ORAL_TABLET | Freq: Two times a day (BID) | ORAL | Status: DC
  Administered 2014-08-21: 500 mg via ORAL
  Filled 2014-08-21 (×3): qty 1

## 2014-08-21 MED ORDER — METRONIDAZOLE 500 MG PO TABS
500.0000 mg | ORAL_TABLET | Freq: Three times a day (TID) | ORAL | Status: DC
  Administered 2014-08-21: 500 mg via ORAL
  Filled 2014-08-21 (×3): qty 1

## 2014-08-21 NOTE — Progress Notes (Signed)
[facility] PROGRESS NOTE Subjective patient feels much better. Tolerating diet abdominal discomfort is improved. Very little coming out of drain.  Objective: Vital signs in last 24 hours: Temp:  [98.3 F (36.8 C)-98.9 F (37.2 C)] 98.3 F (36.8 C) (05/04 0558) Pulse Rate:  [47-59] 47 (05/04 0558) Resp:  [16-18] 16 (05/04 0558) BP: (139-142)/(75-78) 142/75 mmHg (05/04 0558) SpO2:  [96 %-98 %] 96 % (05/04 0558) Weight:  [103.828 kg (228 lb 14.4 oz)] 103.828 kg (228 lb 14.4 oz) (05/04 0558) Last BM Date: 08/20/14  Intake/Output from previous day: 05/03 0701 - 05/04 0700 In: 1841.7 [P.O.:240; I.V.:1096.7; IV Piggyback:500] Out: 14 [Drains:14] Intake/Output this shift:    PE: General--no acute distress  Abdomen-- nondistended soft and nontender  Lab Results:  Recent Labs  08/19/14 0540 08/21/14 0547  WBC 7.6 7.1  HGB 11.9* 12.3*  HCT 35.5* 36.2*  PLT 310 290   BMET No results for input(s): NA, K, CL, CO2, CREATININE in the last 72 hours. LFT No results for input(s): PROT, AST, ALT, ALKPHOS, BILITOT, BILIDIR, IBILI in the last 72 hours. PT/INR No results for input(s): LABPROT, INR in the last 72 hours. PANCREAS No results for input(s): LIPASE in the last 72 hours.       Studies/Results: No results found.  Medications: I have reviewed the patient's current medications.  Assessment/Plan: 1. Diverticulitis with perforation abscess. Percutaneous drain in place. Patient on antibiotics and tolerating diet. Long discussion with him about the future of his diverticular disease. He will be discharged and followed up in the IR clinic in suspected drain will be removed in several weeks. Would have him follow-up with Dr. Bosie ClosSchooler in about 6 weeks. The next several months he should probably have another colonoscopy and discuss with Dr. Bosie ClosSchooler whether or not to consider elective sigmoid resection. Will sign off for now but please call us back if there is any  other problems.   Kwana Ringel JR,Diavion Labrador L 08/21/2014, 8:17 AM  Pager: [phone] If no answer or after hours call [phone]

## 2014-08-21 NOTE — Progress Notes (Signed)
Patient discharge teaching given, including activity, diet, follow-up appoints, and medications. Patient verbalized understanding of all discharge instructions. IV access was d/c'd. Vitals are stable. Skin is intact except as charted in most recent assessments. Home health will be set up by case management with [facility] for drain care and for drain to be flushed daily by RN and patient is okay with this. Pt to be escorted out by NT, to be driven home by family.

## 2014-08-21 NOTE — Discharge Summary (Signed)
Physician Discharge Summary  [name] ZOX:[id number] DOB: 19-Jul-1960 DOA: 08/16/2014  PCP: Lenora Boys, MD  Admit date: 08/16/2014 Discharge date: 08/21/2014   Recommendations for Outpatient Follow-Up:   1. Home health RN setup to assist with teaching Graves how to care for abscess drain with daily flushes. 2. Recommend PCP, surgery or gastroenterology follows up on final abscess culture results.   Discharge Diagnosis:   Principal Problem:    Diverticulitis of colon with perforation Active Problems:    Hypertension    Alcohol use    Malnutrition of moderate degree    Diverticulitis of intestine with abscess without bleeding   Discharge disposition:  Home.    Discharge Condition: Improved.  Diet recommendation:  Regular.  Wound care: Flush abscess drain with 5 mL of normal saline daily.   History of Present Illness:   Jonathan Graves who presented with acute diverticulitis with perforation. GI and general surgery consulted. Jonathan pt underwent perc tube placement by IR.  Hospital Course by Problem:   Principal Problem:   Diverticulitis of colon with perforation - Graves treated with 5 days of Cipro/Flagyl, will resume oral Cipro/Flagyl for an additional 5 days at discharge. - Status post abscess drain placement by IR on 08/18/14. - Home health nursing services set up to care for drain and provide daily flushes. - Abscess cultures negative to date.  Active Problems:   Hypertension - Controlled. Continue Zestoretic.    Alcohol use - No evidence of withdrawal symptoms while in Jonathan hospital.    Malnutrition of moderate degree - Provided with nutritional supplements.   Medical Consultants:    Dr. Charlott Rakes, Gastroenterology  Dr. Axel Filler, Surgery  Dr. Ruthy Dick, Interventional Radiology   Discharge Exam:   Filed Vitals:   08/21/14 0558  BP: 142/75  Pulse: 47  Temp: 98.3 F (36.8 C)  Resp: 16   Filed Vitals:   08/19/14 2149  08/20/14 0544 08/20/14 2125 08/21/14 0558  BP: 111/70 138/75 139/78 142/75  Pulse: 47 46 59 47  Temp: 99.1 F (37.3 C) 98.1 F (36.7 C) 98.9 F (37.2 C) 98.3 F (36.8 C)  TempSrc: Oral Oral Oral Oral  Resp: 18 16 18 16   Height:      Weight:  105.1 kg (231 lb 11.3 oz)  103.828 kg (228 lb 14.4 oz)  SpO2: 98% 96% 98% 96%    Gen:  NAD Cardiovascular:  RRR, No M/R/G Respiratory: Lungs CTAB Gastrointestinal: Abdomen soft, NT/ND with normal active bowel sounds. Extremities: No C/E/C   Jonathan results of significant diagnostics from this hospitalization (including imaging, microbiology, ancillary and laboratory) are listed below for reference.     Procedures and Diagnostic Studies:   Ct Abdomen Pelvis W Contrast  08/16/2014   CLINICAL DATA:  Left lower quadrant pain x1.5 weeks  EXAM: CT ABDOMEN AND PELVIS WITH CONTRAST  TECHNIQUE: Multidetector CT imaging of Jonathan abdomen and pelvis was performed using Jonathan standard protocol following bolus administration of intravenous contrast.  CONTRAST:  125 mL Isovue 300 IV  COMPARISON:  11/04/2013  FINDINGS: Lower chest:  Lung bases are clear.  Coronary atherosclerosis.  Hepatobiliary: 6 mm hypervascular lesion inferiorly in Jonathan posterior segment right hepatic lobe (series 3/ image 27), technically indeterminate but suggestive of a flash filling hemangioma.  Gallbladder is unremarkable. No intrahepatic or extrahepatic ductal dilatation.  Pancreas: Within normal limits.  Spleen: Within normal limits.  Adrenals/Urinary Tract: Adrenal glands are within normal limits.  Kidneys are within normal limits.  No hydronephrosis.  Bladder is within normal limits.  Stomach/Bowel: Stomach is within normal limits.  No evidence of bowel obstruction.  Normal appendix.  Sigmoid colonic wall thickening/ inflammatory changes (series 3/image 74), suggesting sigmoid diverticulitis.  Associated localized, irregular gas collection in Jonathan lower abdomen measuring 9.6 x 6.3 x 5.6 cm (series  3/ image 65), suggesting localized macroscopic perforation. Minimal layering fluid dependently within one portion of Jonathan collection (series 3/image 69). Overall collection reflects multiple smaller air-filled spaces and does not have a single well-defined rim.  No free air.  Vascular/Lymphatic: Atherosclerotic calcifications of Jonathan abdominal aorta and branch vessels.  Small upper abdominal/ retroperitoneal lymph nodes which do not meet pathologic CT size criteria, likely reactive.  Reproductive: Prostate is unremarkable.  Other: No abdominopelvic ascites.  Musculoskeletal: Degenerative changes of Jonathan visualized thoracolumbar spine.  IMPRESSION: Sigmoid colonic wall thickening/ inflammatory changes, suggesting sigmoid diverticulitis. Consider follow-up colonoscopy as clinically warranted.  Associated 9.6 x 6.3 x 5.6 cm irregular gas collection in Jonathan lower abdomen, as described above, suggesting localized macroscopic perforation. No free air.   Electronically Signed   By: Charline Bills M.D.   On: 08/16/2014 14:06   Ct Image Guided Drainage By Percutaneous Catheter  08/18/2014   CLINICAL DATA:  54 year old male with a history of diverticulitis and abscess.  EXAM: CT GUIDED DRAINAGE OF PELVIC ABSCESS  ANESTHESIA/SEDATION: 1.5 Mg IV Versed 75 mcg IV Fentanyl  Total Moderate Sedation Time:  17 minutes  PROCEDURE: Jonathan procedure, risks, benefits, and alternatives were explained to Jonathan Graves. Questions regarding Jonathan procedure were encouraged and answered. Jonathan Graves understands and consents to Jonathan procedure.  Jonathan Graves is plate in Jonathan supine position on CT gantry table. Scout CT Jonathan abdomen/pelvis was performed for planning purposes.  Once a site of approach was determined, Jonathan Graves was prepped and draped in Jonathan usual sterile fashion. Jonathan skin and subcutaneous tissues of Jonathan approach area were generously infiltrated 1% lidocaine without epinephrine. A small stab inches in was made with 11 blade scalpel. Using  CT guidance, a 15 cm trocar needle was advanced with Jonathan tip into Jonathan gas collection of Jonathan pelvis. Jonathan stylet was removed, and an 035 wire was placed into Jonathan cavity. Jonathan needle was removed over Jonathan wire. Dilation of Jonathan soft tissue tracks was performed with a [place] dilator.  We then placed a 10 French pigtail drain into Jonathan collection, with removal of Jonathan wire and Jonathan stiffener/cannula. Jonathan pigtail catheter was formed and a small sample was aspirated.  A final CT image was stored.  Jonathan Graves tolerated Jonathan procedure well and remained hemodynamically stable throughout.  No complications were encountered and no significant blood loss was encountered.  COMPLICATIONS: None  FINDINGS: Scout CT of Jonathan abdomen demonstrates gas collection superior and anterior to Jonathan sigmoid colon. There is a gas and a small amount of fluid more dependently, inferior to Jonathan colon. Jonathan deeper collection appears to have no safe window, with overlying small bowel and sigmoid colon. This is also at Jonathan root of Jonathan small bowel mesenteric with adjacent neurovascular bundles.  Images during Jonathan case demonstrate safe placement of needle tip into Jonathan collection from a left anterior abdominal wall approach.  Final images demonstrate Jonathan pigtail catheter formed within Jonathan collection with decompression of Jonathan gas collection.  Small sample was sent to Jonathan lab for analysis.  IMPRESSION: Status post CT-guided drain of pelvic abscess. A sample was sent to Jonathan lab for analysis.  Signed,  Yvone Neu. Loreta Ave, DO  [facility]  [facility]   Electronically Signed   By: Gilmer MorJaime  Wagner D.O.   On: 08/18/2014 14:01     Labs:   Basic Metabolic Panel:  Recent Labs Lab 08/16/14 1926 08/17/14 0616  NA 139 139  K 4.6 4.3  CL 104 107  CO2 24 25  GLUCOSE 83 98  BUN 12 9  CREATININE 1.21 1.21  CALCIUM 9.1 8.2*   GFR Estimated Creatinine Clearance: 88.3 mL/min (by C-G formula based on Cr of 1.21). Liver  Function Tests:  Recent Labs Lab 08/16/14 1926 08/17/14 0616  AST 23 20  ALT 27 20  ALKPHOS 48 38*  BILITOT 0.5 0.4  PROT 7.4 6.0  ALBUMIN 3.5 2.8*    Recent Labs Lab 08/16/14 1926  LIPASE 33   Coagulation profile  Recent Labs Lab 08/17/14 0616  INR 1.18    CBC:  Recent Labs Lab 08/16/14 1926 08/17/14 0616 08/19/14 0540 08/21/14 0547  WBC 11.4* 9.1 7.6 7.1  NEUTROABS 6.2  --   --   --   HGB 12.8* 11.8* 11.9* 12.3*  HCT 37.6* 34.9* 35.5* 36.2*  MCV 97.7 97.8 97.8 96.8  PLT 367 299 310 290   Microbiology Recent Results (from Jonathan past 240 hour(s))  Culture, routine-abscess     Status: None (Preliminary result)   Collection Time: 08/18/14 10:46 AM  Result Value Ref Range Status   Specimen Description ABSCESS DIVERTICULAR  Final   Special Requests NONE  Final   Gram Stain   Final    FEW WBC PRESENT, PREDOMINANTLY PMN NO SQUAMOUS EPITHELIAL CELLS SEEN NO ORGANISMS SEEN Performed at [facility]    Culture   Final    NO GROWTH 3 DAYS Performed at [facility]    Report Status PENDING  Incomplete     Discharge Instructions:   Discharge Instructions    Call MD for:  persistant nausea and vomiting    Complete by:  As directed      Call MD for:  severe uncontrolled pain    Complete by:  As directed      Call MD for:  temperature >100.4    Complete by:  As directed      Diet general    Complete by:  As directed      Discharge instructions    Complete by:  As directed   You were cared for by Dr. Hillery Aldohristina Rama  (a hospitalist) during your hospital stay. If you have any questions about your discharge medications or Jonathan care you received while you were in Jonathan hospital after you are discharged, you can call Jonathan unit and ask to speak with Jonathan hospitalist on call if Jonathan hospitalist that took care of you is not available. Once you are discharged, your primary care physician will handle any further medical issues. Please note that NO REFILLS for  any discharge medications will be authorized once you are discharged, as it is imperative that you return to your primary care physician (or establish a relationship with a primary care physician if you do not have one) for your aftercare needs so that they can reassess your need for medications and monitor your lab values.  Any outstanding tests can be reviewed by your PCP at your follow up visit.  It is also important to review any medicine changes with your PCP.  Please bring these d/c instructions with you to your next visit so your physician can review these changes with  you.  If you do not have a primary care physician, you can call [phone] for a physician referral.  It is highly recommended that you obtain a PCP for hospital follow up.     Face-to-face encounter (required for Medicare/Medicaid patients)    Complete by:  As directed   I RAMA,CHRISTINA certify that this Graves is under my care and that I, or a nurse practitioner or physician's assistant working with me, had a face-to-face encounter that meets Jonathan physician face-to-face encounter requirements with this Graves on 08/21/2014. Jonathan encounter with Jonathan Graves was in whole, or in part for Jonathan following medical condition(s) which is Jonathan primary reason for home health care (List medical condition): Diverticulitis with abscess drain in placed, needs drain flushed with 5 mL of normal saline daily. Please teach Graves how to care for drain and flush daily.  Jonathan encounter with Jonathan Graves was in whole, or in part, for Jonathan following medical condition, which is Jonathan primary reason for home health care:  Diverticulitis with abscess drain in place  I certify that, based on my findings, Jonathan following services are medically necessary home health services:  Nursing  Reason for Medically Necessary Home Health Services:  Skilled Nursing- Teaching of Disease Process/Symptom Management  My clinical findings support Jonathan need for Jonathan above services:  Pain  interferes with ambulation/mobility  Further, I certify that my clinical findings support that this Graves is homebound due to:  Pain interferes with ambulation/mobility     Home Health    Complete by:  As directed   To provide Jonathan following care/treatments:  RN     Increase activity slowly    Complete by:  As directed             Medication List    TAKE these medications        aspirin EC 81 MG tablet  Take 81 mg by mouth daily.     ciprofloxacin 500 MG tablet  Commonly known as:  CIPRO  Take 1 tablet (500 mg total) by mouth 2 (two) times daily. One po bid x 10 days     FIBER PO  Take 1 tablet by mouth daily.     lisinopril-hydrochlorothiazide 20-12.5 MG per tablet  Commonly known as:  PRINZIDE,ZESTORETIC  Take 2 tablets by mouth daily.     meloxicam 15 MG tablet  Commonly known as:  MOBIC  Take 15 mg by mouth 2 (two) times daily as needed for pain.     metroNIDAZOLE 500 MG tablet  Commonly known as:  FLAGYL  Take 1 tablet (500 mg total) by mouth 3 (three) times daily. One po bid x 10 days     naproxen 500 MG tablet  Commonly known as:  NAPROSYN  Take 1 tablet (500 mg total) by mouth 2 (two) times daily.     oxyCODONE-acetaminophen 5-325 MG per tablet  Commonly known as:  PERCOCET  Take 1 tablet by mouth every 4 (four) hours as needed for moderate pain.     vitamin B-12 1000 MCG tablet  Commonly known as:  CYANOCOBALAMIN  Take 1,000 mcg by mouth daily.           Follow-up Information    Schedule an appointment as soon as possible for a visit with FRIED, Doris CheadleOBERT L, MD.   Specialty:  Family Medicine   Why:  As needed   Contact information:   [address] [facility] [address] [place] [address] [phone]  Follow up with SCHOOLER,VINCENT C., MD. Schedule an appointment as soon as possible for a visit in 8 weeks.   Specialty:  Gastroenterology   Why:  Hospital follow up.   Contact information:   [address] [place]  [address] [phone]       Follow up with Marigene Ehlers., Jed Limerick, MD. Schedule an appointment as soon as possible for a visit in 2 weeks.   Specialty:  General Surgery   Why:  Hospital follow up.   Contact information:   [address] [place] [address] [phone]        Time coordinating discharge: 35 minutes.  Signed:  RAMA,CHRISTINA  Pager [phone] [facility] 08/21/2014, 2:30 PM

## 2014-08-21 NOTE — Discharge Instructions (Signed)
Diverticulitis Diverticulitis is inflammation or infection of small pouches in your colon that form when you have a condition called diverticulosis. The pouches in your colon are called diverticula. Your colon, or large intestine, is where water is absorbed and stool is formed. Complications of diverticulitis can include:  Bleeding.  Severe infection.  Severe pain.  Perforation of your colon.  Obstruction of your colon. CAUSES  Diverticulitis is caused by bacteria. Diverticulitis happens when stool becomes trapped in diverticula. This allows bacteria to grow in the diverticula, which can lead to inflammation and infection. RISK FACTORS People with diverticulosis are at risk for diverticulitis. Eating a diet that does not include enough fiber from fruits and vegetables may make diverticulitis more likely to develop. SYMPTOMS  Symptoms of diverticulitis may include:  Abdominal pain and tenderness. The pain is normally located on the left side of the abdomen, but may occur in other areas.  Fever and chills.  Bloating.  Cramping.  Nausea.  Vomiting.  Constipation.  Diarrhea.  Blood in your stool. DIAGNOSIS  Your health care provider will ask you about your medical history and do a physical exam. You may need to have tests done because many medical conditions can cause the same symptoms as diverticulitis. Tests may include:  Blood tests.  Urine tests.  Imaging tests of the abdomen, including X-rays and CT scans. When your condition is under control, your health care provider may recommend that you have a colonoscopy. A colonoscopy can show how severe your diverticula are and whether something else is causing your symptoms. TREATMENT  Most cases of diverticulitis are mild and can be treated at home. Treatment may include:  Taking over-the-counter pain medicines.  Following a clear liquid diet.  Taking antibiotic medicines by mouth for 7-10 days. More severe cases may  be treated at a hospital. Treatment may include:  Not eating or drinking.  Taking prescription pain medicine.  Receiving antibiotic medicines through an IV tube.  Receiving fluids and nutrition through an IV tube.  Surgery. HOME CARE INSTRUCTIONS   Follow your health care provider's instructions carefully.  Follow a full liquid diet or other diet as directed by your health care provider. After your symptoms improve, your health care provider may tell you to change your diet. He or she may recommend you eat a high-fiber diet. Fruits and vegetables are good sources of fiber. Fiber makes it easier to pass stool.  Take fiber supplements or probiotics as directed by your health care provider.  Only take medicines as directed by your health care provider.  Keep all your follow-up appointments. SEEK MEDICAL CARE IF:   Your pain does not improve.  You have a hard time eating food.  Your bowel movements do not return to normal. SEEK IMMEDIATE MEDICAL CARE IF:   Your pain becomes worse.  Your symptoms do not get better.  Your symptoms suddenly get worse.  You have a fever.  You have repeated vomiting.  You have bloody or black, tarry stools. MAKE SURE YOU:   Understand these instructions.  Will watch your condition.  Will get help right away if you are not doing well or get worse. Document Released: 01/13/2005 Document Revised: 04/10/2013 Document Reviewed: 02/28/2013 [facility] Patient Information 2015 [place], [place]. This information is not intended to replace advice given to you by your health care provider. Make sure you discuss any questions you have with your health care provider.  Low-Fiber Diet for 2-3 weeks, then switch to high fiber Fiber is found  in fruits, vegetables, and whole grains. A low-fiber diet restricts fibrous foods that are not digested in the small intestine. A diet containing about 10-15 grams of fiber per day is considered low fiber. Low-fiber diets  may be used to:  Promote healing and rest the bowel during intestinal flare-ups.  Prevent blockage of a partially obstructed or narrowed gastrointestinal tract.  Reduce fecal weight and volume.  Slow the movement of feces. You may be on a low-fiber diet as a transitional diet following surgery, after an injury (trauma), or because of a short (acute) or lifelong (chronic) illness. Your health care provider will determine the length of time you need to stay on this diet.  WHAT DO I NEED TO KNOW ABOUT A LOW-FIBER DIET? Always check the fiber content on the packaging's Nutrition Facts label, especially on foods from the grains list. Ask your dietitian if you have questions about specific foods that are related to your condition, especially if the food is not listed below. In general, a low-fiber food will have less than 2 g of fiber. WHAT FOODS CAN I EAT? Grains All breads and crackers made with white flour. Sweet rolls, doughnuts, waffles, pancakes, [place] toast, bagels. Pretzels, Melba toast, zwieback. Well-cooked cereals, such as cornmeal, farina, or cream cereals. Dry cereals that do not contain whole grains, fruit, or nuts, such as refined corn, wheat, rice, and oat cereals. Potatoes prepared any way without skins, plain pastas and noodles, refined white rice. Use white flour for baking and making sauces. Use allowed list of grains for casseroles, dumplings, and puddings.  Vegetables Strained tomato and vegetable juices. Fresh lettuce, cucumber, spinach. Well-cooked (no skin or pulp) or canned vegetables, such as asparagus, bean sprouts, beets, carrots, green beans, mushrooms, potatoes, pumpkin, spinach, yellow squash, tomato sauce/puree, turnips, yams, and zucchini. Keep servings limited to  cup.  Fruits All fruit juices except prune juice. Cooked or canned fruits without skin and seeds, such as applesauce, apricots, cherries, fruit cocktail, grapefruit, grapes, mandarin oranges, melons,  peaches, pears, pineapple, and plums. Fresh fruits without skin, such as apricots, avocados, bananas, melons, pineapple, nectarines, and peaches. Keep servings limited to  cup or 1 piece.  Meat and Other Protein Sources Ground or well-cooked tender beef, ham, veal, lamb, pork, or poultry. Eggs, plain cheese. Fish, oysters, shrimp, lobster, and other seafood. Liver, organ meats. Smooth nut butters. Dairy All milk products and alternative dairy substitutes, such as soy, rice, almond, and coconut, not containing added whole nuts, seeds, or added fruit. Beverages Decaf coffee, fruit, and vegetable juices or smoothies (small amounts, with no pulp or skins, and with fruits from allowed list), sports drinks, herbal tea. Condiments Ketchup, mustard, vinegar, cream sauce, cheese sauce, cocoa powder. Spices in moderation, such as allspice, basil, bay leaves, celery powder or leaves, cinnamon, cumin powder, curry powder, ginger, mace, marjoram, onion or garlic powder, oregano, paprika, parsley flakes, ground pepper, rosemary, sage, savory, tarragon, thyme, and turmeric. Sweets and Desserts Plain cakes and cookies, pie made with allowed fruit, pudding, custard, cream pie. Gelatin, fruit, ice, sherbet, frozen ice pops. Ice cream, ice milk without nuts. Plain hard candy, honey, jelly, molasses, syrup, sugar, chocolate syrup, gumdrops, marshmallows. Limit overall sugar intake.  Fats and Oil Margarine, butter, cream, mayonnaise, salad oils, plain salad dressings made from allowed foods. Choose healthy fats such as olive oil, canola oil, and omega-3 fatty acids (such as found in salmon or tuna) when possible.  Other Bouillon, broth, or cream soups made from allowed foods. Any  strained soup. Casseroles or mixed dishes made with allowed foods. The items listed above may not be a complete list of recommended foods or beverages. Contact your dietitian for more options.  WHAT FOODS ARE NOT RECOMMENDED? Grains All  whole wheat and whole grain breads and crackers. Multigrains, rye, bran seeds, nuts, or coconut. Cereals containing whole grains, multigrains, bran, coconut, nuts, raisins. Cooked or dry oatmeal, steel-cut oats. Coarse wheat cereals, granola. Cereals advertised as high fiber. Potato skins. Whole grain pasta, wild or brown rice. Popcorn. Coconut flour. Bran, buckwheat, corn bread, multigrains, rye, wheat germ.  Vegetables Fresh, cooked or canned vegetables, such as artichokes, asparagus, beet greens, broccoli, Brussels sprouts, cabbage, celery, cauliflower, corn, eggplant, kale, legumes or beans, okra, peas, and tomatoes. Avoid large servings of any vegetables, especially raw vegetables.  Fruits Fresh fruits, such as apples with or without skin, berries, cherries, figs, grapes, grapefruit, guavas, kiwis, mangoes, oranges, papayas, pears, persimmons, pineapple, and pomegranate. Prune juice and juices with pulp, stewed or dried prunes. Dried fruits, dates, raisins. Fruit seeds or skins. Avoid large servings of all fresh fruits. Meats and Other Protein Sources Tough, fibrous meats with gristle. Chunky nut butter. Cheese made with seeds, nuts, or other foods not recommended. Nuts, seeds, legumes (beans, including baked beans), dried peas, beans, lentils.  Dairy Yogurt or cheese that contains nuts, seeds, or added fruit.  Beverages Fruit juices with high pulp, prune juice. Caffeinated coffee and teas.  Condiments Coconut, maple syrup, pickles, olives. Sweets and Desserts Desserts, cookies, or candies that contain nuts or coconut, chunky peanut butter, dried fruits. Jams, preserves with seeds, marmalade. Large amounts of sugar and sweets. Any other dessert made with fruits from the not recommended list.  Other Soups made from vegetables that are not recommended or that contain other foods not recommended.  The items listed above may not be a complete list of foods and beverages to avoid. Contact your  dietitian for more information. Document Released: 09/25/2001 Document Revised: 04/10/2013 Document Reviewed: 02/26/2013 [facility] Patient Information 2015 [place], [place]. This information is not intended to replace advice given to you by your health care provider. Make sure you discuss any questions you have with your health care provider.  High-Fiber Diet Fiber is found in fruits, vegetables, and grains. A high-fiber diet encourages the addition of more whole grains, legumes, fruits, and vegetables in your diet. The recommended amount of fiber for adult males is 38 g per day. For adult females, it is 25 g per day. Pregnant and lactating women should get 28 g of fiber per day. If you have a digestive or bowel problem, ask your caregiver for advice before adding high-fiber foods to your diet. Eat a variety of high-fiber foods instead of only a select few type of foods.  PURPOSE  To increase stool bulk.  To make bowel movements more regular to prevent constipation.  To lower cholesterol.  To prevent overeating. WHEN IS THIS DIET USED?  It may be used if you have constipation and hemorrhoids.  It may be used if you have uncomplicated diverticulosis (intestine condition) and irritable bowel syndrome.  It may be used if you need help with weight management.  It may be used if you want to add it to your diet as a protective measure against atherosclerosis, diabetes, and cancer. SOURCES OF FIBER  Whole-grain breads and cereals.  Fruits, such as apples, oranges, bananas, berries, prunes, and pears.  Vegetables, such as green peas, carrots, sweet potatoes, beets, broccoli, cabbage, spinach, and  artichokes.  Legumes, such split peas, soy, lentils.  Almonds. FIBER CONTENT IN FOODS Starches and Grains / Dietary Fiber (g)  Cheerios, 1 cup / 3 g  Corn Flakes cereal, 1 cup / 0.7 g  Rice crispy treat cereal, 1 cup / 0.3 g  Instant oatmeal (cooked),  cup / 2 g  Frosted wheat cereal, 1  cup / 5.1 g  Brown, long-grain rice (cooked), 1 cup / 3.5 g  White, long-grain rice (cooked), 1 cup / 0.6 g  Enriched macaroni (cooked), 1 cup / 2.5 g Legumes / Dietary Fiber (g)  Baked beans (canned, plain, or vegetarian),  cup / 5.2 g  Kidney beans (canned),  cup / 6.8 g  Pinto beans (cooked),  cup / 5.5 g Breads and Crackers / Dietary Fiber (g)  Plain or honey graham crackers, 2 squares / 0.7 g  Saltine crackers, 3 squares / 0.3 g  Plain, salted pretzels, 10 pieces / 1.8 g  Whole-wheat bread, 1 slice / 1.9 g  White bread, 1 slice / 0.7 g  Raisin bread, 1 slice / 1.2 g  Plain bagel, 3 oz / 2 g  Flour tortilla, 1 oz / 0.9 g  Corn tortilla, 1 small / 1.5 g  Hamburger or hotdog bun, 1 small / 0.9 g Fruits / Dietary Fiber (g)  Apple with skin, 1 medium / 4.4 g  Sweetened applesauce,  cup / 1.5 g  Banana,  medium / 1.5 g  Grapes, 10 grapes / 0.4 g  Orange, 1 small / 2.3 g  Raisin, 1.5 oz / 1.6 g  Melon, 1 cup / 1.4 g Vegetables / Dietary Fiber (g)  Green beans (canned),  cup / 1.3 g  Carrots (cooked),  cup / 2.3 g  Broccoli (cooked),  cup / 2.8 g  Peas (cooked),  cup / 4.4 g  Mashed potatoes,  cup / 1.6 g  Lettuce, 1 cup / 0.5 g  Corn (canned),  cup / 1.6 g  Tomato,  cup / 1.1 g Document Released: 04/05/2005 Document Revised: 10/05/2011 Document Reviewed: 07/08/2011 ExitCare Patient Information 2015 [place], [place]. This information is not intended to replace advice given to you by your health care provider. Make sure you discuss any questions you have with your health care provider.

## 2014-08-21 NOTE — Progress Notes (Signed)
Pt being discharged today. IR team will arrange and contact patient for follow up plans and appointment in drain clinic.  Brayton ElKevin Irvan Tiedt PA-C Interventional Radiology 08/21/2014 10:26 AM

## 2014-08-21 NOTE — Progress Notes (Signed)
Subjective: Pt feels better.  Tolerating a solid diet.  No pain, except from drain.  Objective: Vital signs in last 24 hours: Temp:  [98.3 F (36.8 C)-98.9 F (37.2 C)] 98.3 F (36.8 C) (05/04 0558) Pulse Rate:  [47-59] 47 (05/04 0558) Resp:  [16-18] 16 (05/04 0558) BP: (139-142)/(75-78) 142/75 mmHg (05/04 0558) SpO2:  [96 %-98 %] 96 % (05/04 0558) Weight:  [103.828 kg (228 lb 14.4 oz)] 103.828 kg (228 lb 14.4 oz) (05/04 0558) Last BM Date: 08/20/14  Intake/Output from previous day: 05/03 0701 - 05/04 0700 In: 1841.7 [P.O.:240; I.V.:1096.7; IV Piggyback:500] Out: 14 [Drains:14] Intake/Output this shift: Total I/O In: 200 [P.O.:200] Out: -   PE: Abd: soft, NT, ND, +BS, JP drain with essentially no output  Heart: regular Lungs: CTAB  Lab Results:   Recent Labs  08/19/14 0540 08/21/14 0547  WBC 7.6 7.1  HGB 11.9* 12.3*  HCT 35.5* 36.2*  PLT 310 290   BMET No results for input(s): NA, K, CL, CO2, GLUCOSE, BUN, CREATININE, CALCIUM in the last 72 hours. PT/INR No results for input(s): LABPROT, INR in the last 72 hours. CMP     Component Value Date/Time   NA 139 08/17/2014 0616   K 4.3 08/17/2014 0616   CL 107 08/17/2014 0616   CO2 25 08/17/2014 0616   GLUCOSE 98 08/17/2014 0616   BUN 9 08/17/2014 0616   CREATININE 1.21 08/17/2014 0616   CALCIUM 8.2* 08/17/2014 0616   PROT 6.0 08/17/2014 0616   ALBUMIN 2.8* 08/17/2014 0616   AST 20 08/17/2014 0616   ALT 20 08/17/2014 0616   ALKPHOS 38* 08/17/2014 0616   BILITOT 0.4 08/17/2014 0616   GFRNONAA 66* 08/17/2014 0616   GFRAA 77* 08/17/2014 0616   Lipase     Component Value Date/Time   LIPASE 33 08/16/2014 1926       Studies/Results: No results found.  Anti-infectives: Anti-infectives    Start     Dose/Rate Route Frequency Ordered Stop   08/21/14 1400  metroNIDAZOLE (FLAGYL) tablet 500 mg     500 mg Oral 3 times per day 08/21/14 0819     08/21/14 0830  ciprofloxacin (CIPRO) tablet 500 mg     500 mg Oral 2 times daily 08/21/14 0819     08/17/14 2300  metroNIDAZOLE (FLAGYL) IVPB 500 mg  Status:  Discontinued     500 mg 100 mL/hr over 60 Minutes Intravenous 3 times per day 08/16/14 2236 08/16/14 2236   08/17/14 0930  ciprofloxacin (CIPRO) IVPB 400 mg  Status:  Discontinued     400 mg 200 mL/hr over 60 Minutes Intravenous Every 12 hours 08/16/14 2234 08/21/14 0818   08/16/14 2300  metroNIDAZOLE (FLAGYL) IVPB 500 mg  Status:  Discontinued     500 mg 100 mL/hr over 60 Minutes Intravenous 3 times per day 08/16/14 2236 08/21/14 0818   08/16/14 2230  metroNIDAZOLE (FLAGYL) IVPB 500 mg  Status:  Discontinued     500 mg 100 mL/hr over 60 Minutes Intravenous Every 8 hours 08/16/14 2227 08/16/14 2236   08/16/14 2015  ciprofloxacin (CIPRO) IVPB 400 mg     400 mg 200 mL/hr over 60 Minutes Intravenous  Once 08/16/14 2012 08/16/14 2229   08/16/14 2015  metroNIDAZOLE (FLAGYL) IVPB 500 mg  Status:  Discontinued     500 mg 100 mL/hr over 60 Minutes Intravenous  Once 08/16/14 2012 08/19/14 1022       Assessment/Plan HD#5 diverticulitis with macroscopic perforation S/p IR drain placement 5/1 -  tolerate soft diet -cipro/flagyl D#5/14 dispo- home today with follow up with IR (I have contacted them), Dr. Derrell Lollingamirez in 2-3 weeks, and Dr. Bosie ClosSChooler, per GI note.  I have explained to him and signs of symptoms of a worsening condition that he would need to call our office for.  He understands.  LOS: 5 days    Jonathan Graves 08/21/2014, 10:15 AM Pager: [phone]

## 2014-08-22 LAB — CULTURE, ROUTINE-ABSCESS: CULTURE: NO GROWTH

## 2014-08-23 ENCOUNTER — Other Ambulatory Visit (HOSPITAL_COMMUNITY): Payer: Self-pay | Admitting: Interventional Radiology

## 2014-08-23 DIAGNOSIS — L0291 Cutaneous abscess, unspecified: Secondary | ICD-10-CM

## 2014-08-23 NOTE — Progress Notes (Signed)
[facility] at 15:18 08/21/14. [facility] 10:49 08[facility]23/14 [facility]

## 2014-08-27 ENCOUNTER — Ambulatory Visit (HOSPITAL_COMMUNITY): Admission: RE | Admit: 2014-08-27 | Payer: BLUE CROSS/BLUE SHIELD | Source: Ambulatory Visit

## 2014-08-27 ENCOUNTER — Ambulatory Visit (HOSPITAL_COMMUNITY)
Admission: RE | Admit: 2014-08-27 | Discharge: 2014-08-27 | Disposition: A | Discharge status: Home / Self Care | Payer: BLUE CROSS/BLUE SHIELD | Source: Ambulatory Visit | Attending: Interventional Radiology | Admitting: Interventional Radiology

## 2014-08-27 DIAGNOSIS — R229 Localized swelling, mass and lump, unspecified: Secondary | ICD-10-CM | POA: Insufficient documentation

## 2014-08-27 DIAGNOSIS — Z4889 Encounter for other specified surgical aftercare: Secondary | ICD-10-CM | POA: Insufficient documentation

## 2014-08-27 DIAGNOSIS — L0291 Cutaneous abscess, unspecified: Secondary | ICD-10-CM

## 2014-08-27 DIAGNOSIS — N200 Calculus of kidney: Secondary | ICD-10-CM | POA: Diagnosis not present

## 2014-08-27 DIAGNOSIS — K5780 Diverticulitis of intestine, part unspecified, with perforation and abscess without bleeding: Secondary | ICD-10-CM | POA: Insufficient documentation

## 2014-08-27 DIAGNOSIS — I700 Atherosclerosis of aorta: Secondary | ICD-10-CM | POA: Insufficient documentation

## 2014-09-03 ENCOUNTER — Other Ambulatory Visit (HOSPITAL_COMMUNITY): Payer: Self-pay | Admitting: Interventional Radiology

## 2014-09-03 DIAGNOSIS — L0291 Cutaneous abscess, unspecified: Secondary | ICD-10-CM

## 2014-09-10 ENCOUNTER — Other Ambulatory Visit (HOSPITAL_COMMUNITY): Payer: Self-pay | Admitting: Interventional Radiology

## 2014-09-10 ENCOUNTER — Ambulatory Visit (HOSPITAL_COMMUNITY)
Admission: RE | Admit: 2014-09-10 | Discharge: 2014-09-10 | Disposition: A | Discharge status: Home / Self Care | Payer: BLUE CROSS/BLUE SHIELD | Source: Ambulatory Visit | Attending: Interventional Radiology | Admitting: Interventional Radiology

## 2014-09-10 DIAGNOSIS — L0291 Cutaneous abscess, unspecified: Secondary | ICD-10-CM

## 2014-09-10 DIAGNOSIS — K651 Peritoneal abscess: Secondary | ICD-10-CM | POA: Insufficient documentation

## 2014-09-10 DIAGNOSIS — Z4889 Encounter for other specified surgical aftercare: Secondary | ICD-10-CM | POA: Insufficient documentation

## 2014-09-10 MED ORDER — IOHEXOL 300 MG/ML  SOLN
50.0000 mL | Freq: Once | INTRAMUSCULAR | Status: AC | PRN
  Administered 2014-09-10: 6 mL via INTRAVENOUS

## 2014-09-24 ENCOUNTER — Ambulatory Visit (HOSPITAL_COMMUNITY)
Admission: RE | Admit: 2014-09-24 | Discharge: 2014-09-24 | Disposition: A | Discharge status: Home / Self Care | Payer: BLUE CROSS/BLUE SHIELD | Source: Ambulatory Visit | Attending: Interventional Radiology | Admitting: Interventional Radiology

## 2014-09-24 DIAGNOSIS — K5780 Diverticulitis of intestine, part unspecified, with perforation and abscess without bleeding: Secondary | ICD-10-CM | POA: Diagnosis not present

## 2014-09-24 DIAGNOSIS — Z4889 Encounter for other specified surgical aftercare: Secondary | ICD-10-CM | POA: Insufficient documentation

## 2014-09-24 DIAGNOSIS — Z4803 Encounter for change or removal of drains: Secondary | ICD-10-CM | POA: Insufficient documentation

## 2014-09-24 DIAGNOSIS — L0291 Cutaneous abscess, unspecified: Secondary | ICD-10-CM

## 2014-09-24 MED ORDER — IOHEXOL 300 MG/ML  SOLN
50.0000 mL | Freq: Once | INTRAMUSCULAR | Status: AC | PRN
  Administered 2014-09-24: 6 mL via INTRAVENOUS

## 2014-12-19 ENCOUNTER — Ambulatory Visit: Payer: Self-pay | Admitting: General Surgery

## 2014-12-19 NOTE — H&P (Signed)
History of Present Illness Jonathan Filler MD; 12/19/2014 9:58 AM) Patient words: reck.  The patient is a 54 year old male who presents with diverticulitis. 54 year old male who previously has had approximately 3 episodes of diverticulitis with the most recent being perforated diverticulitis requiring drain placement. Since patient's last visit in May the patient has had his drain removed. Patient's last colonoscopy was in October 2015. This revealed diverticuli. There is no masses seen.   Allergies (Sonya Bynum, CMA; 12/19/2014 9:40 AM) No Known Drug Allergies05/19/2016  Medication History (Sonya Bynum, CMA; 12/19/2014 9:41 AM) Lisinopril-Hydrochlorothiazide (20-12.5MG  Tablet, Oral) Active. Meloxicam (  Tablet, Oral) Active. Aspirin (  Tablet Chewable, Oral) Active. Vitamin B12 ( Tablet, Oral) Active. Fiber (  Tablet, Oral) Active. Medications Reconciled  Vitals (Sonya Bynum CMA; 12/19/2014 9:40 AM) 12/19/2014 9:39 AM Weight: 234 lb Height: 73in Body Surface Area: 2.34 m Body Mass Index: 30.87 kg/m Temp.: 80F(Temporal)  Pulse: 81 (Regular)  BP: 132/70 (Sitting, Left Arm, Standard)    Physical Exam Jonathan Filler MD; 12/19/2014 10:00 AM) General Mental Status-Alert. General Appearance-Consistent with stated age. Hydration-Well hydrated. Voice-Normal.  Chest and Lung Exam Chest and lung exam reveals -quiet, even and easy respiratory effort with no use of accessory muscles and on auscultation, normal breath sounds, no adventitious sounds and normal vocal resonance. Inspection Chest Wall - Normal. Back - normal.  Cardiovascular Cardiovascular examination reveals -normal heart sounds, regular rate and rhythm with no murmurs and normal pedal pulses bilaterally.  Abdomen Inspection Inspection of the abdomen reveals - No Hernias. Skin - Scar - no surgical scars. Palpation/Percussion Palpation and Percussion of the abdomen reveal -  Soft, Non Tender, No Rebound tenderness, No Rigidity (guarding) and No hepatosplenomegaly. Auscultation Auscultation of the abdomen reveals - Bowel sounds normal.    Assessment & Plan Jonathan Filler MD; 12/19/2014 10:03 AM) DIVERTICULITIS OF LARGE INTESTINE WITH PERFORATION WITHOUT BLEEDING (562.11  K57.20) Impression: 53 year old male with a third episode of diverticulitis, perforated requiring drain placement. Patient has been doing well since his hospitalization in April. He's had his drain removed. We did discuss the likelihood of increasing recurrences of the diverticulitis. The patient is ready to proceed with sigmoidectomy.  1. We'll schedule the patient for a robotic sigmoidectomy. 2. We discussed this and benefits of the procedure to include but not limited to: Infection, bleeding, possible leakage of anastomosis possible requirement for drainage placement, possible ostomy. The patient was understanding and wishes to proceed.

## 2015-01-18 HISTORY — PX: SMALL INTESTINE SURGERY: SHX150

## 2015-01-21 NOTE — Patient Instructions (Addendum)
19 Jonathan Graves  01/21/2015   Your procedure is scheduled on:  10-14  -2016 Friday  Enter through [facility]  Entrance and follow signs to [facility]. Arrive at       0730 AM.  (Limit 1 person with you).  Call this number if you have problems the morning of surgery: [phone]  Or Presurgical Testing [phone].   For Living Will and/or Health Care Power Attorney Forms: please provide copy for your medical record,may bring AM of surgery(Forms should be already notarized -we do not provide this service).(01-22-15 Yes, will provide information on day surgery).  Remember: Follow any bowel prep instructions per MD office.   Do not eat food/ or drink: After Midnight.      Take these medicines the morning of surgery with A SIP OF WATER-   (DO NOT TAKE ANY DIABETIC MEDS AM OF SURGERY) : NONE.   Do not wear jewelry, make-up or nail polish.  Do not wear deodorant, lotions, powders, or perfumes.   Do not shave legs and under arms- 48 hours(2 days) prior to first CHG shower.(Shaving face and neck okay.)  Do not bring valuables to the hospital.(Hospital is not responsible for lost valuables).  Contacts, dentures or removable bridgework, body piercing, hair pins may not be worn into surgery.  Leave suitcase in the car. After surgery it may be brought to your room.  For patients admitted to the hospital, checkout time is 11:00 AM the day of discharge.(Restricted visitors-Any Persons displaying flu-like symptoms or illness).    Patients discharged the day of surgery will not be allowed to drive home. Must have responsible person with you x 24 hours once discharged.  Name and phone number of your driver: Jonathan Graves -spouse - [phone] cell     Please read over the following fact sheets that you were given:  CHG(Chlorhexidine Gluconate 4% Surgical Soap) use.  Remember : Type/Screen "Blue armbands" - may not be removed once applied(would result in being retested AM of surgery, if  removed).         [facility] - Preparing for Surgery Before surgery, you can play an important role.  Because skin is not sterile, your skin needs to be as free of germs as possible.  You can reduce the number of germs on your skin by washing with CHG (chlorahexidine gluconate) soap before surgery.  CHG is an antiseptic cleaner which kills germs and bonds with the skin to continue killing germs even after washing. Please DO NOT use if you have an allergy to CHG or antibacterial soaps.  If your skin becomes reddened/irritated stop using the CHG and inform your nurse when you arrive at Short Stay. Do not shave (including legs and underarms) for at least 48 hours prior to the first CHG shower.  You may shave your face/neck. Please follow these instructions carefully:  1.  Shower with CHG Soap the night before surgery and the  morning of Surgery.  2.  If you choose to wash your hair, wash your hair first as usual with your  normal  shampoo.  3.  After you shampoo, rinse your hair and body thoroughly to remove the  shampoo.                           4.  Use CHG as you would any other liquid soap.  You can apply chg directly  to the skin and wash  Gently with a scrungie or clean washcloth.  5.  Apply the CHG Soap to your body ONLY FROM THE NECK DOWN.   Do not use on face/ open                           Wound or open sores. Avoid contact with eyes, ears mouth and genitals (private parts).                       Wash face,  Genitals (private parts) with your normal soap.             6.  Wash thoroughly, paying special attention to the area where your surgery  will be performed.  7.  Thoroughly rinse your body with warm water from the neck down.  8.  DO NOT shower/wash with your normal soap after using and rinsing off  the CHG Soap.                9.  Pat yourself dry with a clean towel.            10.  Wear clean pajamas.            11.  Place clean sheets on your bed the night of  your first shower and do not  sleep with pets. Day of Surgery : Do not apply any lotions/deodorants the morning of surgery.  Please wear clean clothes to the hospital/surgery center.  FAILURE TO FOLLOW THESE INSTRUCTIONS MAY RESULT IN THE CANCELLATION OF YOUR SURGERY PATIENT SIGNATURE_________________________________  NURSE SIGNATURE__________________________________  ________________________________________________________________________

## 2015-01-22 ENCOUNTER — Encounter (HOSPITAL_COMMUNITY)
Admission: RE | Admit: 2015-01-22 | Discharge: 2015-01-22 | Disposition: A | Discharge status: Home / Self Care | Payer: BLUE CROSS/BLUE SHIELD | Source: Ambulatory Visit | Attending: General Surgery | Admitting: General Surgery

## 2015-01-22 ENCOUNTER — Encounter (HOSPITAL_COMMUNITY): Payer: Self-pay

## 2015-01-22 DIAGNOSIS — Z01818 Encounter for other preprocedural examination: Secondary | ICD-10-CM | POA: Insufficient documentation

## 2015-01-22 LAB — BASIC METABOLIC PANEL
Anion gap: 5 (ref 5–15)
BUN: 17 mg/dL (ref 6–20)
CALCIUM: 9.3 mg/dL (ref 8.9–10.3)
CO2: 28 mmol/L (ref 22–32)
CREATININE: 1.11 mg/dL (ref 0.61–1.24)
Chloride: 104 mmol/L (ref 101–111)
GFR calc non Af Amer: >60 mL/min (ref >60)
Glucose, Bld: 112 mg/dL — ABNORMAL HIGH (ref 65–99)
Potassium: 4.7 mmol/L (ref 3.5–5.1)
SODIUM: 137 mmol/L (ref 135–145)

## 2015-01-22 LAB — CBC
HEMATOCRIT: 40.4 % (ref 39.0–52.0)
Hemoglobin: 13.8 g/dL (ref 13.0–17.0)
MCH: 34.3 pg — ABNORMAL HIGH (ref 26.0–34.0)
MCHC: 34.2 g/dL (ref 30.0–36.0)
MCV: 100.5 fL — ABNORMAL HIGH (ref 78.0–100.0)
Platelets: 178 10*3/uL (ref 150–400)
RBC: 4.02 MIL/uL — ABNORMAL LOW (ref 4.22–5.81)
RDW: 13.7 % (ref 11.5–15.5)
WBC: 6.8 10*3/uL (ref 4.0–10.5)

## 2015-01-22 LAB — ABO/RH: ABO/RH(D): A POS

## 2015-01-22 NOTE — Pre-Procedure Instructions (Signed)
EKG 04-13-14 Epic. CT abd/pelvis 09-12-14 Epic.

## 2015-01-31 ENCOUNTER — Inpatient Hospital Stay (HOSPITAL_COMMUNITY): Payer: BLUE CROSS/BLUE SHIELD | Admitting: Anesthesiology

## 2015-01-31 ENCOUNTER — Inpatient Hospital Stay (HOSPITAL_COMMUNITY)
Admission: RE | Admit: 2015-01-31 | Discharge: 2015-02-07 | DRG: 330 | Disposition: A | Discharge status: Home / Self Care | Payer: BLUE CROSS/BLUE SHIELD | Source: Ambulatory Visit | Attending: General Surgery | Admitting: General Surgery

## 2015-01-31 ENCOUNTER — Encounter (HOSPITAL_COMMUNITY): Payer: Self-pay | Admitting: *Deleted

## 2015-01-31 ENCOUNTER — Encounter (HOSPITAL_COMMUNITY)
Admission: RE | Disposition: A | Discharge status: Home / Self Care | Payer: Self-pay | Source: Ambulatory Visit | Attending: General Surgery

## 2015-01-31 DIAGNOSIS — Z9049 Acquired absence of other specified parts of digestive tract: Secondary | ICD-10-CM

## 2015-01-31 DIAGNOSIS — F17210 Nicotine dependence, cigarettes, uncomplicated: Secondary | ICD-10-CM | POA: Diagnosis present

## 2015-01-31 DIAGNOSIS — I10 Essential (primary) hypertension: Secondary | ICD-10-CM | POA: Diagnosis present

## 2015-01-31 DIAGNOSIS — K913 Postprocedural intestinal obstruction: Secondary | ICD-10-CM | POA: Diagnosis present

## 2015-01-31 DIAGNOSIS — K5792 Diverticulitis of intestine, part unspecified, without perforation or abscess without bleeding: Principal | ICD-10-CM | POA: Diagnosis present

## 2015-01-31 DIAGNOSIS — K567 Ileus, unspecified: Secondary | ICD-10-CM

## 2015-01-31 DIAGNOSIS — K9189 Other postprocedural complications and disorders of digestive system: Secondary | ICD-10-CM

## 2015-01-31 HISTORY — PX: LAPAROSCOPIC SIGMOID COLECTOMY: SHX5928

## 2015-01-31 LAB — TYPE AND SCREEN
ABO/RH(D): A POS
Antibody Screen: NEGATIVE

## 2015-01-31 LAB — CBC
HCT: 39.9 % (ref 39.0–52.0)
HEMOGLOBIN: 13.8 g/dL (ref 13.0–17.0)
MCH: 34.7 pg — AB (ref 26.0–34.0)
MCHC: 34.6 g/dL (ref 30.0–36.0)
MCV: 100.3 fL — AB (ref 78.0–100.0)
Platelets: 189 10*3/uL (ref 150–400)
RBC: 3.98 MIL/uL — AB (ref 4.22–5.81)
RDW: 13.5 % (ref 11.5–15.5)
WBC: 12.4 10*3/uL — ABNORMAL HIGH (ref 4.0–10.5)

## 2015-01-31 SURGERY — COLECTOMY, SIGMOID, LAPAROSCOPIC
Anesthesia: General

## 2015-01-31 MED ORDER — ALVIMOPAN 12 MG PO CAPS
12.0000 mg | ORAL_CAPSULE | Freq: Once | ORAL | Status: AC
  Administered 2015-01-31: 12 mg via ORAL
  Filled 2015-01-31: qty 1

## 2015-01-31 MED ORDER — SACCHAROMYCES BOULARDII 250 MG PO CAPS
250.0000 mg | ORAL_CAPSULE | Freq: Two times a day (BID) | ORAL | Status: DC
  Administered 2015-01-31 – 2015-02-06 (×13): 250 mg via ORAL
  Filled 2015-01-31 (×15): qty 1

## 2015-01-31 MED ORDER — ONDANSETRON HCL 4 MG PO TABS: 4.0000 mg | ORAL_TABLET | Freq: Four times a day (QID) | ORAL | Status: DC | PRN

## 2015-01-31 MED ORDER — ENOXAPARIN SODIUM 40 MG/0.4ML ~~LOC~~ SOLN
40.0000 mg | SUBCUTANEOUS | Status: DC
  Administered 2015-02-01 – 2015-02-07 (×7): 40 mg via SUBCUTANEOUS
  Filled 2015-01-31 (×8): qty 0.4

## 2015-01-31 MED ORDER — SODIUM CHLORIDE 0.9 % IJ SOLN
INTRAMUSCULAR | Status: AC
  Filled 2015-01-31: qty 10

## 2015-01-31 MED ORDER — SUGAMMADEX SODIUM 500 MG/5ML IV SOLN
INTRAVENOUS | Status: DC | PRN
  Administered 2015-01-31: 500 mg via INTRAVENOUS

## 2015-01-31 MED ORDER — SODIUM CHLORIDE 0.9 % IJ SOLN: 9.0000 mL | INTRAMUSCULAR | Status: DC | PRN

## 2015-01-31 MED ORDER — PROPOFOL 10 MG/ML IV BOLUS
INTRAVENOUS | Status: AC
  Filled 2015-01-31: qty 20

## 2015-01-31 MED ORDER — INFLUENZA VAC SPLIT QUAD 0.5 ML IM SUSY
0.5000 mL | PREFILLED_SYRINGE | INTRAMUSCULAR | Status: DC
  Filled 2015-01-31 (×2): qty 0.5

## 2015-01-31 MED ORDER — ACETAMINOPHEN 10 MG/ML IV SOLN
INTRAVENOUS | Status: AC
  Filled 2015-01-31: qty 100

## 2015-01-31 MED ORDER — DIPHENHYDRAMINE HCL 25 MG PO CAPS: 25.0000 mg | ORAL_CAPSULE | Freq: Four times a day (QID) | ORAL | Status: DC | PRN

## 2015-01-31 MED ORDER — 0.9 % SODIUM CHLORIDE (POUR BTL) OPTIME
TOPICAL | Status: DC | PRN
  Administered 2015-01-31: 3000 mL

## 2015-01-31 MED ORDER — HYDROMORPHONE HCL 1 MG/ML IJ SOLN
INTRAMUSCULAR | Status: AC
  Filled 2015-01-31: qty 1

## 2015-01-31 MED ORDER — ACETAMINOPHEN 10 MG/ML IV SOLN
1000.0000 mg | Freq: Once | INTRAVENOUS | Status: AC
  Administered 2015-01-31: 1000 mg via INTRAVENOUS

## 2015-01-31 MED ORDER — FENTANYL CITRATE (PF) 100 MCG/2ML IJ SOLN
25.0000 ug | INTRAMUSCULAR | Status: DC | PRN
  Administered 2015-01-31 (×2): 25 ug via INTRAVENOUS

## 2015-01-31 MED ORDER — HYDROMORPHONE 0.3 MG/ML IV SOLN
INTRAVENOUS | Status: DC
  Administered 2015-01-31: 0.3 mg via INTRAVENOUS
  Administered 2015-01-31: 2.1 mg via INTRAVENOUS
  Administered 2015-01-31: 1.2 mg via INTRAVENOUS
  Administered 2015-02-01: 02:00:00 via INTRAVENOUS
  Administered 2015-02-01: 0.9 mg via INTRAVENOUS
  Administered 2015-02-01: 19:00:00 via INTRAVENOUS
  Administered 2015-02-01: 1.8 mg via INTRAVENOUS
  Administered 2015-02-01: 0.9 mg via INTRAVENOUS
  Administered 2015-02-01: 2.1 mg via INTRAVENOUS
  Administered 2015-02-02 (×2): 0.9 mg via INTRAVENOUS
  Filled 2015-01-31 (×2): qty 25

## 2015-01-31 MED ORDER — FENTANYL CITRATE (PF) 250 MCG/5ML IJ SOLN
INTRAMUSCULAR | Status: AC
  Filled 2015-01-31: qty 25

## 2015-01-31 MED ORDER — FENTANYL CITRATE (PF) 100 MCG/2ML IJ SOLN
INTRAMUSCULAR | Status: AC
  Filled 2015-01-31: qty 2

## 2015-01-31 MED ORDER — DEXAMETHASONE SODIUM PHOSPHATE 10 MG/ML IJ SOLN
INTRAMUSCULAR | Status: DC | PRN
  Administered 2015-01-31: 10 mg via INTRAVENOUS

## 2015-01-31 MED ORDER — ROCURONIUM BROMIDE 100 MG/10ML IV SOLN
INTRAVENOUS | Status: AC
  Filled 2015-01-31: qty 1

## 2015-01-31 MED ORDER — ROCURONIUM BROMIDE 100 MG/10ML IV SOLN
INTRAVENOUS | Status: DC | PRN
  Administered 2015-01-31: 10 mg via INTRAVENOUS
  Administered 2015-01-31: 50 mg via INTRAVENOUS
  Administered 2015-01-31 (×2): 10 mg via INTRAVENOUS

## 2015-01-31 MED ORDER — DIPHENHYDRAMINE HCL 50 MG/ML IJ SOLN: 12.5000 mg | Freq: Four times a day (QID) | INTRAMUSCULAR | Status: DC | PRN

## 2015-01-31 MED ORDER — HYDROMORPHONE HCL 1 MG/ML IJ SOLN
0.2500 mg | INTRAMUSCULAR | Status: DC | PRN
  Administered 2015-01-31 (×4): 0.5 mg via INTRAVENOUS

## 2015-01-31 MED ORDER — DIPHENHYDRAMINE HCL 50 MG/ML IJ SOLN: 25.0000 mg | Freq: Four times a day (QID) | INTRAMUSCULAR | Status: DC | PRN

## 2015-01-31 MED ORDER — DEXTROSE 5 % IV SOLN
2.0000 g | INTRAVENOUS | Status: AC
  Administered 2015-01-31: 2 g via INTRAVENOUS
  Filled 2015-01-31: qty 2

## 2015-01-31 MED ORDER — DEXTROSE-NACL 5-0.9 % IV SOLN
INTRAVENOUS | Status: DC
  Administered 2015-01-31 – 2015-02-03 (×6): via INTRAVENOUS

## 2015-01-31 MED ORDER — SUCCINYLCHOLINE CHLORIDE 20 MG/ML IJ SOLN
INTRAMUSCULAR | Status: DC | PRN
  Administered 2015-01-31: 140 mg via INTRAVENOUS

## 2015-01-31 MED ORDER — LACTATED RINGERS IV SOLN
INTRAVENOUS | Status: DC | PRN
  Administered 2015-01-31: 11:00:00 via INTRAVENOUS

## 2015-01-31 MED ORDER — SUGAMMADEX SODIUM 500 MG/5ML IV SOLN
INTRAVENOUS | Status: AC
Start: 2015-01-31 — End: 2015-01-31
  Filled 2015-01-31: qty 5

## 2015-01-31 MED ORDER — ONDANSETRON HCL 4 MG/2ML IJ SOLN: 4.0000 mg | Freq: Four times a day (QID) | INTRAMUSCULAR | Status: DC | PRN

## 2015-01-31 MED ORDER — ALVIMOPAN 12 MG PO CAPS
12.0000 mg | ORAL_CAPSULE | Freq: Two times a day (BID) | ORAL | Status: DC
  Administered 2015-02-01 – 2015-02-05 (×10): 12 mg via ORAL
  Filled 2015-01-31 (×12): qty 1

## 2015-01-31 MED ORDER — LACTATED RINGERS IR SOLN
Status: DC | PRN
  Administered 2015-01-31: 1

## 2015-01-31 MED ORDER — BUPIVACAINE-EPINEPHRINE (PF) 0.25% -1:200000 IJ SOLN
INTRAMUSCULAR | Status: AC
  Filled 2015-01-31: qty 30

## 2015-01-31 MED ORDER — LIDOCAINE HCL (PF) 2 % IJ SOLN
INTRAMUSCULAR | Status: DC | PRN
  Administered 2015-01-31: 40 mg via INTRADERMAL

## 2015-01-31 MED ORDER — ONDANSETRON HCL 4 MG/2ML IJ SOLN
INTRAMUSCULAR | Status: DC | PRN
  Administered 2015-01-31: 4 mg via INTRAVENOUS

## 2015-01-31 MED ORDER — BUPIVACAINE-EPINEPHRINE 0.25% -1:200000 IJ SOLN
INTRAMUSCULAR | Status: DC | PRN
  Administered 2015-01-31: 25 mL

## 2015-01-31 MED ORDER — CETYLPYRIDINIUM CHLORIDE 0.05 % MT LIQD
7.0000 mL | Freq: Two times a day (BID) | OROMUCOSAL | Status: DC
  Administered 2015-02-02 – 2015-02-05 (×7): 7 mL via OROMUCOSAL

## 2015-01-31 MED ORDER — PROPOFOL 10 MG/ML IV BOLUS
INTRAVENOUS | Status: DC | PRN
  Administered 2015-01-31: 200 mg via INTRAVENOUS

## 2015-01-31 MED ORDER — DEXAMETHASONE SODIUM PHOSPHATE 10 MG/ML IJ SOLN
INTRAMUSCULAR | Status: AC
  Filled 2015-01-31: qty 1

## 2015-01-31 MED ORDER — HYDROMORPHONE 0.3 MG/ML IV SOLN
INTRAVENOUS | Status: AC
  Filled 2015-01-31: qty 25

## 2015-01-31 MED ORDER — FENTANYL CITRATE (PF) 100 MCG/2ML IJ SOLN
INTRAMUSCULAR | Status: DC | PRN
  Administered 2015-01-31 (×2): 50 ug via INTRAVENOUS
  Administered 2015-01-31: 100 ug via INTRAVENOUS
  Administered 2015-01-31 (×3): 50 ug via INTRAVENOUS

## 2015-01-31 MED ORDER — EPHEDRINE SULFATE 50 MG/ML IJ SOLN
INTRAMUSCULAR | Status: AC
  Filled 2015-01-31: qty 1

## 2015-01-31 MED ORDER — ONDANSETRON HCL 4 MG/2ML IJ SOLN
INTRAMUSCULAR | Status: AC
  Filled 2015-01-31: qty 2

## 2015-01-31 MED ORDER — EPHEDRINE SULFATE 50 MG/ML IJ SOLN
INTRAMUSCULAR | Status: DC | PRN
  Administered 2015-01-31 (×2): 10 mg via INTRAVENOUS

## 2015-01-31 MED ORDER — CEFOTETAN DISODIUM-DEXTROSE 2-2.08 GM-% IV SOLR
INTRAVENOUS | Status: AC
  Filled 2015-01-31: qty 50

## 2015-01-31 MED ORDER — DIPHENHYDRAMINE HCL 12.5 MG/5ML PO ELIX: 12.5000 mg | ORAL_SOLUTION | Freq: Four times a day (QID) | ORAL | Status: DC | PRN

## 2015-01-31 MED ORDER — HYDROMORPHONE HCL 1 MG/ML IJ SOLN: 1.0000 mg | INTRAMUSCULAR | Status: DC | PRN

## 2015-01-31 MED ORDER — NALOXONE HCL 0.4 MG/ML IJ SOLN: 0.4000 mg | INTRAMUSCULAR | Status: DC | PRN

## 2015-01-31 MED ORDER — HYDROMORPHONE HCL 2 MG/ML IJ SOLN
INTRAMUSCULAR | Status: AC
  Filled 2015-01-31: qty 1

## 2015-01-31 MED ORDER — MIDAZOLAM HCL 2 MG/2ML IJ SOLN
INTRAMUSCULAR | Status: AC
  Filled 2015-01-31: qty 4

## 2015-01-31 MED ORDER — MIDAZOLAM HCL 5 MG/5ML IJ SOLN
INTRAMUSCULAR | Status: DC | PRN
  Administered 2015-01-31: 2 mg via INTRAVENOUS

## 2015-01-31 MED ORDER — LACTATED RINGERS IV SOLN
INTRAVENOUS | Status: AC
  Administered 2015-01-31: 12:00:00 via INTRAVENOUS
  Administered 2015-01-31: 1000 mL via INTRAVENOUS

## 2015-01-31 MED ORDER — CHLORHEXIDINE GLUCONATE 0.12 % MT SOLN
15.0000 mL | Freq: Two times a day (BID) | OROMUCOSAL | Status: DC
  Administered 2015-02-01 – 2015-02-06 (×9): 15 mL via OROMUCOSAL
  Filled 2015-01-31 (×14): qty 15

## 2015-01-31 MED ORDER — PROMETHAZINE HCL 25 MG/ML IJ SOLN: 6.2500 mg | INTRAMUSCULAR | Status: DC | PRN

## 2015-01-31 MED ORDER — DEXTROSE 5 % IV SOLN
2.0000 g | Freq: Two times a day (BID) | INTRAVENOUS | Status: AC
  Administered 2015-01-31: 2 g via INTRAVENOUS
  Filled 2015-01-31: qty 2

## 2015-01-31 MED ORDER — HYDROMORPHONE HCL 1 MG/ML IJ SOLN
INTRAMUSCULAR | Status: DC | PRN
  Administered 2015-01-31 (×3): 1 mg via INTRAVENOUS

## 2015-01-31 SURGICAL SUPPLY — 73 items
APPLIER CLIP 5 13 M/L LIGAMAX5 (MISCELLANEOUS)
APPLIER CLIP ROT 10 11.4 M/L (STAPLE)
BLADE EXTENDED COATED 6.5IN (ELECTRODE) IMPLANT
BLADE HEX COATED 2.75 (ELECTRODE) ×3 IMPLANT
CELLS DAT CNTRL 66122 CELL SVR (MISCELLANEOUS) IMPLANT
CLAMP ENDO BABCK 10MM (STAPLE) IMPLANT
CLIP APPLIE 5 13 M/L LIGAMAX5 (MISCELLANEOUS) IMPLANT
CLIP APPLIE ROT 10 11.4 M/L (STAPLE) IMPLANT
CLOSURE WOUND 1/2 X4 (GAUZE/BANDAGES/DRESSINGS)
CONNECTOR 5 IN 1 STRAIGHT STRL (MISCELLANEOUS) IMPLANT
COVER SURGICAL LIGHT HANDLE (MISCELLANEOUS) ×6 IMPLANT
DECANTER SPIKE VIAL GLASS SM (MISCELLANEOUS) ×3 IMPLANT
DEVICE TROCAR PUNCTURE CLOSURE (ENDOMECHANICALS) IMPLANT
DRAPE LAPAROSCOPIC ABDOMINAL (DRAPES) ×3 IMPLANT
DRSG OPSITE POSTOP 4X10 (GAUZE/BANDAGES/DRESSINGS) ×3 IMPLANT
DRSG TEGADERM 2-3/8X2-3/4 SM (GAUZE/BANDAGES/DRESSINGS) ×3 IMPLANT
ELECT BLADE TIP CTD 4 INCH (ELECTRODE) ×3 IMPLANT
ELECT REM PT RETURN 9FT ADLT (ELECTROSURGICAL) ×3
ELECTRODE REM PT RTRN 9FT ADLT (ELECTROSURGICAL) ×1 IMPLANT
ENSEAL DEVICE STD TIP 35CM (ENDOMECHANICALS) IMPLANT
GAUZE SPONGE 2X2 8PLY STRL LF (GAUZE/BANDAGES/DRESSINGS) ×1 IMPLANT
GAUZE SPONGE 4X4 12PLY STRL (GAUZE/BANDAGES/DRESSINGS) ×3 IMPLANT
GLOVE BIOGEL PI IND STRL 7.0 (GLOVE) ×1 IMPLANT
GLOVE BIOGEL PI INDICATOR 7.0 (GLOVE) ×2
GOWN STRL REUS W/TWL LRG LVL3 (GOWN DISPOSABLE) ×3 IMPLANT
GOWN STRL REUS W/TWL XL LVL3 (GOWN DISPOSABLE) ×6 IMPLANT
LEGGING LITHOTOMY PAIR STRL (DRAPES) ×3 IMPLANT
LIGASURE IMPACT 36 18CM CVD LR (INSTRUMENTS) ×3 IMPLANT
NS IRRIG 1000ML POUR BTL (IV SOLUTION) ×6 IMPLANT
PACK COLON (CUSTOM PROCEDURE TRAY) ×3 IMPLANT
RTRCTR WOUND ALEXIS 18CM MED (MISCELLANEOUS)
SCISSORS LAP 5X35 DISP (ENDOMECHANICALS) IMPLANT
SET IRRIG TUBING LAPAROSCOPIC (IRRIGATION / IRRIGATOR) ×3 IMPLANT
SHEARS HARMONIC ACE PLUS 36CM (ENDOMECHANICALS) ×3 IMPLANT
SLEEVE Z-THREAD 5X100MM (TROCAR) IMPLANT
SOLUTION ANTI FOG 6CC (MISCELLANEOUS) ×3 IMPLANT
SPONGE GAUZE 2X2 STER 10/PKG (GAUZE/BANDAGES/DRESSINGS) ×2
SPONGE LAP 18X18 X RAY DECT (DISPOSABLE) ×12 IMPLANT
STAPLER CUT CVD 40MM BLUE (STAPLE) ×3 IMPLANT
STAPLER PROXIMATE 75MM BLUE (STAPLE) ×3 IMPLANT
STAPLER VISISTAT 35W (STAPLE) ×3 IMPLANT
STRIP CLOSURE SKIN 1/2X4 (GAUZE/BANDAGES/DRESSINGS) IMPLANT
SUCTION POOLE TIP (SUCTIONS) ×3 IMPLANT
SUT PDS AB 1 CT1 27 (SUTURE) IMPLANT
SUT PDS AB 1 CTX 36 (SUTURE) ×6 IMPLANT
SUT PDS AB 4-0 SH 27 (SUTURE) IMPLANT
SUT PROLENE 2 0 KS (SUTURE) IMPLANT
SUT SILK 2 0 (SUTURE) ×2
SUT SILK 2 0 SH CR/8 (SUTURE) ×3 IMPLANT
SUT SILK 2-0 18XBRD TIE 12 (SUTURE) ×1 IMPLANT
SUT SILK 3 0 (SUTURE) ×2
SUT SILK 3 0 SH CR/8 (SUTURE) ×9 IMPLANT
SUT SILK 3-0 18XBRD TIE 12 (SUTURE) ×1 IMPLANT
SUT VIC AB 2-0 CT1 27 (SUTURE)
SUT VIC AB 2-0 CT1 27XBRD (SUTURE) IMPLANT
SUT VIC AB 3-0 PS2 18 (SUTURE) ×2
SUT VIC AB 3-0 PS2 18XBRD (SUTURE) ×1 IMPLANT
SUT VIC AB 3-0 SH 27 (SUTURE) ×4
SUT VIC AB 3-0 SH 27XBRD (SUTURE) ×2 IMPLANT
SUT VIC AB 4-0 SH 18 (SUTURE) IMPLANT
SUT VICRYL 0 UR6 27IN ABS (SUTURE) IMPLANT
SYR 30ML LL (SYRINGE) ×3 IMPLANT
SYS LAPSCP GELPORT 120MM (MISCELLANEOUS) ×3
SYSTEM LAPSCP GELPORT 120MM (MISCELLANEOUS) ×1 IMPLANT
TOWEL OR 17X26 10 PK STRL BLUE (TOWEL DISPOSABLE) ×3 IMPLANT
TRAY FOLEY W/METER SILVER 16FR (SET/KITS/TRAYS/PACK) ×3 IMPLANT
TROCAR BLADELESS OPT 5 100 (ENDOMECHANICALS) ×3 IMPLANT
TROCAR XCEL NON-BLD 11X100MML (ENDOMECHANICALS) IMPLANT
TROCAR XCEL UNIV SLVE 11M 100M (ENDOMECHANICALS) IMPLANT
TUBING CONNECTING 10 (TUBING) IMPLANT
TUBING CONNECTING 10' (TUBING)
TUBING FILTER THERMOFLATOR (ELECTROSURGICAL) ×3 IMPLANT
YANKAUER SUCT BULB TIP NO VENT (SUCTIONS) ×3 IMPLANT

## 2015-01-31 NOTE — Op Note (Signed)
0/14/2016  1:57 PM  PATIENT:  Jonathan Graves  54 y.o. male  PRE-OPERATIVE DIAGNOSIS:  DIVERTICULITIS  POST-OPERATIVE DIAGNOSIS:  DIVERTICULITIS  PROCEDURE:  Procedure(s): LAPAROSCOPIC ASSISTED SIGMOID COLECTOMY  LAPAROSCOPIC SPLENIC FLEXURE MOBILIZATION (N/A)  SURGEON:  Surgeon(s) and Role:    * Axel FillerArmando Claude Swendsen, MD - Primary    * Luretha MurphyMatthew Martin, MD - Assisting  ANESTHESIA:   local and general  EBL: Total I/O In: 1000 [I.V.:1000] Out: 350 [Urine:200; Blood:150]  BLOOD ADMINISTERED:none  DRAINS: none   LOCAL MEDICATIONS USED:  BUPIVICAINE   SPECIMEN:  Source of Specimen:  Sigmoid colon  DISPOSITION OF SPECIMEN:  PATHOLOGY  COUNTS:  YES  TOURNIQUET:  * No tourniquets in log *  DICTATION: .Dragon Dictation  After the patient was consented he was taken back to the OR and placed in the lithotomy position with bilateral SCDs in place. The patient underwent general endotracheal intubation. The patient was then prepped and draped in usual sterile fashion. Timeout was called all facts were verified.  A Veress needle technique was used to insufflate the abdomen to 15 mmHg in the right lower quadrant. Subsequent to this a 5 mm trocar and camera were then placed intra-abdominally. There is no injury to any and abdominal organs. At this time the 5 mm trocar was placed just superior to the umbilicus under direct visualization. A third 5 working port was in placed in the right upper quadrant under direct visualization. At this time I proceeded to place the patient in Trendelenburg position. The left side was tilted. At this time it was evident there was large amount of adhesions to the left lower quadrant. The sigmoid colon was seen adherent to the lateral abdominal wall. There were some flimsy adhesions. These were taken down sharply. The sigmoid colon was very indurated and inflamed. The sigmoid colon was densely adherent to the lateral abdominal wall. I proceeded to begin mobilizing the  left colon from a proximal to distal fashion. The left proximal colon was retracted medially while the white line of Toldt was then incised from a proximal distal fashion. Secondary to dense adhesions sigmoid colon and the distal left colon with proceeded to mobilize the splenic flexure.  The omentum was elevated in the transverse colon was retracted inferiorly. The omentum was dissected off the transverse colon from a mid transverse colon to splenic flexure direction. The splenic flexure was easily mobilized inferiorly. There were minimal adhesions. This allowed us to medialize the left proximal colon as the colon was easily retracted. Secondary to the induration of the sigmoid colon and the difficulty in retracting the sigmoid colon proceeded to place a hand port. This was done in the midline just inferior the umbilicus. A 10 blade was used to incise the skin cutter used to maintain hemostasis and dissection was taken down to the midline fascia. This was incised. The peritoneum was bluntly entered. At this time the hand port was in placed. Insufflation was continued. At this time I was able to palpate the sigmoid colon and again was very indurated and firm. The dense adhesions to the lateral abdominal wall for finger fracture.  There was an area of small bowel that was adherent to the sigmoid colon medially. This was finger fractured and dissected away. At this time I felt that secondary to the fact that the patient's sigmoid colon was densely adherent to surrounding structures in appropriate planes were difficult to palpate secondary to inflammation I proceeded to convert to a open technique. The HandPort was removed.  The skin incision was extended just over the umbilicus the fascia, and the fascia was extended to the length of the skin incisions at this time a Balfour retractor was placed. We proceeded to visualize the small bowel that was finger fractured away from the sigmoid colon. There appeared to be 2  loops. This appeared to be walling off the previous abscess. The serosa of the small bowels were not perforated however inflamed. The raw edges of the proximal bowel was reapproximated using a 3-0 silk in a Lembert fashion. There was no overt perforation of the small bowel. The distal portion of the adherent small bowel was not well and no stitches were placed.  At this time we were able to palpate a mobile, soft portion of the left colon. An area of the proximal left colon was selected and transected using a 75 GIA, blue load. This allowed [place] to mobilize the proximal portion of the sigmoid colon up to the incision site. A LigaSure device was used to ligate the sigmoid mesentery. This was taken down following the colon wall and mesentery. We were not deep in the posterior pelvic wall. The left ureter was not visualized and was not sought out secondary to the fact that the posterior peritoneum overlying the area of the ureter wasn't clearly an inflamed, thickened. We proceeded to ligate the sigmoid mesentery inferiorly to a normal portion, soft portion of proximal rectum. The surrounding mesentery was cleared away using electrocautery. A blue load contour stapler was then used to transect the proximal portion of the rectum. At this time the specimen was sent to pathology. A stitch was placed on the proximal end.  At this time hemostasis was achieved of the posterior pelvic wall. A few bleeding blood vessels were ligated in a figure-of-eight fashion. At this time we checked for mobility of the proximal portion of left colon down into the pelvis wall. There was some adherent bands in the left upper quadrant. These were manually dissect away from the retroperitoneum. At this time this allowed the left colon to easily reach down into the rectal stump. Secondary to the fact that the rectal stump was rather lengthy I decided to proceed with a end to side Baker type anastomosis.  ThE Baker's anastomosis was done in a  two-layered fashion with posterior wall of 3-0 interrupted silks, internal running 2-0 Vicryl and anterior wall of 3-0 interrupted silks. It should be noted that the staple line was excised prior to the anastomosis. The anastomosis was patent. At this time we irrigated out the pelvic and abdominal cavity with multiple liters of sterile saline.  At this time the drapes and gowns/gloves/instruments were changed out to clean instruments as per protocol.  New drapes were placed. At this time proceeded to ensure that the NG tube was within the stomach. The omentum was brought over the anterior incision site. #1 PDS single stranded stitches were used to approximate the fascia in a standard running fashion. The subcutaneous fat was irrigated out. The skin was reapproximated in the midline wound using staples. All ports were removed. The ports at the reapproximated using interrupted staples. The midline wound was dressed with honeycomb dressing. The port sites were covered with 2 x 2's and tape.  The patient the procedure well was taken to the recovery room stable condition.   PLAN OF CARE: Admit to inpatient   PATIENT DISPOSITION:  PACU - hemodynamically stable.   Delay start of Pharmacological VTE agent (>24hrs) due to surgical blood loss  or risk of bleeding: not applicable

## 2015-01-31 NOTE — Anesthesia Preprocedure Evaluation (Signed)
Anesthesia Evaluation  Patient identified by MRN, date of birth, ID band Patient awake    Reviewed: Allergy & Precautions, H&P , NPO status , Patient's Chart, lab work & pertinent test results  History of Anesthesia Complications Negative for: history of anesthetic complications  Airway Mallampati: II  TM Distance: >3 FB Neck ROM: full    Dental no notable dental hx.    Pulmonary neg pulmonary ROS, Current Smoker,    Pulmonary exam normal breath sounds clear to auscultation       Cardiovascular hypertension, Pt. on medications Normal cardiovascular exam Rhythm:regular Rate:Normal     Neuro/Psych negative neurological ROS     GI/Hepatic negative GI ROS, Neg liver ROS,   Endo/Other  obesity  Renal/GU negative Renal ROS     Musculoskeletal   Abdominal   Peds  Hematology negative hematology ROS (+)   Anesthesia Other Findings   Reproductive/Obstetrics negative OB ROS                             Anesthesia Physical Anesthesia Plan  ASA: III  Anesthesia Plan: General   Post-op Pain Management:    Induction: Intravenous  Airway Management Planned: Oral ETT  Additional Equipment: None  Intra-op Plan:   Post-operative Plan: Extubation in OR  Informed Consent: I have reviewed the patients History and Physical, chart, labs and discussed the procedure including the risks, benefits and alternatives for the proposed anesthesia with the patient or authorized representative who has indicated his/her understanding and acceptance.   Dental Advisory Given  Plan Discussed with: Anesthesiologist, CRNA and Surgeon  Anesthesia Plan Comments:         Anesthesia Quick Evaluation

## 2015-01-31 NOTE — Transfer of Care (Signed)
Immediate Anesthesia Transfer of Care Note  Patient: Jonathan Graves  Procedure(s) Performed: Procedure(s): LAPAROSCOPIC SIGMOID COLECTOMY SPLENIC FLEXURE MOBILIZATION (N/A)  Patient Location: PACU  Anesthesia Type:General  Level of Consciousness:  sedated, patient cooperative and responds to stimulation  Airway & Oxygen Therapy:Patient Spontanous Breathing and Patient connected to face mask oxgen  Post-op Assessment:  Report given to PACU RN and Post -op Vital signs reviewed and stable  Post vital signs:  Reviewed and stable  Last Vitals:  Filed Vitals:   01/31/15 1400  BP:   Pulse:   Temp: 36.7 C  Resp:     Complications: No apparent anesthesia complicationsImmediate Anesthesia Transfer of Care Note  Patient: Jonathan Graves  Procedure(s) Performed: Procedure(s): LAPAROSCOPIC SIGMOID COLECTOMY SPLENIC FLEXURE MOBILIZATION (N/A)  Patient Location: PACU  Anesthesia Type:General  Level of Consciousness: awake, alert , oriented and patient cooperative  Airway & Oxygen Therapy: Patient Spontanous Breathing and Patient connected to face mask oxygen  Post-op Assessment: Report given to RN, Post -op Vital signs reviewed and stable and Patient moving all extremities X 4  Post vital signs: Reviewed and stable  Last Vitals:  Filed Vitals:   01/31/15 0735  BP: 129/79  Pulse: 75  Temp: 36.4 C  Resp: 18    Complications: No apparent anesthesia complications

## 2015-01-31 NOTE — Discharge Instructions (Signed)
SURGERY: POST OP INSTRUCTIONS (Surgery for colon resection)   1. DIET: Follow a light bland diet the first 24 hours after arrival home, such as soup, liquids, crackers, etc.  Be sure to include lots of fluids daily.  Avoid fast food or heavy meals as your are more likely to get nauseated.  Stay on a low fat diet the next few days after surgery.  Gradually add a fiber supplement to your diet over the next week.   Your should try to eat a low-fat, high fiber diet the rest of your life thereafter (See Below).   2. Take your usually prescribed home medications unless otherwise directed.  OK to take aspirin.    If you are on strong blood thinners (warfarin/Coumadin, Plavix, Xerelto, Eliquis, etc), discuss with your surgeon, medicine PCP, and/or cardiologist for instructions on when to restart the blood thinner & if blood monitoring is needed (PT/INR blood check, etc).  Usually you can restart any strong blood thinners after the second postoperative day.  3. PAIN CONTROL:  Pain after surgery or related to activity is often due to strain/injury to muscle, tendon, nerves and/or incisions.  This pain is usually short-term and will improve in a few months.   Many people find it helpful to do the following things TOGETHER to help speed the process of healing and to get back to regular activity more quickly:  1. Avoid heavy physical activity at first a. No lifting greater than 20 pounds at first, then increase to lifting as tolerated over the next few weeks b. Do not push through the pain.  Listen to your body and avoid positions and maneuvers than reproduce the pain.  Wait a few days before trying something more intense c. Walking is okay as tolerated, but go slowly and stop when getting sore.  If you can walk 30 minutes without stopping or pain, you can try more intense activity (running, jogging, aerobics, cycling, swimming, treadmill, sex, sports, weightlifting, etc ) d. Remember: If it hurts to do it,  then dont do it!  2. Take Anti-inflammatory medication a. Choose ONE of the following over-the-counter medications: i.            Acetaminophen 500mg  tabs (Tylenol) 1-2 pills with every meal and just before bedtime (avoid if you have liver problems) ii.            Naproxen 220mg  tabs (ex. Aleve) 1-2 pills twice a day (avoid if you have kidney, stomach, IBD, or bleeding problems) iii. Ibuprofen 200mg  tabs (ex. Advil, Motrin) 3-4 pills with every meal and just before bedtime (avoid if you have kidney, stomach, IBD, or bleeding problems) b. Take with food/snack around the clock for 1-2 weeks i. This helps the muscle and nerve tissues become less irritable and calm down faster  3. Use a Heating pad or Ice/Cold Pack a. Most patients will experience some swelling and bruising around the incisions.  Swelling and bruising can take several weeks to resolve. i. Ice packs or heating pads (30-60 minutes up to 6 times a day) will help. ii. Use ice for the first few days to help decrease swelling and bruising iii. Switch to heat to help relax tight/sore spots and speed recovery.  iv. Some people prefer to use ice alone, heat alone, alternating between ice & heat.  Experiment to what works for you a. May use warm bath/hottub  or showers  4. Try Gentle Massage and/or Stretching  a. at the area of pain many times a  day b. stop if you feel pain - do not overdo it 5. Prescription for pain medication (such as oxycodone, hydrocodone, etc) should be given to you upon discharge.  Take your pain medication as prescribed. a. If you are having problems/concerns with the prescription medicine (does not control pain, nausea, vomiting, rash, itching, etc), please call us [phone] to see if we need to switch you to a different pain medicine that will work better for you and/or control your side effect better. b. If you need a refill on your pain medication, please contact your pharmacy.  They will contact our office  to request authorization. Prescriptions will not be filled after 5 pm or on week-ends. c.  Try these steps together to help you body heal faster and avoid making things get worse.  Doing just one of these things may not be enough.    If you are not getting better after two weeks or are noticing you are getting worse, contact our office for further advice; we may need to re-evaluate you & see what other things we can do to help.   GETTING TO GOOD BOWEL HEALTH. Irregular bowel habits such as constipation and diarrhea can lead to many problems over time.  Having one soft bowel movement a day is the most important way to prevent further problems.  The anorectal canal is designed to handle stretching and feces to safely manage our ability to get rid of solid waste (feces, poop, stool) out of our body.  BUT, hard constipated stools can act like ripping concrete bricks and diarrhea can be a burning fire to this very sensitive area of our body, causing inflamed hemorrhoids, anal fissures, increasing risk is perirectal abscesses, abdominal pain/bloating, an making irritable bowel worse.      The goal: ONE SOFT BOWEL MOVEMENT A DAY!  To have soft, regular bowel movements:   Drink plenty of fluids, consider 4-6 tall glasses of water a day.    Take plenty of fiber.  Fiber is the undigested part of plant food that passes into the colon, acting s natures broom to encourage bowel motility and movement.  Fiber can absorb and hold large amounts of water. This results in a larger, bulkier stool, which is soft and easier to pass. Work gradually over several weeks up to 6 servings a day of fiber (25g a day even more if needed) in the form of: o Vegetables -- Root (potatoes, carrots, turnips), leafy green (lettuce, salad greens, celery, spinach), or cooked high residue (cabbage, broccoli, etc) o Fruit -- Fresh (unpeeled skin & pulp), Dried (prunes, apricots, cherries, etc ),  or stewed ( applesauce)  o Whole grain  breads, pasta, etc (whole wheat)  o Bran cereals   Bulking Agents -- This type of water-retaining fiber generally is easily obtained each day by one of the following:  o Psyllium bran -- The psyllium plant is remarkable because its ground seeds can retain so much water. This product is available as Metamucil, Konsyl, Effersyllium, Per Diem Fiber, or the less expensive generic preparation in drug and health food stores. Although labeled a laxative, it really is not a laxative.  o Methylcellulose -- This is another fiber derived from wood which also retains water. It is available as Citrucel. o Polyethylene Glycol - and artificial fiber commonly called Miralax or Glycolax.  It is helpful for people with gassy or bloated feelings with regular fiber o Flax Seed - a less gassy fiber than psyllium  No reading  or other relaxing activity while on the toilet. If bowel movements take longer than 5 minutes, you are too constipated  AVOID CONSTIPATION.  High fiber and water intake usually takes care of this.  Sometimes a laxative is needed to stimulate more frequent bowel movements, but   Laxatives are not a good long-term solution as it can wear the colon out.  They can help jump-start bowels if constipated, but should be relied on constantly without discussing with your doctor o Osmotics (Milk of Magnesia, Fleets phosphosoda, Magnesium citrate, MiraLax, GoLytely) are safer than  o Stimulants (Senokot, Castor Oil, Dulcolax, Ex Lax)    o Avoid taking laxatives for more than 7 days in a row.   IF SEVERELY CONSTIPATED, try a Bowel Retraining Program: o Do not use laxatives.  o Eat a diet high in roughage, such as bran cereals and leafy vegetables.  o Drink six (6) ounces of prune or apricot juice each morning.  o Eat two (2) large servings of stewed fruit each day.  o Take one (1) heaping tablespoon of a psyllium-based bulking agent twice a day. Use sugar-free sweetener when possible to avoid excessive  calories.  o Eat a normal breakfast.  o Set aside 15 minutes after breakfast to sit on the toilet, but do not strain to have a bowel movement.  o If you do not have a bowel movement by the third day, use an enema and repeat the above steps.   Controlling diarrhea o Switch to liquids and simpler foods for a few days to avoid stressing your intestines further. o Avoid dairy products (especially milk & ice cream) for a short time.  The intestines often can lose the ability to digest lactose when stressed. o Avoid foods that cause gassiness or bloating.  Typical foods include beans and other legumes, cabbage, broccoli, and dairy foods.  Every person has some sensitivity to other foods, so listen to our body and avoid those foods that trigger problems for you. o Adding fiber (Citrucel, Metamucil, psyllium, Miralax) gradually can help thicken stools by absorbing excess fluid and retrain the intestines to act more normally.  Slowly increase the dose over a few weeks.  Too much fiber too soon can backfire and cause cramping & bloating. o Probiotics (such as active yogurt, Align, etc) may help repopulate the intestines and colon with normal bacteria and calm down a sensitive digestive tract.  Most studies show it to be of mild help, though, and such products can be costly. o Medicines: - Bismuth subsalicylate (ex. Kayopectate, Pepto Bismol) every 30 minutes for up to 6 doses can help control diarrhea.  Avoid if pregnant. - Loperamide (Immodium) can slow down diarrhea.  Start with two tablets (  total) first and then try one tablet every 6 hours.  Avoid if you are having fevers or severe pain.  If you are not better or start feeling worse, stop all medicines and call your doctor for advice o Call your doctor if you are getting worse or not better.  Sometimes further testing (cultures, endoscopy, X-ray studies, bloodwork, etc) may be needed to help diagnose and treat the cause of the  diarrhea.  TROUBLESHOOTING IRREGULAR BOWELS 1) Avoid extremes of bowel movements (no bad constipation/diarrhea) 2) Miralax 17gm mixed in 8oz. water or juice-daily. May use BID as needed.  3) Gas-x,Phazyme, etc. as needed for gas & bloating.  4) Soft,bland diet. No spicy,greasy,fried foods.  5) Prilosec over-the-counter as needed  6) May hold gluten/wheat products from diet  to see if symptoms improve.  7)  May try probiotics (Align, Activa, etc) to help calm the bowels down 7) If symptoms become worse call back immediately.   4. Wash / shower every day.  You may shower over the incision / wound.  Avoid baths until 5 days after surgery.  Continue to shower over incision(s) after the dressing is off.  5. Remove your waterproof bandages 5 days after surgery.  You may leave the incision open to air.  Remove any wicks or ribbons in your wound.  If you have an open wound, please see wound care instructions. You may replace a dressing/Band-Aid to cover the incision for comfort if you wish.  6. ACTIVITIES as tolerated:   a. You may resume regular (light) daily activities beginning the next day--such as daily self-care, walking, climbing stairs--gradually increasing activities as tolerated.  If you can walk 30 minutes without difficulty, it is safe to try more intense activity such as jogging, treadmill, bicycling, low-impact aerobics, swimming, etc. b. Save the most intensive and strenuous activity for last (Usually 3-6 weeks after surgery) such as sit-ups, heavy lifting, contact sports, etc  Refrain from any heavy lifting or straining until you are off narcotics for pain control.   c. DO NOT PUSH THROUGH PAIN.  Let pain be your guide: If it hurts to do something, don't do it.  Pain is your body warning you to avoid that activity for another week until the pain goes down. d. You may drive when you are no longer taking prescription pain medication, you can comfortably wear a seatbelt, and you can safely  maneuver your car and apply brakes. e. Bonita QuinYou may have sexual intercourse when it is comfortable. If it hurts to do something, don't do it.  7. FOLLOW UP in our office a. Please call [facility] at [phone] to set up an appointment to see your surgeon in the office for a follow-up appointment approximately 2-3 weeks after your surgery. b. Make sure that you call for this appointment the day you arrive home to insure a convenient appointment time.  8. IF YOU HAVE DISABILITY OR FAMILY LEAVE FORMS, BRING THEM TO THE OFFICE FOR PROCESSING.  DO NOT GIVE THEM TO YOUR DOCTOR.   WHEN TO CALL US [phone](336) [phone]: 1. Poor pain control 2. Reactions / problems with new medications (rash/itching, nausea, etc)  3. Fever over 101.5 F (38.5 C) 4. Inability to urinate 5. Nausea and/or vomiting 6. Worsening swelling or bruising 7. Continued bleeding from incision. 8. Increased pain, redness, or drainage from the incision  The clinic staff is available to answer your questions during regular business hours (8:30am-5pm).  Please dont hesitate to call and ask to speak to one of our nurses for clinical concerns.   A [facility] from [facility] is always on call at the hospitals   If you have a medical emergency, go to the nearest emergency room or call 911.    [facility] [address], [address], [place], [place]  [id number] ? MAIN: (336) [phone] ? TOLL FREE: [phone] ? FAX [phone](336) [phone] [email or web address]

## 2015-01-31 NOTE — H&P (Signed)
Jonathan Graves is an 54 y.o. male.    HPI: The patient is a 54 year old male who presents with diverticulitis. 54 year old male who previously has had approximately 3 episodes of diverticulitis with the most recent being perforated diverticulitis requiring drain placement. Since patient's last visit in May the patient has had his drain removed. Patient's last colonoscopy was in October 2015. This revealed diverticuli. There is no masses seen.  Past Medical History  Diagnosis Date  . Hypertension   . Diverticulitis     Past Surgical History  Procedure Laterality Date  . Hammer toe surgery    . Biceps tendon repair Right     2'16 -Dr. Amanda PeaGramig  . Vasectomy    . Colonoscopy      Family History  Problem Relation Age of Onset  . Gastric cancer Mother   . Breast cancer Mother   . Hypertension Father    Social History:  reports that he has been smoking Cigarettes.  He has been smoking about 0.25 packs per day. He does not have any smokeless tobacco history on file. He reports that he drinks about 16.2 oz of alcohol per week. He reports that he does not use illicit drugs.  Allergies: No Known Allergies  No prescriptions prior to admission    No results found for this or any previous visit (from the past 48 hour(s)). No results found.  Review of Systems  Constitutional: Negative for weight loss.  HENT: Negative for ear discharge, ear pain, hearing loss and tinnitus.   Eyes: Negative for blurred vision, double vision, photophobia and pain.  Respiratory: Negative for cough, sputum production and shortness of breath.   Cardiovascular: Negative for chest pain.  Gastrointestinal: Negative for nausea, vomiting and abdominal pain.  Genitourinary: Negative for dysuria, urgency, frequency and flank pain.  Musculoskeletal: Negative for myalgias, back pain, joint pain, falls and neck pain.  Neurological: Negative for dizziness, tingling, sensory change, focal weakness, loss of consciousness  and headaches.  Endo/Heme/Allergies: Does not bruise/bleed easily.  Psychiatric/Behavioral: Negative for depression, memory loss and substance abuse. The patient is not nervous/anxious.     There were no vitals taken for this visit. Physical Exam  Constitutional: He is oriented to person, place, and time. He appears well-developed and well-nourished.  HENT:  Head: Normocephalic and atraumatic.  Eyes: Conjunctivae and EOM are normal. Pupils are equal, round, and reactive to light.  Neck: Normal range of motion. Neck supple.  Cardiovascular: Normal rate, regular rhythm and normal heart sounds.   Respiratory: Effort normal and breath sounds normal.  GI: Soft. Bowel sounds are normal. He exhibits no distension. There is no tenderness. There is no rebound and no guarding.  Musculoskeletal: Normal range of motion.  Neurological: He is alert and oriented to person, place, and time.  Skin: Skin is dry.     Assessment/Plan Impression: 54 year old male with a third episode of diverticulitis, perforated requiring drain placement. Patient has been doing well since his hospitalization in April. He's had his drain removed. We did discuss the likelihood of increasing recurrences of the diverticulitis. The patient is ready to proceed with sigmoidectomy.  1. We'll schedule the patient for a laparoscopic sigmoidectomy. 2. We discussed this and benefits of the procedure to include but not limited to: Infection, bleeding, possible leakage of anastomosis possible requirement for drainage placement, possible ostomy. The patient was understanding and wishes to proceed.  Marigene Ehlersamirez Jr., Joniyah Mallinger 01/31/2015, 7:21 AM

## 2015-01-31 NOTE — Anesthesia Procedure Notes (Signed)
Procedure Name: Intubation Date/Time: 01/31/2015 10:34 AM Performed by: Early OsmondEARGLE, Cameran Pettey E Pre-anesthesia Checklist: Patient identified, Emergency Drugs available, Suction available and Patient being monitored Patient Re-evaluated:Patient Re-evaluated prior to inductionOxygen Delivery Method: Circle System Utilized Preoxygenation: Pre-oxygenation with 100% oxygen Intubation Type: IV induction Ventilation: Mask ventilation without difficulty Laryngoscope Size: Miller and 3 Grade View: Grade I Tube type: Oral Tube size: 7.5 mm Number of attempts: 1 Airway Equipment and Method: Stylet Placement Confirmation: ETT inserted through vocal cords under direct vision,  positive ETCO2 and breath sounds checked- equal and bilateral Secured at: 21 cm Tube secured with: Tape Dental Injury: Teeth and Oropharynx as per pre-operative assessment

## 2015-02-01 LAB — BASIC METABOLIC PANEL
Anion gap: 7 (ref 5–15)
BUN: 14 mg/dL (ref 6–20)
CALCIUM: 8.6 mg/dL — AB (ref 8.9–10.3)
CO2: 24 mmol/L (ref 22–32)
CREATININE: 1.14 mg/dL (ref 0.61–1.24)
Chloride: 106 mmol/L (ref 101–111)
GFR calc Af Amer: >60 mL/min (ref >60)
GLUCOSE: 170 mg/dL — AB (ref 65–99)
Potassium: 4.2 mmol/L (ref 3.5–5.1)
Sodium: 137 mmol/L (ref 135–145)

## 2015-02-01 LAB — CBC
HCT: 37 % — ABNORMAL LOW (ref 39.0–52.0)
Hemoglobin: 13 g/dL (ref 13.0–17.0)
MCH: 34.7 pg — AB (ref 26.0–34.0)
MCHC: 35.1 g/dL (ref 30.0–36.0)
MCV: 98.7 fL (ref 78.0–100.0)
Platelets: 199 10*3/uL (ref 150–400)
RBC: 3.75 MIL/uL — ABNORMAL LOW (ref 4.22–5.81)
RDW: 13.4 % (ref 11.5–15.5)
WBC: 12.4 10*3/uL — ABNORMAL HIGH (ref 4.0–10.5)

## 2015-02-01 NOTE — Progress Notes (Signed)
1 Day Post-Op  Subjective: Pain well-controlled.   Objective: Vital signs in last 24 hours: Temp:  [98 F (36.7 C)-98.7 F (37.1 C)] 98.7 F (37.1 C) (10/15 0500) Pulse Rate:  [68-86] 72 (10/15 0500) Resp:  [11-17] 12 (10/15 0800) BP: (150-175)/(79-93) 163/85 mmHg (10/15 0500) SpO2:  [94 %-100 %] 96 % (10/15 0800) Last BM Date: 01/31/15  Intake/Output from previous day: 10/14 0701 - 10/15 0700 In: 4106.3 [I.V.:4056.3; IV Piggyback:50] Out: 1180 [Urine:830; Emesis/NG output:200; Blood:150] Intake/Output this shift:    PE: General- In NAD Abdomen-soft, quiet, dressings dry  Lab Results:   Recent Labs  01/31/15 1621 02/01/15 0513  WBC 12.4* 12.4*  HGB 13.8 13.0  HCT 39.9 37.0*  PLT 189 199   BMET  Recent Labs  02/01/15 0513  NA 137  K 4.2  CL 106  CO2 24  GLUCOSE 170*  BUN 14  CREATININE 1.14  CALCIUM 8.6*   PT/INR No results for input(s): LABPROT, INR in the last 72 hours. Comprehensive Metabolic Panel:    Component Value Date/Time   NA 137 02/01/2015 0513   NA 137 01/22/2015 0844   K 4.2 02/01/2015 0513   K 4.7 01/22/2015 0844   CL 106 02/01/2015 0513   CL 104 01/22/2015 0844   CO2 24 02/01/2015 0513   CO2 28 01/22/2015 0844   BUN 14 02/01/2015 0513   BUN 17 01/22/2015 0844   CREATININE 1.14 02/01/2015 0513   CREATININE 1.11 01/22/2015 0844   GLUCOSE 170* 02/01/2015 0513   GLUCOSE 112* 01/22/2015 0844   CALCIUM 8.6* 02/01/2015 0513   CALCIUM 9.3 01/22/2015 0844   AST 20 08/17/2014 0616   AST 23 08/16/2014 1926   ALT 20 08/17/2014 0616   ALT 27 08/16/2014 1926   ALKPHOS 38* 08/17/2014 0616   ALKPHOS 48 08/16/2014 1926   BILITOT 0.4 08/17/2014 0616   BILITOT 0.5 08/16/2014 1926   PROT 6.0 08/17/2014 0616   PROT 7.4 08/16/2014 1926   ALBUMIN 2.8* 08/17/2014 0616   ALBUMIN 3.5 08/16/2014 1926     Studies/Results: No results found.  Anti-infectives: Anti-infectives    Start     Dose/Rate Route Frequency Ordered Stop   01/31/15  2200  cefoTEtan (CEFOTAN) 2 g in dextrose 5 % 50 mL IVPB     2 g 100 mL/hr over 30 Minutes Intravenous Every 12 hours 01/31/15 1552 01/31/15 2136   01/31/15 0742  cefoTEtan (CEFOTAN) 2 g in dextrose 5 % 50 mL IVPB     2 g 100 mL/hr over 30 Minutes Intravenous On call to O.R. 01/31/15 0742 01/31/15 1100      Assessment Active Problems:   S/P laparoscopic-assisted sigmoidectomy-stable overnight    LOS: 1 day   Plan: OOB.  Incentive spirometry.   Jonathan Graves J 02/01/2015

## 2015-02-01 NOTE — Anesthesia Postprocedure Evaluation (Signed)
  Anesthesia Post-op Note  Patient: Jonathan Graves  Procedure(s) Performed: Procedure(s) (LRB): LAPAROSCOPIC SIGMOID COLECTOMY SPLENIC FLEXURE MOBILIZATION (N/A)  Patient Location: PACU  Anesthesia Type: General  Level of Consciousness: awake and alert   Airway and Oxygen Therapy: Patient Spontanous Breathing  Post-op Pain: mild  Post-op Assessment: Post-op Vital signs reviewed, Patient's Cardiovascular Status Stable, Respiratory Function Stable, Patent Airway and No signs of Nausea or vomiting  Last Vitals:  Filed Vitals:   02/01/15 0800  BP:   Pulse:   Temp:   Resp: 12    Post-op Vital Signs: stable   Complications: No apparent anesthesia complications

## 2015-02-02 MED ORDER — KETOROLAC TROMETHAMINE 30 MG/ML IJ SOLN
30.0000 mg | Freq: Four times a day (QID) | INTRAMUSCULAR | Status: AC
  Administered 2015-02-02 – 2015-02-03 (×5): 30 mg via INTRAVENOUS
  Filled 2015-02-02 (×5): qty 1

## 2015-02-02 MED ORDER — HYDROMORPHONE HCL 1 MG/ML IJ SOLN
0.5000 mg | INTRAMUSCULAR | Status: DC | PRN
  Administered 2015-02-02: 0.5 mg via INTRAVENOUS
  Filled 2015-02-02: qty 1

## 2015-02-02 NOTE — Progress Notes (Signed)
2 Days Post-Op  Subjective: Walking a lot.  No flatus or BM.  Not using PCA much.   Objective: Vital signs in last 24 hours: Temp:  [98 F (36.7 C)-98.9 F (37.2 C)] 98 F (36.7 C) (10/16 0600) Pulse Rate:  [59-73] 60 (10/16 0600) Resp:  [10-16] 14 (10/16 0600) BP: (143-167)/(77-84) 167/84 mmHg (10/16 0600) SpO2:  [97 %-99 %] 98 % (10/16 0600) Last BM Date: 01/31/15  Intake/Output from previous day: 10/15 0701 - 10/16 0700 In: 3000 [I.V.:3000] Out: 3250 [Urine:2200; Emesis/NG output:1050] Intake/Output this shift:    PE: General- In NAD Abdomen-soft, quiet, dressings dry  Lab Results:   Recent Labs  01/31/15 1621 02/01/15 0513  WBC 12.4* 12.4*  HGB 13.8 13.0  HCT 39.9 37.0*  PLT 189 199   BMET  Recent Labs  02/01/15 0513  NA 137  K 4.2  CL 106  CO2 24  GLUCOSE 170*  BUN 14  CREATININE 1.14  CALCIUM 8.6*   PT/INR No results for input(s): LABPROT, INR in the last 72 hours. Comprehensive Metabolic Panel:    Component Value Date/Time   NA 137 02/01/2015 0513   NA 137 01/22/2015 0844   K 4.2 02/01/2015 0513   K 4.7 01/22/2015 0844   CL 106 02/01/2015 0513   CL 104 01/22/2015 0844   CO2 24 02/01/2015 0513   CO2 28 01/22/2015 0844   BUN 14 02/01/2015 0513   BUN 17 01/22/2015 0844   CREATININE 1.14 02/01/2015 0513   CREATININE 1.11 01/22/2015 0844   GLUCOSE 170* 02/01/2015 0513   GLUCOSE 112* 01/22/2015 0844   CALCIUM 8.6* 02/01/2015 0513   CALCIUM 9.3 01/22/2015 0844   AST 20 08/17/2014 0616   AST 23 08/16/2014 1926   ALT 20 08/17/2014 0616   ALT 27 08/16/2014 1926   ALKPHOS 38* 08/17/2014 0616   ALKPHOS 48 08/16/2014 1926   BILITOT 0.4 08/17/2014 0616   BILITOT 0.5 08/16/2014 1926   PROT 6.0 08/17/2014 0616   PROT 7.4 08/16/2014 1926   ALBUMIN 2.8* 08/17/2014 0616   ALBUMIN 3.5 08/16/2014 1926     Studies/Results: No results found.  Anti-infectives: Anti-infectives    Start     Dose/Rate Route Frequency Ordered Stop   01/31/15  2200  cefoTEtan (CEFOTAN) 2 g in dextrose 5 % 50 mL IVPB     2 g 100 mL/hr over 30 Minutes Intravenous Every 12 hours 01/31/15 1552 01/31/15 2136   01/31/15 0742  cefoTEtan (CEFOTAN) 2 g in dextrose 5 % 50 mL IVPB     2 g 100 mL/hr over 30 Minutes Intravenous On call to O.R. 01/31/15 0742 01/31/15 1100      Assessment Active Problems:   S/P laparoscopic-assisted sigmoidectomy-bilious ng output and minimal bowel sounds c/w postop ileus    LOS: 2 days   Plan:  Check electrolytes tomorrow.  Wait for ileus to resolve. Toradol.  D/C PCA.   Fate Caster J 02/02/2015

## 2015-02-03 ENCOUNTER — Inpatient Hospital Stay (HOSPITAL_COMMUNITY): Payer: BLUE CROSS/BLUE SHIELD

## 2015-02-03 ENCOUNTER — Encounter (HOSPITAL_COMMUNITY): Payer: Self-pay | Admitting: General Surgery

## 2015-02-03 LAB — BASIC METABOLIC PANEL
ANION GAP: 8 (ref 5–15)
BUN: 9 mg/dL (ref 6–20)
CO2: 27 mmol/L (ref 22–32)
Calcium: 8.6 mg/dL — ABNORMAL LOW (ref 8.9–10.3)
Chloride: 105 mmol/L (ref 101–111)
Creatinine, Ser: 0.95 mg/dL (ref 0.61–1.24)
Glucose, Bld: 136 mg/dL — ABNORMAL HIGH (ref 65–99)
POTASSIUM: 3.6 mmol/L (ref 3.5–5.1)
SODIUM: 140 mmol/L (ref 135–145)

## 2015-02-03 MED ORDER — KCL IN DEXTROSE-NACL 40-5-0.9 MEQ/L-%-% IV SOLN
INTRAVENOUS | Status: DC
  Administered 2015-02-03 (×2): via INTRAVENOUS
  Administered 2015-02-04: 125 mL/h via INTRAVENOUS
  Administered 2015-02-04: 08:00:00 via INTRAVENOUS
  Administered 2015-02-05: 125 mL/h via INTRAVENOUS
  Administered 2015-02-05: 14:00:00 via INTRAVENOUS
  Administered 2015-02-05: 125 mL/h via INTRAVENOUS
  Administered 2015-02-06: 14:00:00 via INTRAVENOUS
  Administered 2015-02-06: 125 mL/h via INTRAVENOUS
  Administered 2015-02-06: 21:00:00 via INTRAVENOUS
  Filled 2015-02-03 (×15): qty 1000

## 2015-02-03 MED ORDER — MORPHINE SULFATE (PF) 2 MG/ML IV SOLN: 1.0000 mg | INTRAVENOUS | Status: DC | PRN

## 2015-02-03 NOTE — Progress Notes (Signed)
3 Days Post-Op  Subjective: Pt doing well.  Ambulating. Pain controlled.  Objective: Vital signs in last 24 hours: Temp:  [97.8 F (36.6 C)-98.3 F (36.8 C)] 97.8 F (36.6 C) (10/17 0515) Pulse Rate:  [56-64] 59 (10/17 0515) Resp:  [14-16] 16 (10/17 0515) BP: (158-175)/(73-96) 162/73 mmHg (10/17 0515) SpO2:  [97 %-100 %] 97 % (10/17 0515) Last BM Date: 01/31/15  Intake/Output from previous day: 10/16 0701 - 10/17 0700 In: 3000 [I.V.:3000] Out: 3450 [Urine:500; Emesis/NG output:2950] Intake/Output this shift:    General appearance: alert and cooperative GI: wound c/d/i, approp ttp, decreased BS  Lab Results:   Recent Labs  01/31/15 1621 02/01/15 0513  WBC 12.4* 12.4*  HGB 13.8 13.0  HCT 39.9 37.0*  PLT 189 199   BMET  Recent Labs  02/01/15 0513 02/03/15 0554  NA 137 140  K 4.2 3.6  CL 106 105  CO2 24 27  GLUCOSE 170* 136*  BUN 14 9  CREATININE 1.14 0.95  CALCIUM 8.6* 8.6*    Assessment/Plan: s/p Procedure(s): LAPAROSCOPIC SIGMOID COLECTOMY SPLENIC FLEXURE MOBILIZATION (N/A) plan for KUB as NGT output still elevated  Add K+ to IVF Mobilize IS Await bowel function  LOS: 3 days    Jonathan Ehlersamirez Jr., [facility] 02/03/2015

## 2015-02-04 NOTE — Progress Notes (Signed)
NG tube pull back 5cm per MD instrutions, verified placement.  Patient tolerated well.

## 2015-02-04 NOTE — Progress Notes (Signed)
4 Days Post-Op  Subjective: Pt w/ some n/v this AM with NGT in place. No flatus  Objective: Vital signs in last 24 hours: Temp:  [97.8 F (36.6 C)-98.4 F (36.9 C)] 97.8 F (36.6 C) (10/18 0526) Pulse Rate:  [50-61] 61 (10/18 0526) Resp:  [17-18] 17 (10/18 0526) BP: (166-170)/(81-97) 170/97 mmHg (10/18 0526) SpO2:  [96 %-99 %] 96 % (10/18 0526) Last BM Date: 02/06/15  Intake/Output from previous day: 10/17 0701 - 10/18 0700 In: 2668.8 [I.V.:2668.8] Out: 2600 [Emesis/NG output:2600] Intake/Output this shift:    General appearance: alert and cooperative GI: soft approp ttp, nd, active BS, wound c/d/i  Lab Results:  No results for input(s): WBC, HGB, HCT, PLT in the last 72 hours. BMET  Recent Labs  02/03/15 0554  NA 140  K 3.6  CL 105  CO2 27  GLUCOSE 136*  BUN 9  CREATININE 0.95  CALCIUM 8.6*   PT/INR No results for input(s): LABPROT, INR in the last 72 hours. ABG No results for input(s): PHART, HCO3 in the last 72 hours.  Invalid input(s): PCO2, PO2  Studies/Results: Dg Abd Portable 1v  02/03/2015  CLINICAL DATA:  Postoperative ileus. EXAM: PORTABLE ABDOMEN - 1 VIEW COMPARISON:  CT 09/10/2014 . FINDINGS: NG tube noted good anatomic position. Interval removal of left lower quadrant drainage catheter. Surgical staples noted over the abdomen. Soft tissue structures are unremarkable. No bowel distention. No free air. No acute bony abnormality . IMPRESSION: 1. NG tube noted in good anatomic position. Interim removal of left lower quadrant drainage catheter. 2. No bowel distention.  No evidence of adynamic ileus. Electronically Signed   By: Maisie Fushomas  Register   On: 02/03/2015 08:45    Anti-infectives: Anti-infectives    Start     Dose/Rate Route Frequency Ordered Stop   01/31/15 2200  cefoTEtan (CEFOTAN) 2 g in dextrose 5 % 50 mL IVPB     2 g 100 mL/hr over 30 Minutes Intravenous Every 12 hours 01/31/15 1552 01/31/15 2136   01/31/15 0742  cefoTEtan (CEFOTAN) 2 g  in dextrose 5 % 50 mL IVPB     2 g 100 mL/hr over 30 Minutes Intravenous On call to O.R. 01/31/15 0742 01/31/15 1100      Assessment/Plan: s/p Procedure(s): LAPAROSCOPIC SIGMOID COLECTOMY SPLENIC FLEXURE MOBILIZATION (N/A) Pull back NGT Mobilize Pain control Await bowel function   LOS: 4 days    Marigene Ehlersamirez Jr., [facility] 02/04/2015

## 2015-02-05 LAB — BASIC METABOLIC PANEL
ANION GAP: 9 (ref 5–15)
BUN: 13 mg/dL (ref 6–20)
CHLORIDE: 110 mmol/L (ref 101–111)
CO2: 25 mmol/L (ref 22–32)
Calcium: 9.3 mg/dL (ref 8.9–10.3)
Creatinine, Ser: 0.91 mg/dL (ref 0.61–1.24)
Glucose, Bld: 140 mg/dL — ABNORMAL HIGH (ref 65–99)
Potassium: 4.1 mmol/L (ref 3.5–5.1)
SODIUM: 144 mmol/L (ref 135–145)

## 2015-02-05 LAB — CBC
HEMATOCRIT: 38.8 % — AB (ref 39.0–52.0)
HEMOGLOBIN: 13.4 g/dL (ref 13.0–17.0)
MCH: 34.1 pg — ABNORMAL HIGH (ref 26.0–34.0)
MCHC: 34.5 g/dL (ref 30.0–36.0)
MCV: 98.7 fL (ref 78.0–100.0)
Platelets: 253 10*3/uL (ref 150–400)
RBC: 3.93 MIL/uL — AB (ref 4.22–5.81)
RDW: 13.2 % (ref 11.5–15.5)
WBC: 9.4 10*3/uL (ref 4.0–10.5)

## 2015-02-05 MED ORDER — METOCLOPRAMIDE HCL 5 MG/ML IJ SOLN
10.0000 mg | Freq: Four times a day (QID) | INTRAMUSCULAR | Status: DC
  Administered 2015-02-05 – 2015-02-06 (×4): 10 mg via INTRAVENOUS
  Filled 2015-02-05 (×8): qty 2

## 2015-02-05 NOTE — Progress Notes (Signed)
   02/05/15 1000  Clinical Encounter Type  Visited With Patient  Visit Type Initial;Psychological support;Spiritual support  Referral From Nurse  Consult/Referral To Chaplain  Spiritual Encounters  Spiritual Needs Emotional;Other (Comment) (Pastoral Conversation)  Stress Factors  Patient Stress Factors None identified   Chaplain visited with the patient per request by his nurse. The nurse stated that the patient had been feeling down and taking his current health circumstances very hard. She believed that he could use spiritual care services for emotional/psychological support [facility]. A nursing student was in the patient's room at the time of the Chaplain's arrival and the patient was holding a tube from his nose. The Chaplain told the patient that she would return at a better time. The patient was receptive to the Chaplain's offer.

## 2015-02-05 NOTE — Progress Notes (Signed)
5 Days Post-Op  Subjective: Doing well.  Mobilzing Bowel fucntion  Objective: Vital signs in last 24 hours: Temp:  [98.1 F (36.7 C)-99.1 F (37.3 C)] 99.1 F (37.3 C) (10/19 1333) Pulse Rate:  [53-60] 56 (10/19 1333) Resp:  [18-19] 18 (10/19 1333) BP: (161-182)/(81-85) 176/85 mmHg (10/19 1333) SpO2:  [96 %-98 %] 98 % (10/19 1333) Last BM Date: 02/06/15  Intake/Output from previous day: 10/18 0701 - 10/19 0700 In: -  Out: 2200 [Emesis/NG output:2200] Intake/Output this shift: Total I/O In: 3500 [I.V.:3500] Out: 150 [Emesis/NG output:150]  General appearance: alert and cooperative GI: soft, non-tender; bowel sounds normal; no masses,  no organomegaly  Lab Results:   Recent Labs  02/05/15 0930  WBC 9.4  HGB 13.4  HCT 38.8*  PLT 253   BMET  Recent Labs  02/03/15 0554 02/05/15 0930  NA 140 144  K 3.6 4.1  CL 105 110  CO2 27 25  GLUCOSE 136* 140*  BUN 9 13  CREATININE 0.95 0.91  CALCIUM 8.6* 9.3     Assessment/Plan: s/p Procedure(s): LAPAROSCOPIC SIGMOID COLECTOMY SPLENIC FLEXURE MOBILIZATION (N/A) D CNGT  Mobilize Fulls in AM if no n/v overnight.  LOS: 5 days    Jonathan Ehlersamirez Jr., Jonathan Graves 02/05/2015

## 2015-02-06 MED ORDER — HYDROCHLOROTHIAZIDE 12.5 MG PO CAPS
12.5000 mg | ORAL_CAPSULE | Freq: Every day | ORAL | Status: DC
  Administered 2015-02-06: 12.5 mg via ORAL
  Filled 2015-02-06 (×2): qty 1

## 2015-02-06 MED ORDER — LISINOPRIL 20 MG PO TABS
20.0000 mg | ORAL_TABLET | Freq: Every day | ORAL | Status: DC
  Administered 2015-02-06: 20 mg via ORAL
  Filled 2015-02-06 (×2): qty 1

## 2015-02-06 NOTE — Progress Notes (Signed)
6 Days Post-Op  Subjective: Pt doing well this AM.  No n/v.  Con't with BMs AMbulating well  Objective: Vital signs in last 24 hours: Temp:  [97.7 F (36.5 C)-99.2 F (37.3 C)] 97.7 F (36.5 C) (10/20 0619) Pulse Rate:  [50-60] 50 (10/20 0619) Resp:  [16-19] 18 (10/20 0619) BP: (161-176)/(76-85) 161/83 mmHg (10/20 0619) SpO2:  [97 %-98 %] 98 % (10/20 0619) Last BM Date: 02/06/15  Intake/Output from previous day: 10/19 0701 - 10/20 0700 In: 3500 [I.V.:3500] Out: 150 [Emesis/NG output:150] Intake/Output this shift:    General appearance: alert and cooperative GI: soft, non-tender; bowel sounds normal; no masses,  no organomegaly  Lab Results:   Recent Labs  02/05/15 0930  WBC 9.4  HGB 13.4  HCT 38.8*  PLT 253   BMET  Recent Labs  02/05/15 0930  NA 144  K 4.1  CL 110  CO2 25  GLUCOSE 140*  BUN 13  CREATININE 0.91  CALCIUM 9.3    Assessment/Plan: s/p Procedure(s): LAPAROSCOPIC SIGMOID COLECTOMY SPLENIC FLEXURE MOBILIZATION (N/A) Advance diet to FLD Mobilize Home in next 1-2d  LOS: 6 days    Marigene Ehlersamirez Jr., [facility] 02/06/2015

## 2015-02-06 NOTE — Progress Notes (Signed)
Dr. Ezzard StandingNewman aware on rounds to unit pt's bp elevated upon last check. Pt reported he took bp med at home. MD aware Dr. Derrell Lollingamirez advanced pt's diet today and pt tolerating well. See new order received to resume home BP med.

## 2015-02-07 MED ORDER — OXYCODONE-ACETAMINOPHEN 5-325 MG PO TABS: 1.0000 | ORAL_TABLET | ORAL | Status: DC | PRN

## 2015-02-07 NOTE — Discharge Summary (Signed)
Physician Discharge Summary  Patient ID: Jonathan Graves MRN: [id number] DOB/AGE: 1960-07-17 54 y.o.  Admit date: 01/31/2015 Discharge date: 02/07/2015  Admission Diagnoses: Status post sigmoid colon resection Discharge Diagnoses:  Active Problems:   S/P laparoscopic-assisted sigmoidectomy   Discharged Condition: good  Hospital Course: Postoperatively patient was sent to the floor. Patient had several day history of ileus. NG tube was in place with large output. Patient began having bowel function. Secondary to this NG tube was removed. Patient tolerated this well. He was started on a liquid diet and slowly advanced to regular diet which she tolerated well. Patient had good pain control. He was ambulating well on his own. The patient was otherwise afebrile, deemed stable for discharge and discharged home.  Consults: None  Significant Diagnostic Studies: none  Treatments: surgery: As above  Discharge Exam: Blood pressure 137/89, pulse 53, temperature 98.7 F (37.1 C), temperature source Oral, resp. rate 17, height 6' (1.829 m), weight 107.673 kg (237 lb 6 oz), SpO2 96 %. General appearance: alert and cooperative GI: Incisions clean dry and intact, appropriate tenderness to palpation  Disposition: 01-Home or Self Care  Discharge Instructions    Diet - low sodium heart healthy    Complete by:  As directed      Increase activity slowly    Complete by:  As directed             Medication List    TAKE these medications        aspirin EC 81 MG tablet  Take 81 mg by mouth every morning.     ciprofloxacin 500 MG tablet  Commonly known as:  CIPRO  Take 1 tablet (500 mg total) by mouth 2 (two) times daily. One po bid x 10 days     FIBER PO  Take 1 tablet by mouth every morning.     ibuprofen 200 MG tablet  Commonly known as:  ADVIL,MOTRIN  Take 200 mg by mouth every 6 (six) hours as needed for moderate pain.     lisinopril-hydrochlorothiazide 20-12.5 MG tablet   Commonly known as:  PRINZIDE,ZESTORETIC  Take 1 tablet by mouth every morning.     metroNIDAZOLE 500 MG tablet  Commonly known as:  FLAGYL  Take 1 tablet (500 mg total) by mouth 3 (three) times daily. One po bid x 10 days     naproxen 500 MG tablet  Commonly known as:  NAPROSYN  Take 1 tablet (500 mg total) by mouth 2 (two) times daily.     neomycin 500 MG tablet  Commonly known as:  MYCIFRADIN  TAKE 2 (TWO) TABLET2, ORALLY, AT 2 PM , 3 PM, AND 10 PM DAY PRIO TO SURGERY     oxyCODONE-acetaminophen 5-325 MG tablet  Commonly known as:  PERCOCET  Take 1 tablet by mouth every 4 (four) hours as needed for moderate pain.     oxyCODONE-acetaminophen 5-325 MG tablet  Commonly known as:  ROXICET  Take 1-2 tablets by mouth every 4 (four) hours as needed.     vitamin B-12 1000 MCG tablet  Commonly known as:  CYANOCOBALAMIN  Take 1,000 mcg by mouth every morning.           Follow-up Information    Follow up with Lajean Saver, MD In 2 weeks.   Specialty:  General Surgery   Why:  For wound re-check   Contact information:   [address] [place] [address] [phone]       Signed:  Marigene Ehlersamirez Jr., Ayleah Hofmeister 02/07/2015, 9:06 AM

## 2015-04-20 DIAGNOSIS — R7303 Prediabetes: Secondary | ICD-10-CM

## 2015-04-20 DIAGNOSIS — N182 Chronic kidney disease, stage 2 (mild): Secondary | ICD-10-CM

## 2015-04-20 DIAGNOSIS — R7401 Elevation of levels of liver transaminase levels: Secondary | ICD-10-CM

## 2015-04-20 DIAGNOSIS — N2889 Other specified disorders of kidney and ureter: Secondary | ICD-10-CM

## 2015-04-20 HISTORY — DX: Other specified disorders of kidney and ureter: N28.89

## 2015-04-20 HISTORY — DX: Elevation of levels of liver transaminase levels: R74.01

## 2015-04-20 HISTORY — DX: Prediabetes: R73.03

## 2015-04-20 HISTORY — DX: Chronic kidney disease, stage 2 (mild): N18.2

## 2015-11-13 LAB — BASIC METABOLIC PANEL
BUN: 17 (ref 4–21)
CREATININE: 1.2 (ref 0.6–1.3)
Glucose: 131
POTASSIUM: 4.3 (ref 3.4–5.3)
SODIUM: 139 (ref 137–147)

## 2015-11-13 LAB — HEPATIC FUNCTION PANEL
ALK PHOS: 33 (ref 25–125)
ALT: 71 — AB (ref 10–40)
AST: 41 — AB (ref 14–40)
BILIRUBIN, TOTAL: 0.8

## 2015-11-13 LAB — LIPID PANEL
Cholesterol: 258 — AB (ref 0–200)
HDL: 44 (ref 35–70)
LDL Cholesterol: 161
TRIGLYCERIDES: 265 — AB (ref 40–160)

## 2015-11-18 ENCOUNTER — Other Ambulatory Visit (HOSPITAL_BASED_OUTPATIENT_CLINIC_OR_DEPARTMENT_OTHER): Payer: Self-pay | Admitting: Physician Assistant

## 2015-11-18 DIAGNOSIS — R748 Abnormal levels of other serum enzymes: Secondary | ICD-10-CM

## 2015-11-24 ENCOUNTER — Other Ambulatory Visit (HOSPITAL_BASED_OUTPATIENT_CLINIC_OR_DEPARTMENT_OTHER): Payer: Self-pay | Admitting: Physician Assistant

## 2015-11-26 ENCOUNTER — Ambulatory Visit (HOSPITAL_BASED_OUTPATIENT_CLINIC_OR_DEPARTMENT_OTHER)
Admission: RE | Admit: 2015-11-26 | Discharge: 2015-11-26 | Disposition: A | Discharge status: Home / Self Care | Payer: Managed Care, Other (non HMO) | Source: Ambulatory Visit | Attending: Physician Assistant | Admitting: Physician Assistant

## 2015-11-26 DIAGNOSIS — R748 Abnormal levels of other serum enzymes: Secondary | ICD-10-CM | POA: Insufficient documentation

## 2015-11-26 DIAGNOSIS — R932 Abnormal findings on diagnostic imaging of liver and biliary tract: Secondary | ICD-10-CM | POA: Diagnosis not present

## 2016-10-01 IMAGING — CT CT ABD-PELV W/O CM
2 of 4 series · 4 of 46 positions shown, 5 images · non-contrast
Comparison: CT abdomen pelvis - 08/16/2014; CT-guided left lower
quadrant abscess drainage catheter placement - 08/18/2014

CLINICAL DATA: Left lower quadrant abscess drain follow-up

EXAM:
CT ABDOMEN AND PELVIS WITHOUT CONTRAST
TECHNIQUE: Multidetector CT imaging of the abdomen and pelvis was performed
following the standard protocol without IV contrast.

[Series 204: cor · coronal · 0.50mm/px · 3 of 94 slices shown, 4 images]
[im 21/94  soft-tissue]
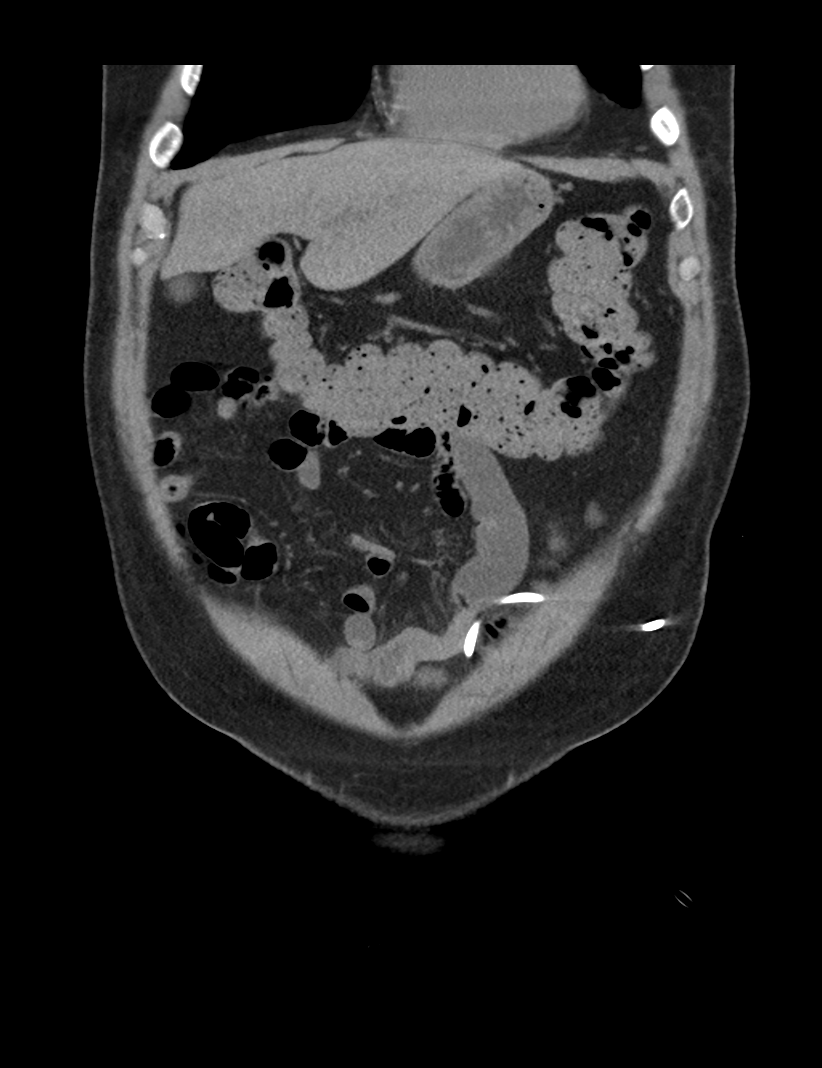
[im 21/94  bone]
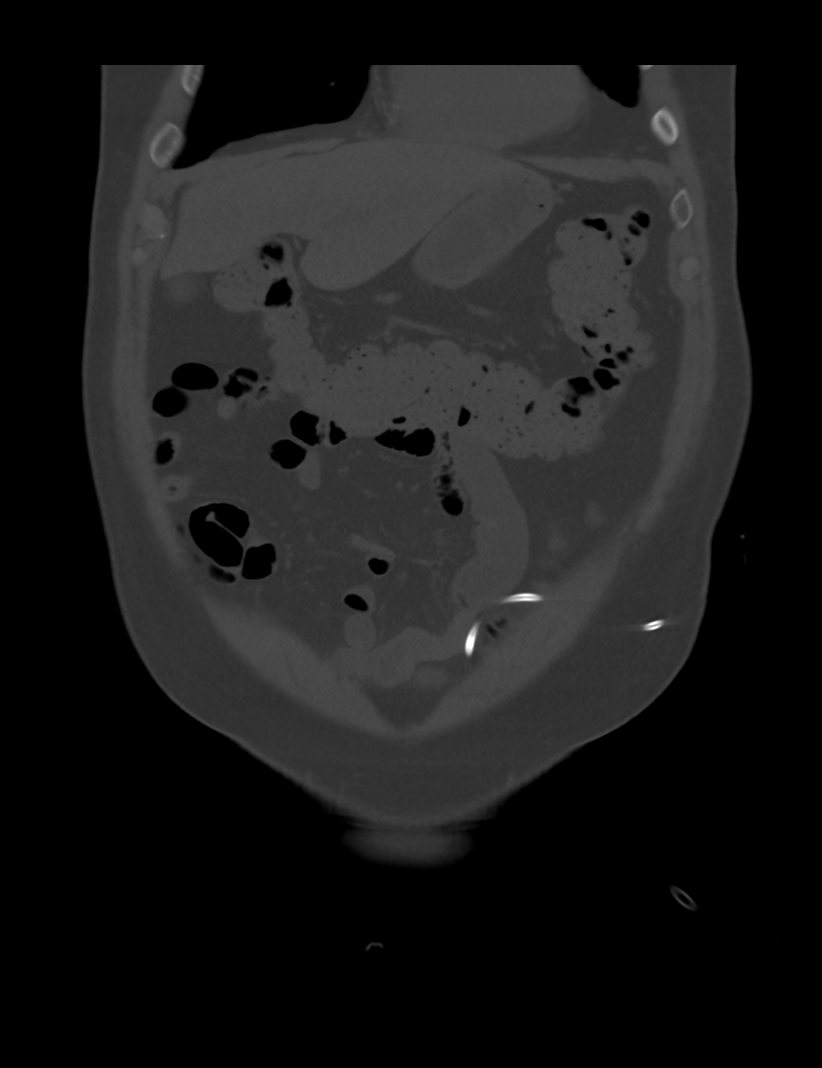
[im 52/94  soft-tissue]
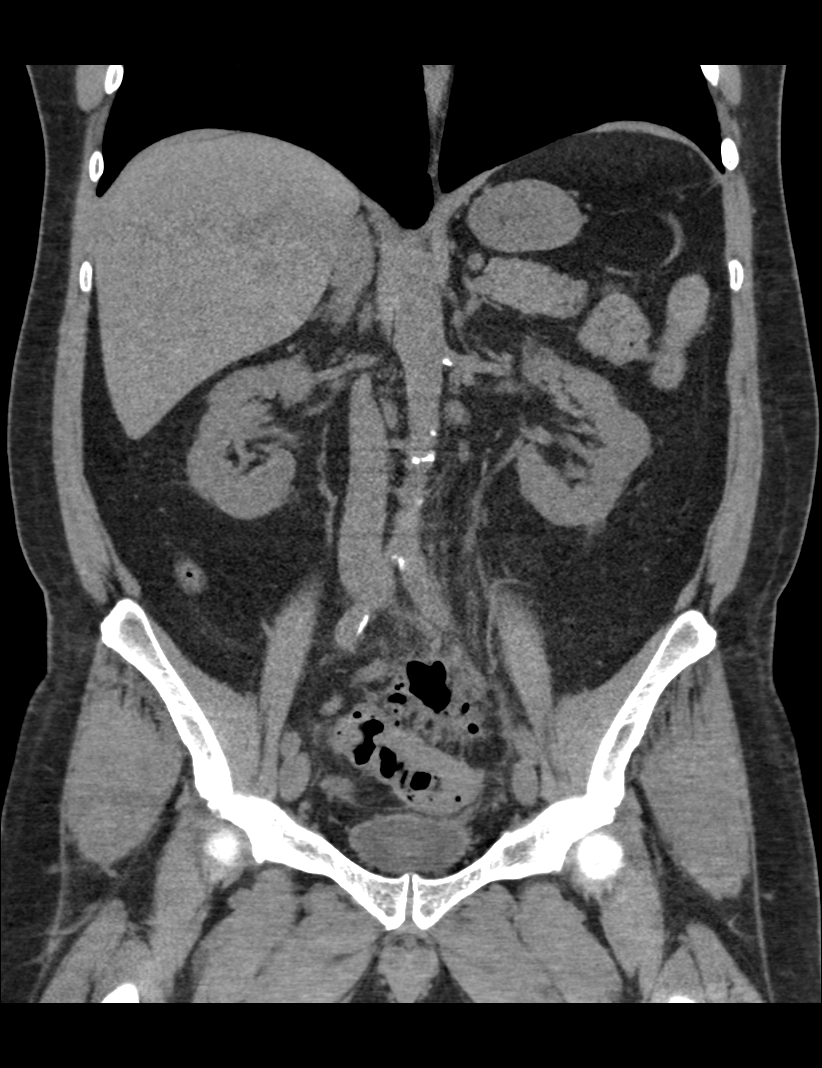
[im 73/94  soft-tissue]
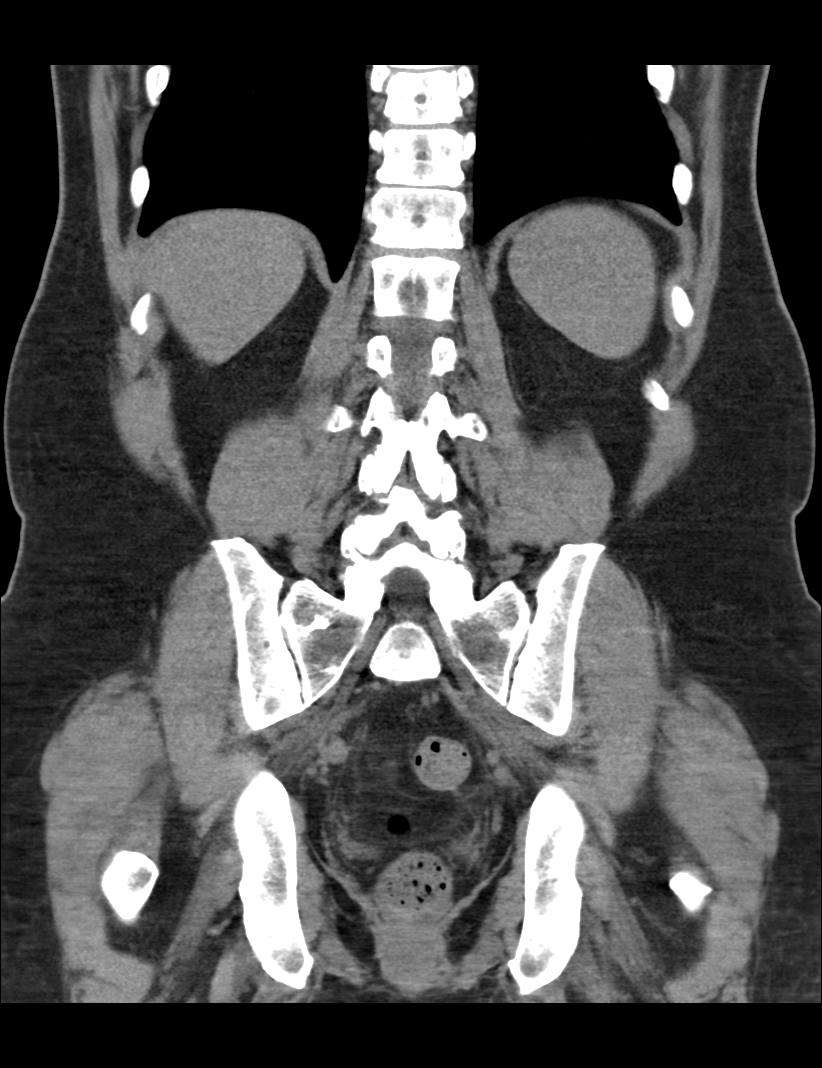

[Series 205: sag · sagittal · 0.50mm/px · 1 of 117 slices shown]
[im 39/117  soft-tissue]
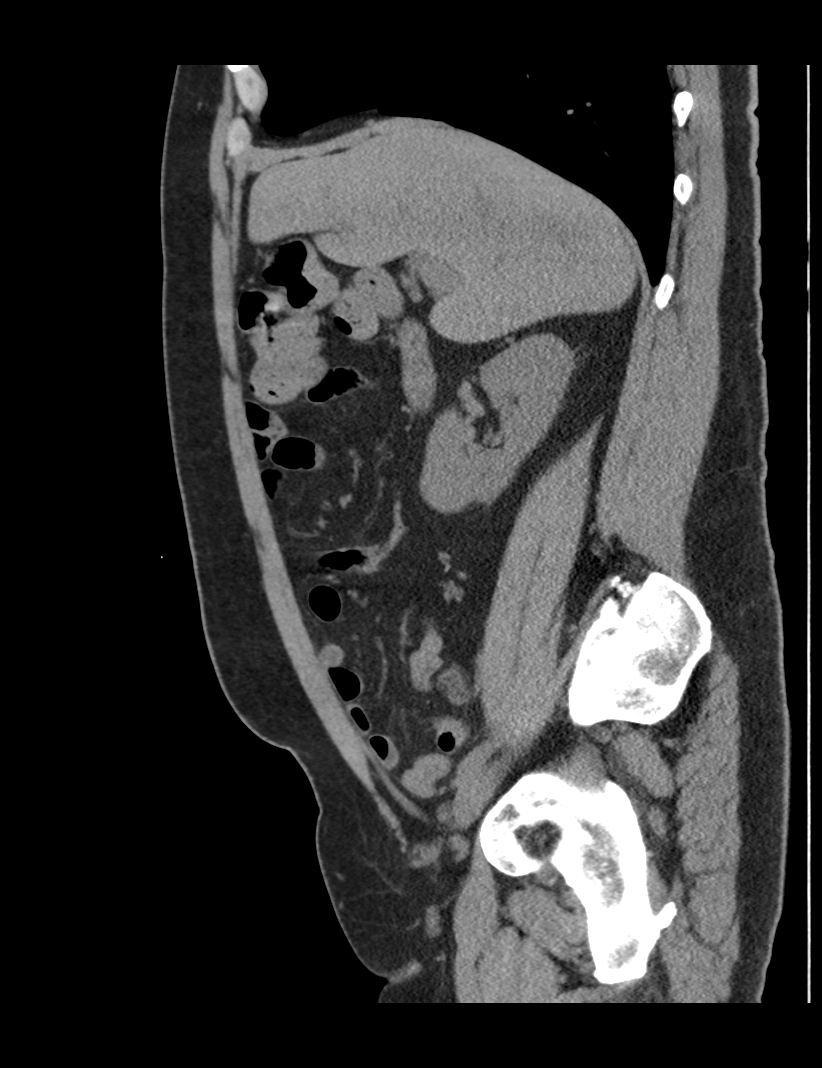

[4 of 46 positions shown; findings below may reference images not displayed]

FINDINGS: The lack of intravenous contrast limits the ability to evaluate
solid abdominal organs.

Mark reduction in previously noted complex diverticular abscess
within the left lower abdomen/upper pelvis post CT-guided
percutaneous drainage catheter placement. There has been interval
track retraction of the percutaneous drain with radiopaque marker
now projecting external to the residual air and fluid collection
located anterior to the sigmoid colon. The dominant air and fluid
collection has decreased in size in interval, currently measuring
approximately 3.4 x 3.1 x 3.4 cm (as measured in greatest oblique
axial and craniocaudal dimensions respectively- image 63, series
201, image 34, series 204), previously, 5.6 x 4.4 x 5.5 cm.

There is a residual, undrained complex air and fluid collection
posterior to the adjacent loop of sigmoid colon which now measures
at least 4.5 x 3.6 x 4.7 cm (image 63, series 201; image 53, series
204), previously, 3.9 x 5.0 x 5.1 cm. There is apparent direct
communication with this posteriorly located complex air and fluid
collection in the adjacent sigmoid colon (representative axial
images 66 through 70, series 201).

Unchanged expected wall thickening involving the adjacent sigmoid
colon within the left lower abdomen/upper pelvis. No new
definable/drainable fluid collections. Moderate colonic stool burden
without evidence of enteric obstruction. No pneumoperitoneum,
pneumatosis or portal venous gas. Normal noncontrast appearance of
the appendix.

Normal hepatic contour. Normal appearance of the gallbladder given
degree distention. No radiopaque gallstones. No ascites.

Punctate (approximately 2 mm) nonobstructing stone within the
superior pole the left kidney (image 32, series 201). No definite
right-sided renal stones. No stones are seen along the expected
course of either ureter or the urinary bladder. Normal noncontrast
appearance of the urinary bladder given degree distention. There is
a minimal amount of likely age related bilateral perinephric
stranding. No urinary obstruction. Normal noncontrast appearance of
the bilateral adrenal glands, pancreas and spleen.

Scattered atherosclerotic plaque within a normal caliber abdominal
aorta.

Unchanged scattered shotty retroperitoneal and mesenteric lymph
nodes, individually not enlarged by size criteria with index
periaortic lymph node measuring 0.6 cm in greatest short axis
diameter (image 37, series 201) and index mesenteric lymph node
within the left lower abdominal quadrant measuring 0.9 cm (image 62,
series 201), presumably reactive in etiology. No definitive bulky
retroperitoneal, mesenteric, pelvic or inguinal lymphadenopathy on
this noncontrast examination.

Scattered calcifications within a normal sized prostate gland. No
free fluid in the pelvic cul-de-sac.

Limited visualization of lower thorax demonstrates minimal
subsegmental atelectasis within the left costophrenic angle. No
discrete focal airspace opacities. No pleural effusion

Normal heart size. Coronary artery calcifications. No pericardial
effusion.

No acute or aggressive osseous abnormalities. Mild-to-moderate
multilevel lumbar spine DDD. Bilateral facet degenerative change,
worse within the lower lumbar spine.

Punctate (approximately 1 cm) nodule within the subcutaneous tissues
of the left mid hemi abdomen, potentially representative of a
sebaceous cyst (image 43, series 201). Regional soft tissues appear
otherwise normal.
IMPRESSION: 1. Interval reduction in size of percutaneously drained diverticular
abscess (currently measuring 3.4 cm in maximal diameter, previously,
5.5 cm in diameter) anterior to the sigmoid colon. While the
percutaneous drain has been slightly retracted, the distal end of
the coiled remains contained within the residual air and fluid
collection.
2. Minimal reduction in undrained complex air and fluid collection
posterior to the sigmoid colon, currently measuring 4.5 cm in
diameter, previously, 5.0 cm. This additional complex fluid
collection appears to maintain a direct fistulous connection to the
adjacent sigmoid colon.
3. No new definable/drainable fluid collections.
Critical Value/emergent results were relayed at the time of
interpretation on 08/27/2014 at [DATE] to Dr. ADARSH TIGER.

## 2016-10-15 IMAGING — CT CT ABD-PELV W/O CM
2 of 4 series · 16 of 46 positions shown, 18 images · non-contrast
Comparison: Prior CT scan 08/27/2014

CLINICAL DATA: 53-year-old male with left lower quadrant
diverticular abscess initially drained percutaneously on 08/18/2014.
Subsequent follow-up imaging on 08/27/2014 demonstrated persistent
abscess both at the drain site, and posterior to the sigmoid colon.
An additional 2 week course of antibiotics was prescribed. Today,
patient is asymptomatic and presents for follow-up evaluation.

EXAM:
CT ABDOMEN AND PELVIS WITHOUT CONTRAST
TECHNIQUE: Multidetector CT imaging of the abdomen and pelvis was performed
following the standard protocol without IV contrast.

[Series 2: abd/ pelvis 5.0 i30f 1 · axial · 0.88mm/px · z∈[+857,+1297]mm · 13 of 97 slices shown, 15 images]
[im 5/97  soft-tissue]
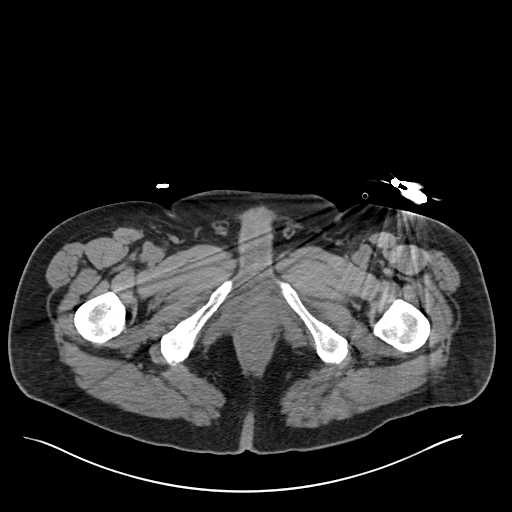
[im 5/97  bone]
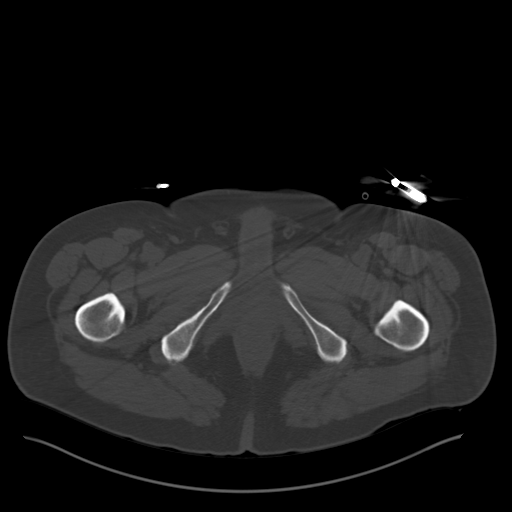
[im 13/97  soft-tissue]
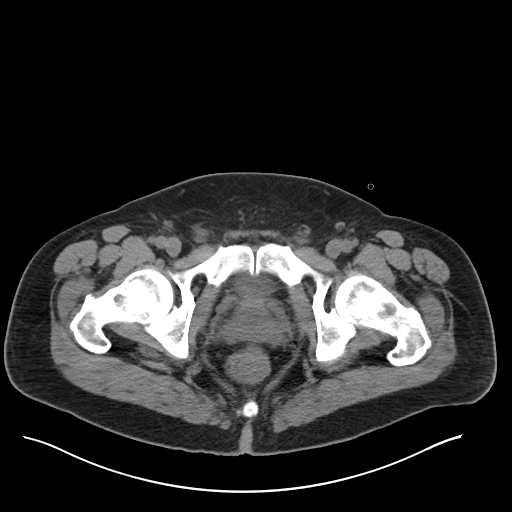
[im 21/97  soft-tissue]
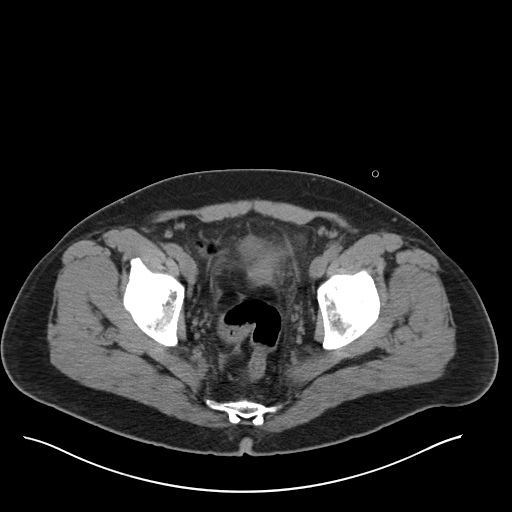
[im 29/97  soft-tissue]
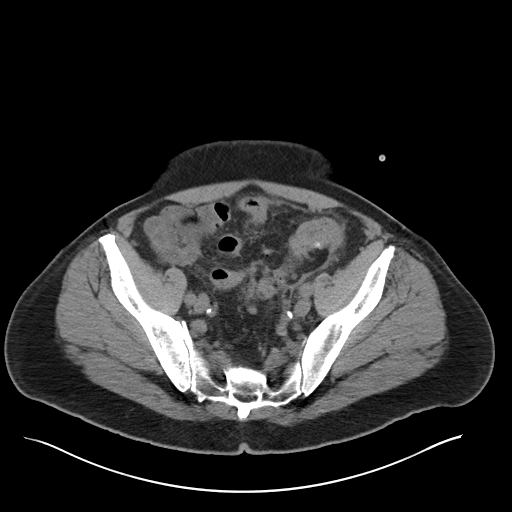
[im 33/97  soft-tissue]
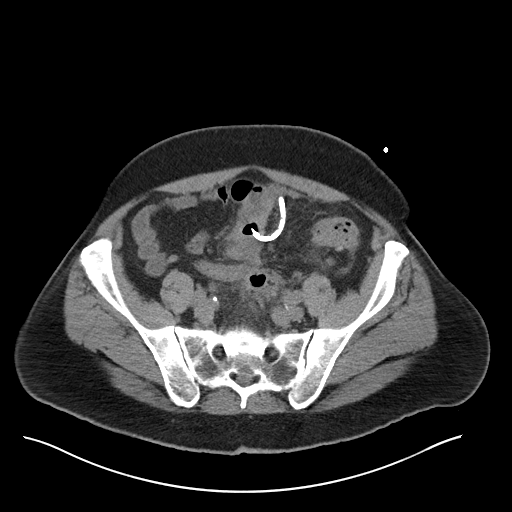
[im 41/97  soft-tissue]
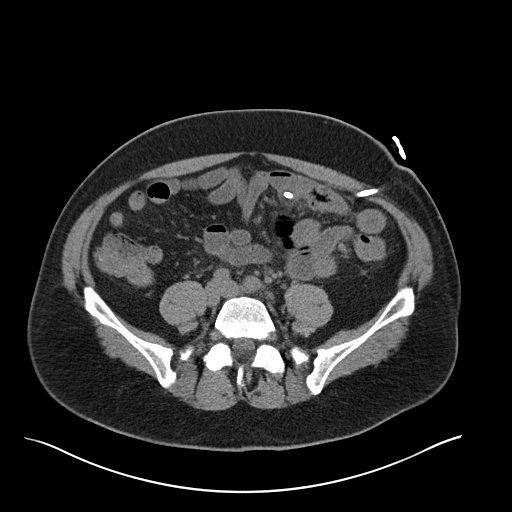
[im 49/97  soft-tissue]
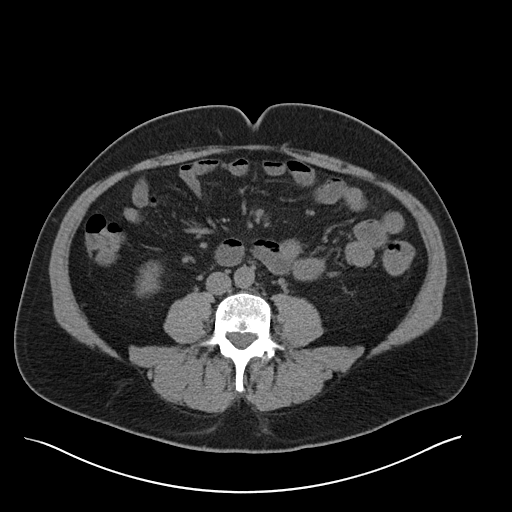
[im 57/97  soft-tissue]
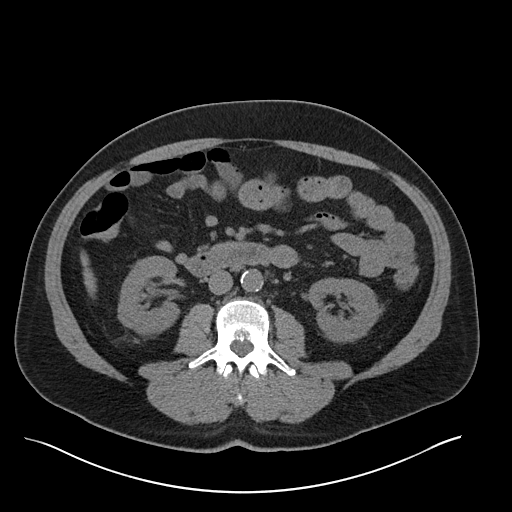
[im 65/97  soft-tissue]
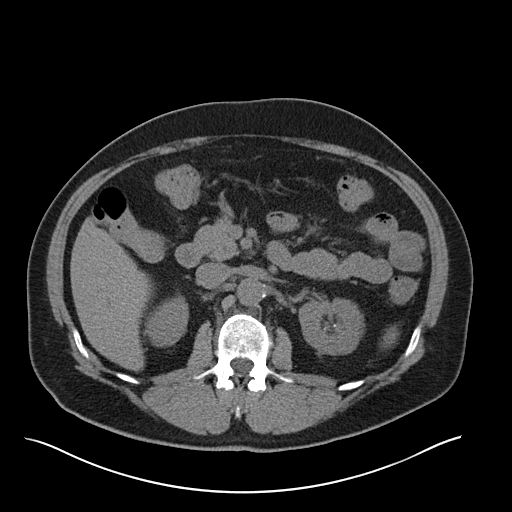
[im 65/97  bone]
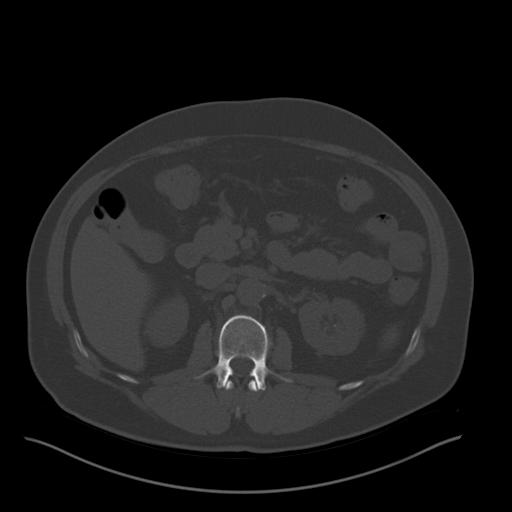
[im 69/97  soft-tissue]
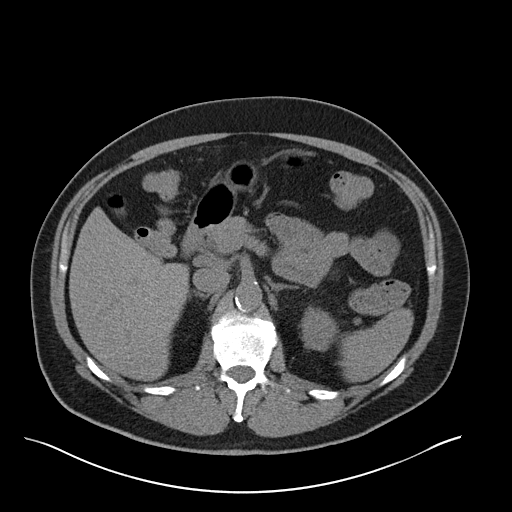
[im 77/97  soft-tissue]
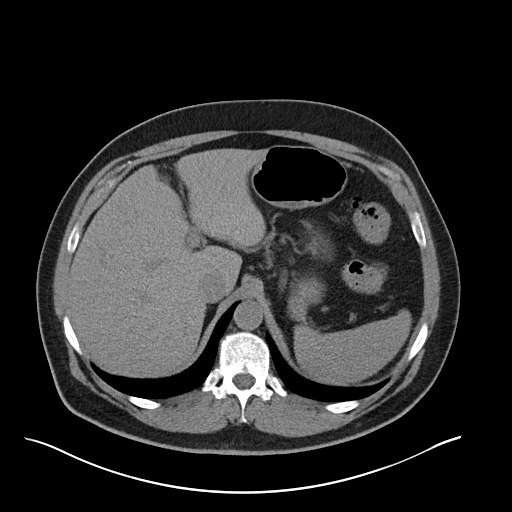
[im 85/97  soft-tissue]
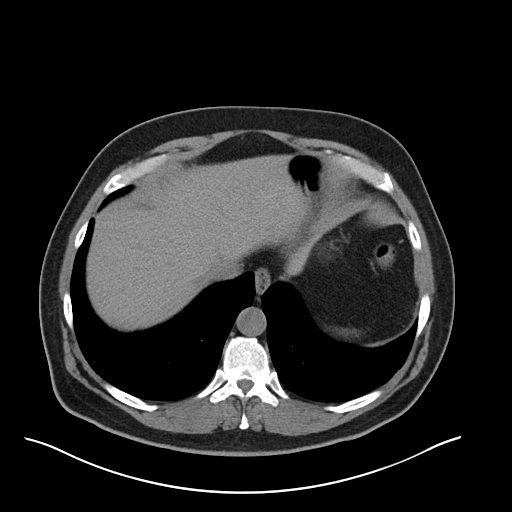
[im 93/97  soft-tissue]
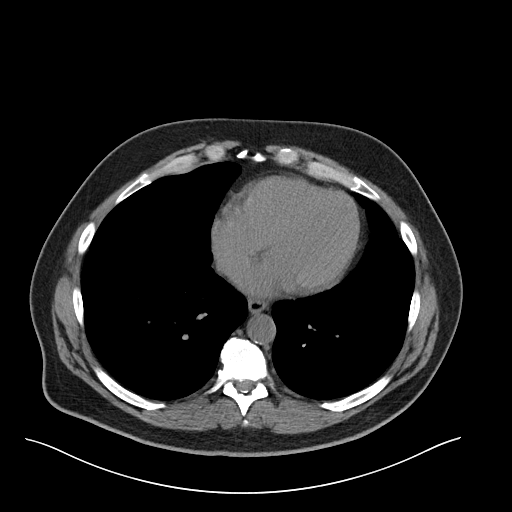

[Series 5: cor st · coronal · 0.96mm/px · 3 of 87 slices shown]
[im 29/87  soft-tissue]
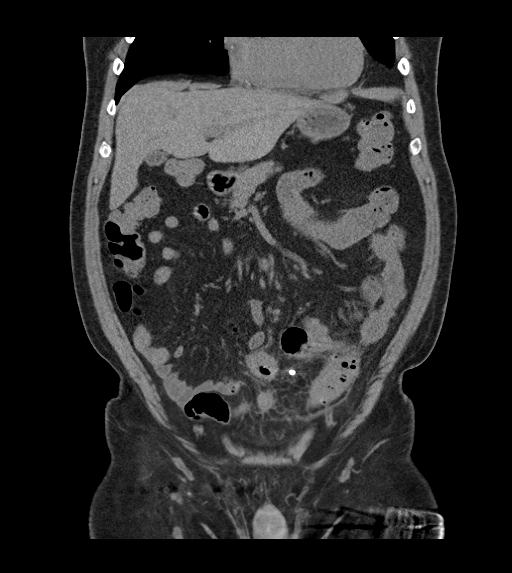
[im 39/87  soft-tissue]
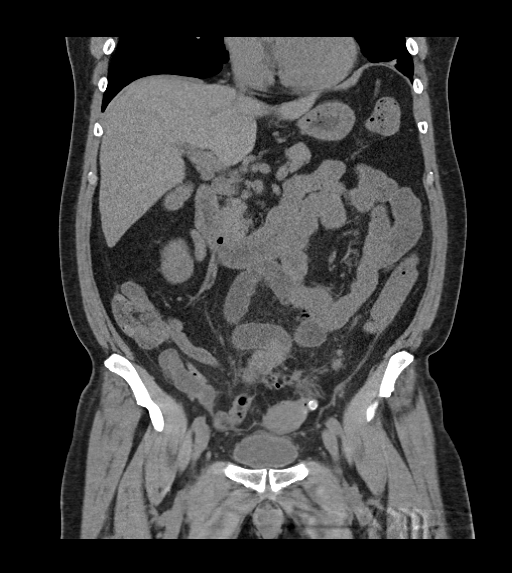
[im 48/87  soft-tissue]
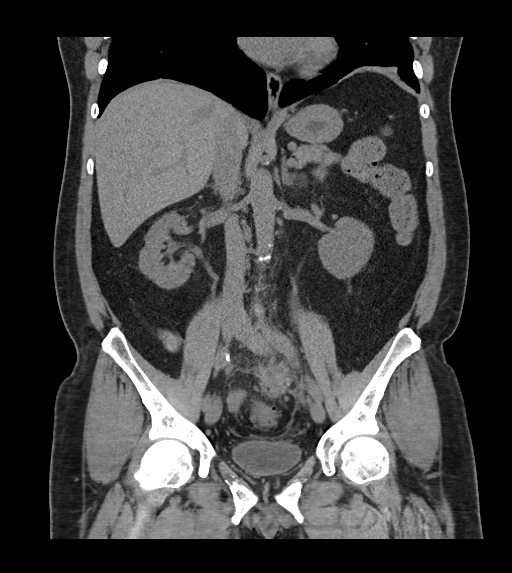

[16 of 46 positions shown; findings below may reference images not displayed]

FINDINGS: Lower Chest: The lung bases are clear. Visualized cardiac structures
are within normal limits for size. No pericardial effusion.
Unremarkable visualized distal thoracic esophagus.

Abdomen: Unenhanced CT was performed per clinician order. Lack of IV
contrast limits sensitivity and specificity, especially for
evaluation of abdominal/pelvic solid viscera. Within these
limitations, unremarkable CT appearance of the stomach, duodenum,
spleen, adrenal glands and pancreas. Normal hepatic contour and
morphology. No discrete hepatic lesion. Gallbladder is unremarkable.
No intra or extrahepatic biliary ductal dilatation.

Punctate nonobstructing nephrolithiasis in the upper pole collecting
system of the left kidney again noted. No hydronephrosis or focal
renal lesion.

No evidence of bowel obstruction. Mild residual inflammatory
stranding in the pericolonic fat surrounding the sigmoid colon with
extensive diverticulitis. Left lower quadrant approach percutaneous
drainage catheter remains in unchanged position with the tip in a
very small gas collection in the anterior abdomen. This collection
presently measures 1.8 x 1.8 cm compared to 3.4 x 3.0 cm previously.
A second gas collection noted posterior to the inflamed segment of
colon has also significantly decreased in size presently measuring
2.2 x 1.9 x 2.0 cm compared to 3.6 x 4.5 x 4.7 cm. Again, the
collection contains only gas. No focal fluid is present.

Pelvis: Dystrophic calcifications in the prostate gland. No free
fluid or suspicious adenopathy.

Bones/Soft Tissues: No acute fracture or aggressive appearing lytic
or blastic osseous lesion.

Vascular: Limited evaluation in the absence of intravenous contrast.
Trace atherosclerotic vascular calcifications.
IMPRESSION: 1. Significantly decreased small gas collections anterior and
posterior to the inflamed segment of sigmoid colon. The percutaneous
drainage catheter remains in unchanged position with the tip in the
more anterior of the 2 collections.
2. No new abscess or focal abnormality.

## 2017-04-01 ENCOUNTER — Telehealth: Payer: Self-pay | Admitting: *Deleted

## 2017-04-01 ENCOUNTER — Encounter: Payer: Self-pay | Admitting: Family Medicine

## 2017-04-01 ENCOUNTER — Encounter: Payer: Self-pay | Admitting: *Deleted

## 2017-04-01 NOTE — Telephone Encounter (Signed)
New pt apt 04/26/17  [facility].  I reviewed records and abstracted information into pts chart.   Records have been placed on Dr. Samul DadaMcGowen's desk for review.

## 2017-04-25 ENCOUNTER — Encounter: Payer: Self-pay | Admitting: Family Medicine

## 2017-04-25 NOTE — Progress Notes (Signed)
Office Note 04/26/2017  CC:  Chief Complaint  Patient presents with  . Establish Care    HPI:  Jonathan Graves is a 57 y.o. male who is here to establish care and discuss bp management. Patient's most recent primary MD: Dr. Sherwood Gambler at [facility] in [place]. Old records were reviewed prior to or during today's visit.  Has run out of bp med x 2 mo, didn't want to continue with prior PCP office, so he waited until he established here to restart med. Has checked bp at home since he's been off med and it has been 150s/90s. No bp monitoring was being done while he was on bp med.  He voices desire to return and get a full CPE with fasting lab panel.  Past Medical History:  Diagnosis Date  . Chronic renal insufficiency, stage 3 (moderate) (HCC) 2017   GFR 60  . Diverticulitis   . Elevated transaminase level 2017   Mild: fatty liver on u/s 11/2015  . Granuloma annulare 02/20/2008   Dr. Everlene Farrier  . Hyperlipemia, mixed    TLC recommended 2017  . Hypertension   . IFG (impaired fasting glucose) 2017   gluc 131  . Obesity, Class I, BMI 30-34.9     Past Surgical History:  Procedure Laterality Date  . BICEPS TENDON REPAIR Right    2'16 -Dr. Amanda Pea  . COLONOSCOPY  approx 2010   normal.  Recall 10 yrs.  Marland Kitchen HAMMER TOE SURGERY  2015  . LAPAROSCOPIC SIGMOID COLECTOMY N/A 01/31/2015   For diverticulitis.  Procedure: LAPAROSCOPIC SIGMOID COLECTOMY SPLENIC FLEXURE MOBILIZATION;  Surgeon: Axel Filler, MD;  Location: [facility];  Service: General;  Laterality: N/A;  . LIPOMA RESECTION  2009   Dr. Stephens November  . MOUTH SURGERY    . SMALL INTESTINE SURGERY  01/2015   Small bowel anastomosis developed after sigmoid colectomy --repair required.  Marland Kitchen VASECTOMY      Family History  Problem Relation Age of Onset  . Gastric cancer Mother   . Breast cancer Mother   . Hypertension Father   . Diabetes Father   . Heart disease Father   . Early death Maternal Grandmother        MVA  .  Early death Maternal Grandfather        MVA  . CVA Paternal Grandmother   . Alzheimer's disease Paternal Grandfather   . Colon cancer Neg Hx   . Liver disease Neg Hx     Social History   Socioeconomic History  . Marital status: Married    Spouse name: Not on file  . Number of children: Not on file  . Years of education: Not on file  . Highest education level: Not on file  Social Needs  . Financial resource strain: Not on file  . Food insecurity - worry: Not on file  . Food insecurity - inability: Not on file  . Transportation needs - medical: Not on file  . Transportation needs - non-medical: Not on file  Occupational History  . Not on file  Tobacco Use  . Smoking status: Former Smoker    Packs/day: 0.25    Types: Cigarettes  . Smokeless tobacco: Never Used  Substance and Sexual Activity  . Alcohol use: Yes    Alcohol/week: 4.8 oz    Types: 2 Glasses of wine, 6 Cans of beer per week    Comment: daily  . Drug use: No  . Sexual activity: Not on file  Other Topics Concern  .  Not on file  Social History Narrative   Married, 3 daughters (one set of twin daughters), 1 son--all are grown and out of the house.   Educ: colleg grad--BS.   Occup: business Community education officer.   Tob: no signif regular smoking.   Alc: 2 drinks per week   Drugs: none   No hx of colon or prostate cancer.    Outpatient Encounter Medications as of 04/26/2017  Medication Sig  . aspirin EC 81 MG tablet Take 81 mg by mouth every morning.   Marland Kitchen FIBER PO Take 1 tablet by mouth every morning.   . vitamin B-12 (CYANOCOBALAMIN) 1000 MCG tablet Take 1,000 mcg by mouth every morning.   Marland Kitchen ibuprofen (ADVIL,MOTRIN) 200 MG tablet Take 200 mg by mouth every 6 (six) hours as needed for moderate pain.  Marland Kitchen lisinopril-hydrochlorothiazide (PRINZIDE,ZESTORETIC) 20-12.5 MG tablet Take 1 tablet by mouth every morning.  . [DISCONTINUED] ciprofloxacin (CIPRO) 500 MG tablet Take 1 tablet (500 mg total) by mouth 2 (two) times  daily. One po bid x 10 days (Patient not taking: Reported on 01/13/2015)  . [DISCONTINUED] lisinopril-hydrochlorothiazide (PRINZIDE,ZESTORETIC) 20-12.5 MG per tablet Take 1 tablet by mouth every morning.   . [DISCONTINUED] metroNIDAZOLE (FLAGYL) 500 MG tablet Take 1 tablet (500 mg total) by mouth 3 (three) times daily. One po bid x 10 days (Patient not taking: Reported on 01/13/2015)  . [DISCONTINUED] naproxen (NAPROSYN) 500 MG tablet Take 1 tablet (500 mg total) by mouth 2 (two) times daily. (Patient not taking: Reported on 01/13/2015)  . [DISCONTINUED] neomycin (MYCIFRADIN) 500 MG tablet TAKE 2 (TWO) TABLET2, ORALLY, AT 2 PM , 3 PM, AND 10 PM DAY PRIO TO SURGERY  . [DISCONTINUED] oxyCODONE-acetaminophen (PERCOCET) 5-325 MG per tablet Take 1 tablet by mouth every 4 (four) hours as needed for moderate pain. (Patient not taking: Reported on 01/13/2015)  . [DISCONTINUED] oxyCODONE-acetaminophen (ROXICET) 5-325 MG tablet Take 1-2 tablets by mouth every 4 (four) hours as needed. (Patient not taking: Reported on 04/26/2017)   No facility-administered encounter medications on file as of 04/26/2017.     No Known Allergies  ROS Review of Systems  Constitutional: Negative for fatigue and fever.  HENT: Negative for congestion and sore throat.   Eyes: Negative for visual disturbance.  Respiratory: Negative for cough.   Cardiovascular: Negative for chest pain.  Gastrointestinal: Negative for abdominal pain and nausea.  Genitourinary: Negative for dysuria.  Musculoskeletal: Negative for back pain and joint swelling.  Skin: Negative for rash.  Neurological: Negative for weakness and headaches.  Hematological: Negative for adenopathy.    PE; Blood pressure (!) 164/108, pulse 73, temperature 98.1 F (36.7 C), temperature source Oral, resp. rate 16, height 6' (1.829 m), weight 242 lb (109.8 kg), SpO2 96 %. Body mass index is 32.82 kg/m.  Gen: Alert, well appearing.  Patient is oriented to person, place,  time, and situation. AFFECT: pleasant, lucid thought and speech. CV: RRR, no m/r/g.   LUNGS: CTA bilat, nonlabored resps, good aeration in all lung fields. ABD: soft, NT, ND EXT: no clubbing, cyanosis, or edema.  SKIN: no rash, pallor, or jaundice.  Pertinent labs:  No results found for: TSH Lab Results  Component Value Date   WBC 9.4 02/05/2015   HGB 13.4 02/05/2015   HCT 38.8 (L) 02/05/2015   MCV 98.7 02/05/2015   PLT 253 02/05/2015   Lab Results  Component Value Date   CREATININE 1.2 11/13/2015   BUN 17 11/13/2015   NA 139 11/13/2015   K  4.3 11/13/2015   CL 110 02/05/2015   CO2 25 02/05/2015   Lab Results  Component Value Date   ALT 71 (A) 11/13/2015   AST 41 (A) 11/13/2015   ALKPHOS 33 11/13/2015   BILITOT 0.4 08/17/2014   Lab Results  Component Value Date   CHOL 258 (A) 11/13/2015   Lab Results  Component Value Date   HDL 44 11/13/2015   Lab Results  Component Value Date   LDLCALC 161 11/13/2015   Lab Results  Component Value Date   TRIG 265 (A) 11/13/2015   No results found for: CHOLHDL No results found for: PSA No results found for: HGBA1C  ASSESSMENT AND PLAN:   New pt; prior PCP records reviewed.  1) HTN: uncontrolled due to med noncompliance. Restart lisin/hctz 20-12.5, 1 qd.  Hx of mild hyperlipidemia, IFG, fatty liver, and CRI III. Will do labs to f/u on these issues when he returns for his CPE.  An After Visit Summary was printed and given to the patient.  Return in about 2 weeks (around 05/10/2017) for annual CPE (fasting). at f/u HTN.  Signed:  Santiago BumpersPhil Clara Herbison, MD           04/26/2017

## 2017-04-26 ENCOUNTER — Encounter: Payer: Self-pay | Admitting: Family Medicine

## 2017-04-26 ENCOUNTER — Ambulatory Visit (INDEPENDENT_AMBULATORY_CARE_PROVIDER_SITE_OTHER): Payer: Managed Care, Other (non HMO) | Admitting: Family Medicine

## 2017-04-26 VITALS — BP 164/108 | HR 73 | Temp 98.1°F | Resp 16 | Ht 72.0 in | Wt 242.0 lb

## 2017-04-26 DIAGNOSIS — R7301 Impaired fasting glucose: Secondary | ICD-10-CM | POA: Diagnosis not present

## 2017-04-26 DIAGNOSIS — Z0000 Encounter for general adult medical examination without abnormal findings: Secondary | ICD-10-CM

## 2017-04-26 DIAGNOSIS — Z23 Encounter for immunization: Secondary | ICD-10-CM | POA: Diagnosis not present

## 2017-04-26 DIAGNOSIS — E7800 Pure hypercholesterolemia, unspecified: Secondary | ICD-10-CM

## 2017-04-26 DIAGNOSIS — N183 Chronic kidney disease, stage 3 unspecified: Secondary | ICD-10-CM

## 2017-04-26 DIAGNOSIS — I10 Essential (primary) hypertension: Secondary | ICD-10-CM

## 2017-04-26 DIAGNOSIS — Z125 Encounter for screening for malignant neoplasm of prostate: Secondary | ICD-10-CM | POA: Diagnosis not present

## 2017-04-26 MED ORDER — LISINOPRIL-HYDROCHLOROTHIAZIDE 20-12.5 MG PO TABS: 1.0000 | ORAL_TABLET | Freq: Every morning | ORAL | 0 refills | Status: DC

## 2017-04-26 NOTE — Patient Instructions (Signed)
Check your blood pressure and heart rate at home daily for the next 2 weeks and write these numbers down. Bring them in for review with me at your next office visit in 2 weeks.

## 2017-05-05 ENCOUNTER — Other Ambulatory Visit: Payer: Self-pay

## 2017-05-16 ENCOUNTER — Encounter: Payer: Self-pay | Admitting: Family Medicine

## 2017-05-16 ENCOUNTER — Telehealth: Payer: Self-pay | Admitting: Family Medicine

## 2017-05-16 ENCOUNTER — Ambulatory Visit (INDEPENDENT_AMBULATORY_CARE_PROVIDER_SITE_OTHER): Payer: Managed Care, Other (non HMO) | Admitting: Family Medicine

## 2017-05-16 VITALS — BP 132/86 | HR 80 | Temp 98.2°F | Resp 16 | Ht 72.0 in | Wt 241.0 lb

## 2017-05-16 DIAGNOSIS — Z0001 Encounter for general adult medical examination with abnormal findings: Secondary | ICD-10-CM | POA: Diagnosis not present

## 2017-05-16 DIAGNOSIS — Z23 Encounter for immunization: Secondary | ICD-10-CM | POA: Diagnosis not present

## 2017-05-16 DIAGNOSIS — R7301 Impaired fasting glucose: Secondary | ICD-10-CM | POA: Diagnosis not present

## 2017-05-16 DIAGNOSIS — Z0000 Encounter for general adult medical examination without abnormal findings: Secondary | ICD-10-CM

## 2017-05-16 DIAGNOSIS — I10 Essential (primary) hypertension: Secondary | ICD-10-CM

## 2017-05-16 DIAGNOSIS — Z125 Encounter for screening for malignant neoplasm of prostate: Secondary | ICD-10-CM

## 2017-05-16 DIAGNOSIS — E7800 Pure hypercholesterolemia, unspecified: Secondary | ICD-10-CM | POA: Diagnosis not present

## 2017-05-16 LAB — COMPREHENSIVE METABOLIC PANEL
ALT: 45 U/L (ref 0–53)
AST: 28 U/L (ref 0–37)
Albumin: 4.4 g/dL (ref 3.5–5.2)
Alkaline Phosphatase: 37 U/L — ABNORMAL LOW (ref 39–117)
BILIRUBIN TOTAL: 0.7 mg/dL (ref 0.2–1.2)
BUN: 16 mg/dL (ref 6–23)
CHLORIDE: 105 meq/L (ref 96–112)
CO2: 26 meq/L (ref 19–32)
CREATININE: 1.22 mg/dL (ref 0.40–1.50)
Calcium: 9.3 mg/dL (ref 8.4–10.5)
GFR: 64.95 mL/min (ref >60.00)
Glucose, Bld: 119 mg/dL — ABNORMAL HIGH (ref 70–99)
Potassium: 4.2 mEq/L (ref 3.5–5.1)
Sodium: 141 mEq/L (ref 135–145)
Total Protein: 7.2 g/dL (ref 6.0–8.3)

## 2017-05-16 LAB — CBC WITH DIFFERENTIAL/PLATELET
Basophils Absolute: 0 10*3/uL (ref 0.0–0.1)
Basophils Relative: 0.7 % (ref 0.0–3.0)
EOS PCT: 1.7 % (ref 0.0–5.0)
Eosinophils Absolute: 0.1 10*3/uL (ref 0.0–0.7)
HCT: 46.2 % (ref 39.0–52.0)
Hemoglobin: 16 g/dL (ref 13.0–17.0)
LYMPHS ABS: 2.3 10*3/uL (ref 0.7–4.0)
Lymphocytes Relative: 43.4 % (ref 12.0–46.0)
MCHC: 34.7 g/dL (ref 30.0–36.0)
MCV: 100.2 fl — AB (ref 78.0–100.0)
MONO ABS: 0.5 10*3/uL (ref 0.1–1.0)
MONOS PCT: 8.5 % (ref 3.0–12.0)
NEUTROS ABS: 2.5 10*3/uL (ref 1.4–7.7)
NEUTROS PCT: 45.7 % (ref 43.0–77.0)
Platelets: 182 10*3/uL (ref 150.0–400.0)
RBC: 4.61 Mil/uL (ref 4.22–5.81)
RDW: 13.1 % (ref 11.5–15.5)
WBC: 5.4 10*3/uL (ref 4.0–10.5)

## 2017-05-16 LAB — LIPID PANEL
CHOL/HDL RATIO: 5
Cholesterol: 249 mg/dL — ABNORMAL HIGH (ref 0–200)
HDL: 48.8 mg/dL (ref >39.00)
LDL CALC: 171 mg/dL — AB (ref 0–99)
NONHDL: 199.87
Triglycerides: 142 mg/dL (ref 0.0–149.0)
VLDL: 28.4 mg/dL (ref 0.0–40.0)

## 2017-05-16 LAB — HEMOGLOBIN A1C: HEMOGLOBIN A1C: 6 % (ref 4.6–6.5)

## 2017-05-16 LAB — TSH: TSH: 3.49 u[IU]/mL (ref 0.35–4.50)

## 2017-05-16 LAB — PSA: PSA: 0.94 ng/mL (ref 0.10–4.00)

## 2017-05-16 MED ORDER — LISINOPRIL-HYDROCHLOROTHIAZIDE 20-12.5 MG PO TABS: ORAL_TABLET | ORAL | 1 refills | Status: DC

## 2017-05-16 NOTE — Patient Instructions (Addendum)
Increase your blood pressure pill to 1 and 1/2 tabs per day.  Check blood pressure daily. If bp still averaging over 130/80, increase to TWO tabs per day. Bring blood pressure numbers to follow up office visit in 1 month.   Health Maintenance, Male A healthy lifestyle and preventive care is important for your health and wellness. Ask your health care provider about what schedule of regular examinations is right for you. What should I know about weight and diet? Eat a Healthy Diet  Eat plenty of vegetables, fruits, whole grains, low-fat dairy products, and lean protein.  Do not eat a lot of foods high in solid fats, added sugars, or salt.  Maintain a Healthy Weight Regular exercise can help you achieve or maintain a healthy weight. You should:  Do at least 150 minutes of exercise each week. The exercise should increase your heart rate and make you sweat (moderate-intensity exercise).  Do strength-training exercises at least twice a week.  Watch Your Levels of Cholesterol and Blood Lipids  Have your blood tested for lipids and cholesterol every 5 years starting at 58 years of age. If you are at high risk for heart disease, you should start having your blood tested when you are 58 years old. You may need to have your cholesterol levels checked more often if: ? Your lipid or cholesterol levels are high. ? You are older than 58 years of age. ? You are at high risk for heart disease.  What should I know about cancer screening? Many types of cancers can be detected early and may often be prevented. Lung Cancer  You should be screened every year for lung cancer if: ? You are a current smoker who has smoked for at least 30 years. ? You are a former smoker who has quit within the past 15 years.  Talk to your health care provider about your screening options, when you should start screening, and how often you should be screened.  Colorectal Cancer  Routine colorectal cancer screening  usually begins at 58 years of age and should be repeated every 5-10 years until you are 58 years old. You may need to be screened more often if early forms of precancerous polyps or small growths are found. Your health care provider may recommend screening at an earlier age if you have risk factors for colon cancer.  Your health care provider may recommend using home test kits to check for hidden blood in the stool.  A small camera at the end of a tube can be used to examine your colon (sigmoidoscopy or colonoscopy). This checks for the earliest forms of colorectal cancer.  Prostate and Testicular Cancer  Depending on your age and overall health, your health care provider may do certain tests to screen for prostate and testicular cancer.  Talk to your health care provider about any symptoms or concerns you have about testicular or prostate cancer.  Skin Cancer  Check your skin from head to toe regularly.  Tell your health care provider about any new moles or changes in moles, especially if: ? There is a change in a mole's size, shape, or color. ? You have a mole that is larger than a pencil eraser.  Always use sunscreen. Apply sunscreen liberally and repeat throughout the day.  Protect yourself by wearing long sleeves, pants, a wide-brimmed hat, and sunglasses when outside.  What should I know about heart disease, diabetes, and high blood pressure?  If you are 3518-58 years of age,  have your blood pressure checked every 3-5 years. If you are 87 years of age or older, have your blood pressure checked every year. You should have your blood pressure measured twice-once when you are at a hospital or clinic, and once when you are not at a hospital or clinic. Record the average of the two measurements. To check your blood pressure when you are not at a hospital or clinic, you can use: ? An automated blood pressure machine at a pharmacy. ? A home blood pressure monitor.  Talk to your health care  provider about your target blood pressure.  If you are between 75-17 years old, ask your health care provider if you should take aspirin to prevent heart disease.  Have regular diabetes screenings by checking your fasting blood sugar level. ? If you are at a normal weight and have a low risk for diabetes, have this test once every three years after the age of 63. ? If you are overweight and have a high risk for diabetes, consider being tested at a younger age or more often.  A one-time screening for abdominal aortic aneurysm (AAA) by ultrasound is recommended for men aged 7-75 years who are current or former smokers. What should I know about preventing infection? Hepatitis B If you have a higher risk for hepatitis B, you should be screened for this virus. Talk with your health care provider to find out if you are at risk for hepatitis B infection. Hepatitis C Blood testing is recommended for:  Everyone born from [birth year] through 1966.  Anyone with known risk factors for hepatitis C.  Sexually Transmitted Diseases (STDs)  You should be screened each year for STDs including gonorrhea and chlamydia if: ? You are sexually active and are younger than 58 years of age. ? You are older than 58 years of age and your health care provider tells you that you are at risk for this type of infection. ? Your sexual activity has changed since you were last screened and you are at an increased risk for chlamydia or gonorrhea. Ask your health care provider if you are at risk.  Talk with your health care provider about whether you are at high risk of being infected with HIV. Your health care provider may recommend a prescription medicine to help prevent HIV infection.  What else can I do?  Schedule regular health, dental, and eye exams.  Stay current with your vaccines (immunizations).  Do not use any tobacco products, such as cigarettes, chewing tobacco, and e-cigarettes. If you need help quitting, ask  your health care provider.  Limit alcohol intake to no more than 2 drinks per day. One drink equals 12 ounces of beer, 5 ounces of wine, or 1 ounces of hard liquor.  Do not use street drugs.  Do not share needles.  Ask your health care provider for help if you need support or information about quitting drugs.  Tell your health care provider if you often feel depressed.  Tell your health care provider if you have ever been abused or do not feel safe at home. This information is not intended to replace advice given to you by your health care provider. Make sure you discuss any questions you have with your health care provider. Document Released: 10/02/2007 Document Revised: 12/03/2015 Document Reviewed: 01/07/2015 Elsevier Interactive Patient Education  Jonathan Graves.

## 2017-05-16 NOTE — Telephone Encounter (Signed)
OK, new rx with new sig eRx'd.

## 2017-05-16 NOTE — Telephone Encounter (Signed)
Copied from CRM [id number]. Topic: Quick Communication - Rx Refill/Question >> May 16, 2017  3:59 PM Jonathan Graves, Jonathan Graves wrote: Medication: lisinopril-hydrochlorothiazide (PRINZIDE,ZESTORETIC) 20-12.5 MG tablet Has the patient contacted their pharmacy? Yes (Agent: If no, request that the patient contact the pharmacy for the refill.) Preferred Pharmacy (with phone number or street name): [id number] [place], [place] - [address] Agent: Please be advised that RX refills may take up to 3 business days. We ask that you follow-up with your pharmacy.

## 2017-05-16 NOTE — Progress Notes (Signed)
Office Note 05/16/2017  CC:  Chief Complaint  Patient presents with  . Annual Exam    HPI:  Jonathan Graves is a 57 y.o. White male who is here for annual health maintenance exam.  Home bp checks the last 3 weeks: avg 150/90.  Restarted bp med at most recent visit here 3 weeks ago.  Eye and dental exams UTD. Exercise: none at this time but plans to start soon--bought a treadmill. Diet: trying to eat healthier since the recent holidays.   Past Medical History:  Diagnosis Date  . Chronic renal insufficiency, stage 3 (moderate) (HCC) 2017   GFR 60  . Diverticulitis   . Elevated transaminase level 2017   Mild: fatty liver on u/s 11/2015  . Granuloma annulare 02/20/2008   Dr. Everlene FarrierGrubar  . Hyperlipemia, mixed    TLC recommended 2017  . Hypertension   . IFG (impaired fasting glucose) 2017   gluc 131  . Obesity, Class I, BMI 30-34.9     Past Surgical History:  Procedure Laterality Date  . BICEPS TENDON REPAIR Right    2'16 -Dr. Amanda PeaGramig  . COLONOSCOPY  approx 2010   normal.  Recall 10 yrs.  Marland Kitchen. HAMMER TOE SURGERY  2015  . LAPAROSCOPIC SIGMOID COLECTOMY N/A 01/31/2015   For diverticulitis.  Procedure: LAPAROSCOPIC SIGMOID COLECTOMY SPLENIC FLEXURE MOBILIZATION;  Surgeon: Axel FillerArmando Ramirez, MD;  Location: [facility];  Service: General;  Laterality: N/A;  . LIPOMA RESECTION  2009   Dr. Stephens NovemberHolderness  . MOUTH SURGERY    . SMALL INTESTINE SURGERY  01/2015   Small bowel anastomosis developed after sigmoid colectomy --repair required.  Marland Kitchen. VASECTOMY      Family History  Problem Relation Age of Onset  . Gastric cancer Mother   . Breast cancer Mother   . Hypertension Father   . Diabetes Father   . Heart disease Father   . Early death Maternal Grandmother        MVA  . Early death Maternal Grandfather        MVA  . CVA Paternal Grandmother   . Alzheimer's disease Paternal Grandfather   . Colon cancer Neg Hx   . Liver disease Neg Hx     Social History   Socioeconomic History  .  Marital status: Married    Spouse name: Not on file  . Number of children: Not on file  . Years of education: Not on file  . Highest education level: Not on file  Social Needs  . Financial resource strain: Not on file  . Food insecurity - worry: Not on file  . Food insecurity - inability: Not on file  . Transportation needs - medical: Not on file  . Transportation needs - non-medical: Not on file  Occupational History  . Not on file  Tobacco Use  . Smoking status: Former Smoker    Packs/day: 0.25    Types: Cigarettes  . Smokeless tobacco: Never Used  Substance and Sexual Activity  . Alcohol use: Yes    Alcohol/week: 4.8 oz    Types: 2 Glasses of wine, 6 Cans of beer per week    Comment: daily  . Drug use: No  . Sexual activity: Not on file  Other Topics Concern  . Not on file  Social History Narrative   Married, 3 daughters (one set of twin daughters), 1 son--all are grown and out of the house.   Educ: colleg grad--BS.   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   Tob: no signif regular  smoking.   Alc: 2 drinks per week   Drugs: none   No hx of colon or prostate cancer.    Outpatient Medications Prior to Visit  Medication Sig Dispense Refill  . aspirin EC 81 MG tablet Take 81 mg by mouth every morning.     Marland Kitchen FIBER PO Take 1 tablet by mouth every morning.     Marland Kitchen lisinopril-hydrochlorothiazide (PRINZIDE,ZESTORETIC) 20-12.5 MG tablet Take 1 tablet by mouth every morning. 30 tablet 0  . vitamin B-12 (CYANOCOBALAMIN) 1000 MCG tablet Take 1,000 mcg by mouth every morning.     Marland Kitchen ibuprofen (ADVIL,MOTRIN) 200 MG tablet Take 200 mg by mouth every 6 (six) hours as needed for moderate pain.     No facility-administered medications prior to visit.     No Known Allergies  ROS Review of Systems  Constitutional: Negative for appetite change, chills, fatigue and fever.  HENT: Negative for congestion, dental problem, ear pain and sore throat.   Eyes: Negative for discharge, redness and  visual disturbance.  Respiratory: Negative for cough, chest tightness, shortness of breath and wheezing.   Cardiovascular: Negative for chest pain, palpitations and leg swelling.  Gastrointestinal: Negative for abdominal pain, blood in stool, diarrhea, nausea and vomiting.  Genitourinary: Negative for difficulty urinating, dysuria, flank pain, frequency, hematuria and urgency.  Musculoskeletal: Negative for arthralgias, back pain, joint swelling, myalgias and neck stiffness.  Skin: Negative for pallor and rash.  Neurological: Negative for dizziness, speech difficulty, weakness and headaches.  Hematological: Negative for adenopathy. Does not bruise/bleed easily.  Psychiatric/Behavioral: Negative for confusion and sleep disturbance. The patient is not nervous/anxious.     PE; Blood pressure 132/86, pulse 80, temperature 98.2 F (36.8 C), temperature source Oral, resp. rate 16, height 6' (1.829 m), weight 241 lb (109.3 kg), SpO2 96 %. Gen: Alert, well appearing.  Patient is oriented to person, place, time, and situation. AFFECT: pleasant, lucid thought and speech. ENT: Ears: EACs clear, normal epithelium.  TMs with good light reflex and landmarks bilaterally.  Eyes: no injection, icteris, swelling, or exudate.  EOMI, PERRLA. Nose: no drainage or turbinate edema/swelling.  No injection or focal lesion.  Mouth: lips without lesion/swelling.  Oral mucosa pink and moist.  Dentition intact and without obvious caries or gingival swelling.  Oropharynx without erythema, exudate, or swelling.  Neck: supple/nontender.  No LAD, mass, or TM.  Carotid pulses 2+ bilaterally, without bruits. CV: RRR, no m/r/g.   LUNGS: CTA bilat, nonlabored resps, good aeration in all lung fields. ABD: soft, NT, ND, BS normal.  No hepatospenomegaly or mass.  No bruits. EXT: no clubbing, cyanosis, or edema.  Musculoskeletal: no joint swelling, erythema, warmth, or tenderness.  ROM of all joints intact. Skin - no sores or  suspicious lesions or rashes or color changes Rectal exam: negative without mass, lesions or tenderness, PROSTATE EXAM: smooth and symmetric without nodules or tenderness.   Pertinent labs:  No results found for: TSH Lab Results  Component Value Date   WBC 9.4 02/05/2015   HGB 13.4 02/05/2015   HCT 38.8 (L) 02/05/2015   MCV 98.7 02/05/2015   PLT 253 02/05/2015   Lab Results  Component Value Date   CREATININE 1.2 11/13/2015   BUN 17 11/13/2015   NA 139 11/13/2015   K 4.3 11/13/2015   CL 110 02/05/2015   CO2 25 02/05/2015   Lab Results  Component Value Date   ALT 71 (A) 11/13/2015   AST 41 (A) 11/13/2015   ALKPHOS 33 11/13/2015  BILITOT 0.4 08/17/2014   Lab Results  Component Value Date   CHOL 258 (A) 11/13/2015   Lab Results  Component Value Date   HDL 44 11/13/2015   Lab Results  Component Value Date   LDLCALC 161 11/13/2015   Lab Results  Component Value Date   TRIG 265 (A) 11/13/2015    ASSESSMENT AND PLAN:   1) HTN: not ideal control.   Plan, increase lisin-hct 20/12.5 to 1 and 1/2 tabs per day.  If bp not <130/80 after about 2 weeks, then increase to 2 tabs per day.  2) Health maintenance exam: Reviewed age and gender appropriate health maintenance issues (prudent diet, regular exercise, health risks of tobacco and excessive alcohol, use of seatbelts, fire alarms in home, use of sunscreen).  Also reviewed age and gender appropriate health screening as well as vaccine recommendations. Vaccines: Tdap--given today.    Flu UTD.  Shingrix--#1 today. Labs: Fasting HP, A1c (hx of IFG) and PSA today. Prostate ca screening: DRE normal today, PSA. Colon ca screening: next colonoscopy due 2020.  An After Visit Summary was printed and given to the patient.  FOLLOW UP:  Return in about 4 weeks (around 06/13/2017) for f/u HTN.  Signed:  Santiago Bumpers, MD           05/16/2017

## 2017-05-16 NOTE — Telephone Encounter (Signed)
Prinzide 20-12.5   ( Will need new directions and number on medication Rx.)  LOV: 05/16/2017   [facility] [place]

## 2017-05-16 NOTE — Telephone Encounter (Signed)
Looks like sig was changed at last office visit, not sure how you want to sig to read. Please advise. Thanks.

## 2017-05-17 ENCOUNTER — Encounter: Payer: Self-pay | Admitting: Family Medicine

## 2017-05-17 NOTE — Telephone Encounter (Signed)
Pt advised and voiced understanding.   

## 2017-06-08 ENCOUNTER — Ambulatory Visit: Payer: Managed Care, Other (non HMO) | Admitting: Family Medicine

## 2017-07-18 DIAGNOSIS — G459 Transient cerebral ischemic attack, unspecified: Secondary | ICD-10-CM

## 2017-07-18 HISTORY — DX: Transient cerebral ischemic attack, unspecified: G45.9

## 2017-07-19 ENCOUNTER — Encounter (HOSPITAL_BASED_OUTPATIENT_CLINIC_OR_DEPARTMENT_OTHER): Payer: Self-pay

## 2017-07-19 ENCOUNTER — Emergency Department (HOSPITAL_BASED_OUTPATIENT_CLINIC_OR_DEPARTMENT_OTHER): Payer: Managed Care, Other (non HMO)

## 2017-07-19 ENCOUNTER — Observation Stay (HOSPITAL_BASED_OUTPATIENT_CLINIC_OR_DEPARTMENT_OTHER)
Admission: EM | Admit: 2017-07-19 | Discharge: 2017-07-20 | Disposition: A | Discharge status: Home / Self Care | Payer: Managed Care, Other (non HMO) | Attending: Family Medicine | Admitting: Family Medicine

## 2017-07-19 ENCOUNTER — Other Ambulatory Visit: Payer: Self-pay

## 2017-07-19 ENCOUNTER — Observation Stay (HOSPITAL_COMMUNITY): Payer: Managed Care, Other (non HMO)

## 2017-07-19 DIAGNOSIS — G459 Transient cerebral ischemic attack, unspecified: Secondary | ICD-10-CM | POA: Diagnosis not present

## 2017-07-19 DIAGNOSIS — R4789 Other speech disturbances: Secondary | ICD-10-CM | POA: Diagnosis present

## 2017-07-19 DIAGNOSIS — Z7982 Long term (current) use of aspirin: Secondary | ICD-10-CM | POA: Insufficient documentation

## 2017-07-19 DIAGNOSIS — Z87891 Personal history of nicotine dependence: Secondary | ICD-10-CM | POA: Insufficient documentation

## 2017-07-19 DIAGNOSIS — N182 Chronic kidney disease, stage 2 (mild): Secondary | ICD-10-CM | POA: Diagnosis not present

## 2017-07-19 DIAGNOSIS — I10 Essential (primary) hypertension: Secondary | ICD-10-CM | POA: Diagnosis present

## 2017-07-19 DIAGNOSIS — Z79899 Other long term (current) drug therapy: Secondary | ICD-10-CM | POA: Insufficient documentation

## 2017-07-19 DIAGNOSIS — I129 Hypertensive chronic kidney disease with stage 1 through stage 4 chronic kidney disease, or unspecified chronic kidney disease: Secondary | ICD-10-CM | POA: Diagnosis not present

## 2017-07-19 LAB — TROPONIN I

## 2017-07-19 LAB — COMPREHENSIVE METABOLIC PANEL
ALT: 65 U/L — AB (ref 17–63)
AST: 50 U/L — ABNORMAL HIGH (ref 15–41)
Albumin: 4.3 g/dL (ref 3.5–5.0)
Alkaline Phosphatase: 38 U/L (ref 38–126)
Anion gap: 11 (ref 5–15)
BILIRUBIN TOTAL: 0.8 mg/dL (ref 0.3–1.2)
BUN: 19 mg/dL (ref 6–20)
CALCIUM: 9.2 mg/dL (ref 8.9–10.3)
CHLORIDE: 102 mmol/L (ref 101–111)
CO2: 23 mmol/L (ref 22–32)
CREATININE: 1.25 mg/dL — AB (ref 0.61–1.24)
Glucose, Bld: 100 mg/dL — ABNORMAL HIGH (ref 65–99)
Potassium: 3.7 mmol/L (ref 3.5–5.1)
Sodium: 136 mmol/L (ref 135–145)
TOTAL PROTEIN: 7.8 g/dL (ref 6.5–8.1)

## 2017-07-19 LAB — DIFFERENTIAL
Basophils Absolute: 0 10*3/uL (ref 0.0–0.1)
Basophils Relative: 0 %
Eosinophils Absolute: 0 10*3/uL (ref 0.0–0.7)
Eosinophils Relative: 1 %
LYMPHS ABS: 3.8 10*3/uL (ref 0.7–4.0)
Lymphocytes Relative: 52 %
MONO ABS: 0.8 10*3/uL (ref 0.1–1.0)
MONOS PCT: 11 %
NEUTROS ABS: 2.6 10*3/uL (ref 1.7–7.7)
Neutrophils Relative %: 36 %

## 2017-07-19 LAB — CBC
HEMATOCRIT: 44 % (ref 39.0–52.0)
Hemoglobin: 15.5 g/dL (ref 13.0–17.0)
MCH: 34.9 pg — ABNORMAL HIGH (ref 26.0–34.0)
MCHC: 35.2 g/dL (ref 30.0–36.0)
MCV: 99.1 fL (ref 78.0–100.0)
Platelets: 162 10*3/uL (ref 150–400)
RBC: 4.44 MIL/uL (ref 4.22–5.81)
RDW: 13.8 % (ref 11.5–15.5)
WBC: 7.3 10*3/uL (ref 4.0–10.5)

## 2017-07-19 LAB — PROTIME-INR
INR: 0.89
Prothrombin Time: 12 seconds (ref 11.4–15.2)

## 2017-07-19 LAB — APTT: aPTT: 30 seconds (ref 24–36)

## 2017-07-19 MED ORDER — STROKE: EARLY STAGES OF RECOVERY BOOK
Freq: Once | Status: AC
  Administered 2017-07-20: 06:00:00

## 2017-07-19 MED ORDER — ASPIRIN 81 MG PO CHEW
324.0000 mg | CHEWABLE_TABLET | Freq: Once | ORAL | Status: AC
  Administered 2017-07-19: 324 mg via ORAL
  Filled 2017-07-19: qty 4

## 2017-07-19 MED ORDER — IOPAMIDOL (ISOVUE-370) INJECTION 76%
INTRAVENOUS | Status: AC
  Administered 2017-07-19: 50 mL
  Filled 2017-07-19: qty 50

## 2017-07-19 NOTE — ED Notes (Signed)
Teleneuro consult complete.

## 2017-07-19 NOTE — ED Notes (Signed)
Report to Asher MuirJamie, Charity fundraiserN at [facility]

## 2017-07-19 NOTE — Progress Notes (Signed)
Received report from Research scientist (life sciences)Adrien RN at [facility].

## 2017-07-19 NOTE — ED Notes (Signed)
Patient on the cardiac monitor with vitals set to Q 30 mins.

## 2017-07-19 NOTE — Consult Note (Signed)
[facility] TeleNeurology Consult Services  Date of service:  07/19/17   Impression:  TIA likely localizing to right MCA subcortical region.  Patient is not a candidate for TPA or thrombectomy due resolved sxs/outside window for TPA and presentation not consistent with LVO.   Differential diagnosis for stroke mechanism: _ 1. Cardioembolic stroke  2. Small vessel disease/lacune  3. Thromboembolic, artery-to-artery mechanism  4. Hypercoagulable state-related infarct  5. Transient ischemic attack  6. Thrombotic mechanism, large artery disease    Comments: Time last normal:  12:30 Door time:  16:47 Time Teleneurologist paged:  17:12 Time Teleneurologist logged in, first attempt:  17:12 [facility] at bedside:  17:16 NIHSS assessment time: 17:16   Recommendations: Admission via hospitalist service  continue aspirin 81mg  qd escalation of antiplatelet deferred to inpatient neuro team  NPO until bedside swallow evaluation completed  Neuroprotective measures suggested:  head of bed elevated no higher than 30 degrees, euglycemia (glucose goal 80-150 ideally, can treat with sliding scale insulin if needed), normal temperature goal < 37.5C, can treat with tylenol as needed), isotonic IV fluids only, avoid hypotension and precipitous drop in BP (ideal systolic BP goal [id number], can bolus with NS IVF if needed), avoid severe HTN (can treat BP > 220/120 with IV anti HTN as needed)  Please call with further questions and/or any new concerns [phone])   Inpatient Neurology Consultation  Stroke workup per inpatient neurology team  Patient appeared somewhat dismissive of rec to be admitted; I gently encouraged him to reconsider this. Discussed with ED MD   _______________________________________________________________________________________   CC:  left face/arm numbness, speech and vision change  History of Present Illness  57 year old man with  HTN, HLD already on  aspirin daily presents for initial episode of vague blurred vision (he cannot describe further), left jaw and arm numbness that resolved within 30minutes.  He then noted the numbness recur in the left jaw/arm at 14:30 along with "mild difficulty processing, speech" but no vision change.  He denies prior similar sxs, stroke, CAD, DM.  Diagnostic: CT head: negative  Exam:   BP 153/86  NIHSS score:  0  (see below)   1A: Level of Consciousness - Alert; keenly responsive 0 1B: Ask Month and Age - Both Questions Right 0 1C: 'Blink Eyes' & 'Squeeze Hands' - Performs Both Tasks 0 2: Test Horizontal Extraocular Movements - Normal 03: Test Visual Fields - No Visual Loss 04: Test Facial Palsy - Normal symmetry 05A: Test Left Arm Motor Drift - No Drift for 10 Seconds 05B: Test Right Arm Motor Drift - No Drift for 10 Seconds 06A: Test Left Leg Motor Drift - No Drift for 5 Seconds 06B: Test Right Leg Motor Drift - No Drift for 5 Seconds 07: Test Limb Ataxia - No Ataxia 08: Test Sensation - Normal; No sensory loss 09: Test Language/Aphasia - Normal; No aphasia 010: Test Dysarthria - Normal 011: Test Extinction/Inattention - No abnormality 0    Medical Data Reviewed:  1.Data reviewed include clinical labs, radiology, medical tests;   2.Tests results discussed with performing or interpreting physician;   3. Prior medical records available reviewed;  4. Additional case history obtained from additional source(s);  5. Independent review of image, tracing or specimen  Medical Decision Making:  - Extensive number of diagnoses or management options considered above.   - Extensive amount of complex data reviewed.   - High risk of complication and/or morbidity or mortality are associated with differential diagnostic considerations above.  -  There may be uncertain outcome and increased probability of prolonged functional impairment or high probability of severe prolonged functional impairment associated with some  of these differential diagnosis.   Patient/proxy was informed the Neurology Consult would happen via telehealth (remote video) and consented to receiving care in this manner.

## 2017-07-19 NOTE — ED Triage Notes (Signed)
Pt states at lunch today 12pm he started having blurred vision, facial numbness, left arm numbness-sx intermittent and returned 3pm with "difficulty talking"-NAD-steady gait

## 2017-07-19 NOTE — ED Notes (Signed)
Patient transported to CT 

## 2017-07-19 NOTE — Consult Note (Signed)
Neurology Consultation  Reason for Consult: TIA Referring Physician: Dr. Onalee Hua  CC: Vision changes, speech problem, left sided numbness and weakness  History is obtained from: patient and chart  HPI: Jonathan Graves is a 57 y.o. male with PMH of CKD 2, HTN, fatty liver, obesity, impaired glucose tolerance, who presented to [facility] emergency room for evaluation of multitude of symptoms concerning for stroke/TIA. Patient reports that he was at lunch around 12:30 PM when he started noticing some changes in his vision.  He does not know whether the vision was affected in one eye or both what he describes as seeing some fluid like levels in his visual fields.  This lasted for about 30 minutes and then resolved.  He was at a meeting after lunch during which she noticed that he had some numbness on his jaw.  As he was walking outside to his car, he noted that his left arm also felt numb and heavy. He had had, with the visual changes, some jaw numbness that had initially resolved.  All his symptoms completely resolved by the time he presented to [facility] but because of the symptoms of vision, numbness and he reports some problem with speech which he is unable to clearly explain, he came to the ER for further evaluation. He denied any preceding headaches or headaches after the event.  He said if he had any headache it was not severe enough that he can recollect. He denies current smoking.  He quit 2 years ago.  Denies illicit drug use.  Drinks at least 3 beers a day.  He has cut down his drinking from before after diagnosis of fatty liver has been made. Denies any preceding illnesses of fevers chills sore throat.  Denies nausea vomiting.  Denies abdominal pain discomfort.  Denies chest pain, palpitations.  Denies cough or shortness of breath.  Denies easy bruising bleeding.  Denies family history of strokes or heart attacks in young.  Patient was seen by telemedicine  neurology, who recommended that a TIA workup be pursued.  He was transferred to [facility] for that.  LKW: 12:30 PM on 07/19/2017 tpa given?: no, outside the window, symptoms resolved Premorbid modified Rankin scale (mRS): 0  ROS: ROS was performed and is negative except as noted in the HPI.   Past Medical History:  Diagnosis Date  . Chronic renal insufficiency, stage 2 (mild) 2017   GFR 60s  . Diverticulitis   . Elevated transaminase level 2017   Mild: fatty liver on u/s 11/2015  . Granuloma annulare 02/20/2008   Dr. Everlene Farrier  . Hyperlipemia, mixed    TLC recommended 2017.  Statin recommended 04/2017 but pt declined.  . Hypertension   . IFG (impaired fasting glucose) 2017   gluc 131.  04/2017 fasting gluc 119, A1c 6.0%.  . Obesity, Class I, BMI 30-34.9     Family History  Problem Relation Age of Onset  . Gastric cancer Mother   . Breast cancer Mother   . Hypertension Father   . Diabetes Father   . Heart disease Father   . Early death Maternal Grandmother        MVA  . Early death Maternal Grandfather        MVA  . CVA Paternal Grandmother   . Alzheimer's disease Paternal Grandfather   . Colon cancer Neg Hx   . Liver disease Neg Hx    Social History:   reports that he has quit smoking. His  smoking use included cigarettes. He smoked 0.25 packs per day. He has never used smokeless tobacco. He reports that he drinks about 4.8 oz of alcohol per week. He reports that he does not use drugs. 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 Quit smoking 2 years ago. Denies illicit drug use Drinks 2-3 beers every night.  Has cut down from prior consumption of hard liquor which was much higher.  Medications No current facility-administered medications for this encounter.   Exam: Current vital signs: BP (!) 162/85 (BP Location: Left Arm)   Pulse (!) 57   Temp 98.7 F (37.1 C) (Oral)   Resp 18   Ht 6' (1.829 m)   Wt 109.9 kg (242 lb 4.6 oz)   SpO2 98%   BMI 32.86 kg/m  Vital  signs in last 24 hours: Temp:  [98.7 F (37.1 C)-99.2 F (37.3 C)] 98.7 F (37.1 C) (04/02 2051) Pulse Rate:  [50-71] 57 (04/02 2051) Resp:  [12-18] 18 (04/02 2051) BP: (149-166)/(76-97) 162/85 (04/02 2051) SpO2:  [97 %-100 %] 98 % (04/02 2051) Weight:  [109.9 kg (242 lb 4.6 oz)] 109.9 kg (242 lb 4.6 oz) (04/02 1630)  GENERAL: Awake, alert in NAD HEENT: - Normocephalic and atraumatic, dry mm, no LN++, no Thyromegally LUNGS - Clear to auscultation bilaterally with no wheezes CV - S1S2 RRR, no m/r/g, equal pulses bilaterally. ABDOMEN - Soft, nontender, nondistended with normoactive BS Ext: warm, well perfused, intact peripheral pulses, no edema  NEURO:  Mental Status: AA&Ox3  Language: speech is clear.  Naming, repetition, fluency, and comprehension intact. Cranial Nerves: PERRL. EOMI, visual fields full, no facial asymmetry, facial sensation intact, hearing intact, tongue/uvula/soft palate midline, normal sternocleidomastoid and trapezius muscle strength. No evidence of tongue atrophy or fibrillations Motor: 5/5 all over with no vertical drift noted. Tone: is normal and bulk is normal Sensation- Intact to light touch bilaterally, with no extinction Coordination: FTN intact bilaterally, no ataxia in BLE. Gait- deferred  NIHSS -0   Labs I have reviewed labs in epic and the results pertinent to this consultation are:  CBC    Component Value Date/Time   WBC 7.3 07/19/2017 1650   RBC 4.44 07/19/2017 1650   HGB 15.5 07/19/2017 1650   HCT 44.0 07/19/2017 1650   PLT 162 07/19/2017 1650   MCV 99.1 07/19/2017 1650   MCH 34.9 (H) 07/19/2017 1650   MCHC 35.2 07/19/2017 1650   RDW 13.8 07/19/2017 1650   LYMPHSABS 3.8 07/19/2017 1650   MONOABS 0.8 07/19/2017 1650   EOSABS 0.0 07/19/2017 1650   BASOSABS 0.0 07/19/2017 1650    CMP     Component Value Date/Time   NA 136 07/19/2017 1650   NA 139 11/13/2015   K 3.7 07/19/2017 1650   CL 102 07/19/2017 1650   CO2 23 07/19/2017  1650   GLUCOSE 100 (H) 07/19/2017 1650   BUN 19 07/19/2017 1650   BUN 17 11/13/2015   CREATININE 1.25 (H) 07/19/2017 1650   CALCIUM 9.2 07/19/2017 1650   PROT 7.8 07/19/2017 1650   ALBUMIN 4.3 07/19/2017 1650   AST 50 (H) 07/19/2017 1650   ALT 65 (H) 07/19/2017 1650   ALKPHOS 38 07/19/2017 1650   BILITOT 0.8 07/19/2017 1650   GFRNONAA >60 07/19/2017 1650   GFRAA >60 07/19/2017 1650    Lipid Panel     Component Value Date/Time   CHOL 249 (H) 05/16/2017 0851   TRIG 142.0 05/16/2017 0851   HDL 48.80 05/16/2017 0851   CHOLHDL 5  05/16/2017 0851   VLDL 28.4 05/16/2017 0851   LDLCALC 171 (H) 05/16/2017 0851   Total cholesterol 249, LDL 171 in January 2019.  Imaging I have reviewed the images obtained:  CT-scan of the brain-no acute changes.  Assessment:  57 year old man with past history as above presenting with symptoms of visual disturbance, speech disturbance, numbness or weakness of the left face and arm.  All symptoms resolved. Presentation concerning for stroke/TIA. No reported headache prior to, during or after these episodes so the diagnosis of complex migraine is less likely. Systolic blood pressure was in the 150s-160s hence hypertensive urgency also is unlikely. Not a candidate for IV TPA is outside the window and symptoms resolved. Not a candidate for endovascular procedure due to lack of cortical signs.  Impression: Evaluate for stroke/TIA Less likely complex migraine Less likely hypertensive urgency  Recommendations: Telemetry Frequent neurochecks Aspirin 325 p.o. daily Atorvastatin 80 p.o. daily 2D echo MRI brain without contrast CT angiogram of the head and neck A1c LDL UDS PT, OT, speech therapy consults Continue to avoid smoking.  Decrease alcohol intake. Maintain healthy lifestyle with regular exercise. Further recommendations after stroke team rounding in the morning based on imaging and test results.  Please page stroke NP/PA/MD (listed on  AMION)  from 8am-4 pm as this patient will be followed by the stroke team at this point.  -- Milon Dikes, MD [facility] Pager: [phone] If 7pm to 7am, please call on call as listed on AMION.

## 2017-07-19 NOTE — ED Provider Notes (Signed)
[facility] EMERGENCY DEPARTMENT Provider Note   CSN: [id number] Arrival date & time: 07/19/17  1627     History   Chief Complaint Chief Complaint  Patient presents with  . Blurred Vision    HPI Owyn Raulston is a 57 y.o. male. Patient has a history of hypertension and high cholesterol.  At about 1230 today he experienced some change in his vision.  He cannot say whether it was one eye or both eyes.  He noticed some lines and difficulty focusing.  Followed by that he had some left jaw numbness and then some left arm numbness.  This lasted about 30 minutes.  About 230 today he had a recurrence of the left arm numbness and he felt he was having difficulty with his speech.  He states he still cannot articulate well but it was in his rapid and fluid as it normally was.  He was at work and talking with coworkers and they noticed this.  The symptoms lasted about 40 minutes and have resolved. Since then he has felt fatigued.  He presents now to the emergency department stating he feels back to baseline.  This is never happened before.  He denies any recent illness.  His last medication change was about 2 months ago when they put him on his antihypertensive.  He denies smoking drinking or any street drugs.  Currently he is noticing no symptoms at all other than feeling a little anxious about what is going on.   The history is provided by the patient.   Cerebrovascular Accident   This is a new problem. Episode onset: 2 1/2 hours.  The problem has been resolved. Pertinent negatives include no chest pain, no abdominal pain, no headaches and no shortness of breath.  Nothing aggravates the symptoms.  Nothing relieves the symptoms. He has tried nothing for the symptoms.    Past Medical History:  Diagnosis Date  . Chronic renal insufficiency, stage 2 (mild) 2017   GFR 60s  . Diverticulitis   . Elevated transaminase level 2017   Mild: fatty liver on u/s 11/2015  . Granuloma annulare  02/20/2008   Dr. Everlene Farrier  . Hyperlipemia, mixed    TLC recommended 2017.  Statin recommended 04/2017 but pt declined.  . Hypertension   . IFG (impaired fasting glucose) 2017   gluc 131.  04/2017 fasting gluc 119, A1c 6.0%.  . Obesity, Class I, BMI 30-34.9     Patient Active Problem List   Diagnosis Date Noted  . S/P laparoscopic-assisted sigmoidectomy 01/31/2015  . Diverticulitis of intestine with abscess without bleeding   . Malnutrition of moderate degree (HCC) 08/18/2014  . Alcohol use 08/17/2014  . Hypertension   . Diverticulitis 08/16/2014  . Diverticulitis of colon with perforation 08/16/2014    Past Surgical History:  Procedure Laterality Date  . BICEPS TENDON REPAIR Right    2'16 -Dr. Amanda Pea  . COLONOSCOPY  approx 2010   normal.  Recall 10 yrs.  Marland Kitchen HAMMER TOE SURGERY  2015  . LAPAROSCOPIC SIGMOID COLECTOMY N/A 01/31/2015   For diverticulitis.  Procedure: LAPAROSCOPIC SIGMOID COLECTOMY SPLENIC FLEXURE MOBILIZATION;  Surgeon: Axel Filler, MD;  Location: [facility];  Service: General;  Laterality: N/A;  . LIPOMA RESECTION  2009   Dr. Stephens November  . MOUTH SURGERY    . SMALL INTESTINE SURGERY  01/2015   Small bowel anastomosis developed after sigmoid colectomy --repair required.  Marland Kitchen VASECTOMY          Home Medications  Prior to Admission medications   Medication Sig Start Date End Date Taking? Authorizing Provider  aspirin EC 81 MG tablet Take 81 mg by mouth every morning.     [provider]  FIBER PO Take 1 tablet by mouth every morning.     [provider]  ibuprofen (ADVIL,MOTRIN) 200 MG tablet Take 200 mg by mouth every 6 (six) hours as needed for moderate pain.    [provider]  lisinopril-hydrochlorothiazide (PRINZIDE,ZESTORETIC) 20-12.5 MG tablet 1.5 - 2 tabs po qd 05/16/17   McGowen, Maryjean MornPhilip H, MD  vitamin B-12 (CYANOCOBALAMIN) 1000 MCG tablet Take 1,000 mcg by mouth every morning.     [provider]    Family  History Family History  Problem Relation Age of Onset  . Gastric cancer Mother   . Breast cancer Mother   . Hypertension Father   . Diabetes Father   . Heart disease Father   . Early death Maternal Grandmother        MVA  . Early death Maternal Grandfather        MVA  . CVA Paternal Grandmother   . Alzheimer's disease Paternal Grandfather   . Colon cancer Neg Hx   . Liver disease Neg Hx     Social History Social History   Tobacco Use  . Smoking status: Former Smoker    Packs/day: 0.25    Types: Cigarettes  . Smokeless tobacco: Never Used  Substance Use Topics  . Alcohol use: Yes    Alcohol/week: 4.8 oz    Types: 2 Glasses of wine, 6 Cans of beer per week    Comment: daily  . Drug use: No     Allergies   Patient has no known allergies.   Review of Systems Review of Systems  Constitutional: Positive for fatigue.  HENT: Negative for hearing loss, sore throat and tinnitus.   Eyes: Positive for visual disturbance. Negative for pain.  Respiratory: Negative for cough and shortness of breath.   Cardiovascular: Negative for chest pain and palpitations.  Gastrointestinal: Negative for abdominal pain, nausea and vomiting.  Genitourinary: Negative for dysuria and frequency.  Musculoskeletal: Negative for back pain and neck pain.  Neurological: Positive for speech difficulty and numbness. Negative for tremors, seizures, syncope, weakness and headaches.  Psychiatric/Behavioral: Negative for confusion.     Physical Exam Updated Vital Signs BP (!) 161/88 (BP Location: Left Arm)   Pulse 71   Temp 99.2 F (37.3 C) (Oral)   Resp 18   Ht 6' (1.829 m)   Wt 109.9 kg (242 lb 4.6 oz)   SpO2 97%   BMI 32.86 kg/m    Physical Exam  Constitutional: He is oriented to person, place, and time. He appears well-developed and well-nourished.  HENT:  Head: Normocephalic and atraumatic.  Eyes: Conjunctivae are normal.  Neck: Neck supple.  Cardiovascular: Normal rate and regular  rhythm.  No murmur heard. Pulmonary/Chest: Effort normal and breath sounds normal. No respiratory distress.  Abdominal: Soft. There is no tenderness.  Musculoskeletal: He exhibits no edema.  Neurological: He is alert and oriented to person, place, and time. He has normal strength. No cranial nerve deficit or sensory deficit. He displays a negative Romberg sign. Gait normal. GCS eye subscore is 4. GCS verbal subscore is 5. GCS motor subscore is 6.  Patient's visual fields were intact to confrontation.   Skin: Skin is warm and dry.  Psychiatric: He has a normal mood and affect.  Nursing note and vitals reviewed.  ED Treatments / Results  Labs (all labs ordered are listed, but only abnormal results are displayed) Labs Reviewed  CBC - Abnormal; Notable for the following components:      Result Value   MCH 34.9 (*)    All other components within normal limits  COMPREHENSIVE METABOLIC PANEL - Abnormal; Notable for the following components:   Glucose, Bld 100 (*)    Creatinine, Ser 1.25 (*)    AST 50 (*)    ALT 65 (*)    All other components within normal limits  HEMOGLOBIN A1C - Abnormal; Notable for the following components:   Hgb A1c MFr Bld 5.9 (*)    All other components within normal limits  CBC - Abnormal; Notable for the following components:   MCV 100.5 (*)    MCH 34.1 (*)    Platelets 141 (*)    All other components within normal limits  COMPREHENSIVE METABOLIC PANEL - Abnormal; Notable for the following components:   Glucose, Bld 114 (*)    AST 49 (*)    ALT 65 (*)    Total Bilirubin 1.3 (*)    All other components within normal limits  LIPID PANEL - Abnormal; Notable for the following components:   Cholesterol 247 (*)    Triglycerides 223 (*)    VLDL 45 (*)    LDL Cholesterol 155 (*)    All other components within normal limits  PROTIME-INR  APTT  DIFFERENTIAL  TROPONIN I  RAPID URINE DRUG SCREEN, HOSP PERFORMED  URINALYSIS, ROUTINE W REFLEX MICROSCOPIC   LIPID PANEL  HIV ANTIBODY (ROUTINE TESTING)    EKG EKG Interpretation  Date/Time:  Tuesday July 19 2017 16:43:00 EDT Ventricular Rate:  69 PR Interval:    QRS Duration: 116 QT Interval:  396 QTC Calculation: 425 R Axis:   17 Text Interpretation:  Sinus rhythm Nonspecific intraventricular conduction delay similar to prior 12/15 Confirmed by Meridee Score [phone]) on 07/19/2017 5:00:39 PM   Radiology Ct Angio Head W Or Wo Contrast  Result Date: 07/19/2017 CLINICAL DATA:  Blurred vision, left arm numbness and difficulty speaking. EXAM: CT ANGIOGRAPHY HEAD AND NECK TECHNIQUE: Multidetector CT imaging of the head and neck was performed using the standard protocol during bolus administration of intravenous contrast. Multiplanar CT image reconstructions and MIPs were obtained to evaluate the vascular anatomy. Carotid stenosis measurements (when applicable) are obtained utilizing NASCET criteria, using the distal internal carotid diameter as the denominator. CONTRAST:  50mL ISOVUE-370 IOPAMIDOL (ISOVUE-370) INJECTION 76% COMPARISON:  Head CT 07/19/2017 FINDINGS: CTA NECK FINDINGS AORTIC ARCH: There is mild calcific atherosclerosis of the aortic arch. There is no aneurysm, dissection or hemodynamically significant stenosis of the visualized ascending aorta and aortic arch. Conventional 3 vessel aortic branching pattern. The visualized proximal subclavian arteries are widely patent. RIGHT CAROTID SYSTEM: --Common carotid artery: Widely patent origin without common carotid artery dissection or aneurysm. --Internal carotid artery: Mixed density plaque at the bifurcation with noncalcified plaque extending into the proximal left internal carotid artery with less than 50% stenosis. --External carotid artery: No acute abnormality. LEFT CAROTID SYSTEM: --Common carotid artery: Widely patent origin without common carotid artery dissection or aneurysm. --Internal carotid artery:No dissection, occlusion or  aneurysm. Mild atherosclerotic calcification at the carotid bifurcation without hemodynamically significant stenosis. --External carotid artery: No acute abnormality. VERTEBRAL ARTERIES: Right dominant configuration. Both origins are normal. No dissection, occlusion or flow-limiting stenosis to the vertebrobasilar confluence. SKELETON: There is no bony spinal canal stenosis. No lytic or blastic lesion. OTHER  NECK: Normal pharynx, larynx and major salivary glands. No cervical lymphadenopathy. Unremarkable thyroid gland. UPPER CHEST: No pneumothorax or pleural effusion. No nodules or masses. CTA HEAD FINDINGS ANTERIOR CIRCULATION: --Intracranial internal carotid arteries: Atherosclerotic calcification of the internal carotid arteries at the skull base without hemodynamically significant stenosis. --Anterior cerebral arteries: Normal. Both A1 segments are present. Patent anterior communicating artery. --Middle cerebral arteries: Normal. --Posterior communicating arteries: Absent bilaterally. POSTERIOR CIRCULATION: --Basilar artery: Normal. --Posterior cerebral arteries: Normal. --Superior cerebellar arteries: Normal. --Inferior cerebellar arteries: Normal anterior and posterior inferior cerebellar arteries. VENOUS SINUSES: Diminutive left transverse sinus is a normal congenital variant. ANATOMIC VARIANTS: None DELAYED PHASE: No parenchymal contrast enhancement. Review of the MIP images confirms the above findings. IMPRESSION: 1. No aneurysm, occlusion or hemodynamically significant stenosis of the arteries of the head and neck. No arterial malformation or evidence for vasculitis. 2. Bilateral mixed density carotid bifurcation/proximal ICA atherosclerosis without hemodynamically significant stenosis. 3.  Aortic Atherosclerosis (ICD10-I70.0). Electronically Signed   By: Deatra Robinson M.D.   On: 07/19/2017 22:55   Ct Angio Neck W Or Wo Contrast  Result Date: 07/19/2017 CLINICAL DATA:  Blurred vision, left arm numbness  and difficulty speaking. EXAM: CT ANGIOGRAPHY HEAD AND NECK TECHNIQUE: Multidetector CT imaging of the head and neck was performed using the standard protocol during bolus administration of intravenous contrast. Multiplanar CT image reconstructions and MIPs were obtained to evaluate the vascular anatomy. Carotid stenosis measurements (when applicable) are obtained utilizing NASCET criteria, using the distal internal carotid diameter as the denominator. CONTRAST:  50mL ISOVUE-370 IOPAMIDOL (ISOVUE-370) INJECTION 76% COMPARISON:  Head CT 07/19/2017 FINDINGS: CTA NECK FINDINGS AORTIC ARCH: There is mild calcific atherosclerosis of the aortic arch. There is no aneurysm, dissection or hemodynamically significant stenosis of the visualized ascending aorta and aortic arch. Conventional 3 vessel aortic branching pattern. The visualized proximal subclavian arteries are widely patent. RIGHT CAROTID SYSTEM: --Common carotid artery: Widely patent origin without common carotid artery dissection or aneurysm. --Internal carotid artery: Mixed density plaque at the bifurcation with noncalcified plaque extending into the proximal left internal carotid artery with less than 50% stenosis. --External carotid artery: No acute abnormality. LEFT CAROTID SYSTEM: --Common carotid artery: Widely patent origin without common carotid artery dissection or aneurysm. --Internal carotid artery:No dissection, occlusion or aneurysm. Mild atherosclerotic calcification at the carotid bifurcation without hemodynamically significant stenosis. --External carotid artery: No acute abnormality. VERTEBRAL ARTERIES: Right dominant configuration. Both origins are normal. No dissection, occlusion or flow-limiting stenosis to the vertebrobasilar confluence. SKELETON: There is no bony spinal canal stenosis. No lytic or blastic lesion. OTHER NECK: Normal pharynx, larynx and major salivary glands. No cervical lymphadenopathy. Unremarkable thyroid gland. UPPER CHEST:  No pneumothorax or pleural effusion. No nodules or masses. CTA HEAD FINDINGS ANTERIOR CIRCULATION: --Intracranial internal carotid arteries: Atherosclerotic calcification of the internal carotid arteries at the skull base without hemodynamically significant stenosis. --Anterior cerebral arteries: Normal. Both A1 segments are present. Patent anterior communicating artery. --Middle cerebral arteries: Normal. --Posterior communicating arteries: Absent bilaterally. POSTERIOR CIRCULATION: --Basilar artery: Normal. --Posterior cerebral arteries: Normal. --Superior cerebellar arteries: Normal. --Inferior cerebellar arteries: Normal anterior and posterior inferior cerebellar arteries. VENOUS SINUSES: Diminutive left transverse sinus is a normal congenital variant. ANATOMIC VARIANTS: None DELAYED PHASE: No parenchymal contrast enhancement. Review of the MIP images confirms the above findings. IMPRESSION: 1. No aneurysm, occlusion or hemodynamically significant stenosis of the arteries of the head and neck. No arterial malformation or evidence for vasculitis. 2. Bilateral mixed density carotid bifurcation/proximal ICA atherosclerosis without hemodynamically significant  stenosis. 3.  Aortic Atherosclerosis (ICD10-I70.0). Electronically Signed   By: Deatra Robinson M.D.   On: 07/19/2017 22:55   Mr Brain Wo Contrast  Result Date: 07/20/2017 CLINICAL DATA:  Vision changes.  Left arm numbness and weakness. EXAM: MRI HEAD WITHOUT CONTRAST TECHNIQUE: Multiplanar, multiecho pulse sequences of the brain and surrounding structures were obtained without intravenous contrast. COMPARISON:  None. FINDINGS: BRAIN: The midline structures are normal. There is no acute infarct or acute hemorrhage. No mass lesion, hydrocephalus, dural abnormality or extra-axial collection. The brain parenchymal signal is normal for the patient's age. No age-advanced or lobar predominant atrophy. No chronic microhemorrhage or superficial siderosis. VASCULAR:  Major intracranial arterial and venous sinus flow voids are preserved. SKULL AND UPPER CERVICAL SPINE: The visualized skull base, calvarium, upper cervical spine and extracranial soft tissues are normal. SINUSES/ORBITS: No fluid levels or advanced mucosal thickening. No mastoid or middle ear effusion. Normal orbits. IMPRESSION: Normal brain. Electronically Signed   By: Deatra Robinson M.D.   On: 07/20/2017 04:03   Ct Head Code Stroke Wo Contrast  Result Date: 07/19/2017 CLINICAL DATA:  Code stroke. 58 year old male with onset of blurred vision, facial numbness and left arm numbness at lunch today. Transient symptoms initially, but recurred at 1500 hours with abnormal speech. EXAM: CT HEAD WITHOUT CONTRAST TECHNIQUE: Contiguous axial images were obtained from the base of the skull through the vertex without intravenous contrast. COMPARISON:  None. FINDINGS: Brain: No midline shift, ventriculomegaly, mass effect, evidence of mass lesion, intracranial hemorrhage or evidence of cortically based acute infarction. Gray-white matter differentiation is within normal limits throughout the brain. No encephalomalacia identified. Vascular: Calcified atherosclerosis at the skull base. No suspicious intracranial vascular hyperdensity. Skull: Negative. Sinuses/Orbits: Clear. Other: Visualized orbits and scalp soft tissues are within normal limits. ASPECTS [facility] Stroke Program Early CT Score) - Ganglionic level infarction (caudate, lentiform nuclei, internal capsule, insula, M1-M3 cortex): 7 - Supraganglionic infarction (M4-M6 cortex): 3 Total score (0-10 with 10 being normal): 10 IMPRESSION: 1. Negative noncontrast CT appearance of the brain. 2. ASPECTS is 10. 3. Study discussed by telephone with Dr. Meridee Score on 07/19/2017 at 17:09 . Electronically Signed   By: Odessa Fleming M.D.   On: 07/19/2017 17:10    Procedures .Critical Care Performed by: Terrilee Files, MD Authorized by: Terrilee Files, MD   Critical care  provider statement:    Critical care time (minutes):  30   Critical care time was exclusive of:  Separately billable procedures and treating other patients   Critical care was necessary to treat or prevent imminent or life-threatening deterioration of the following conditions:  CNS failure or compromise   Critical care was time spent personally by me on the following activities:  Development of treatment plan with patient or surrogate, discussions with consultants, evaluation of patient's response to treatment, examination of patient, obtaining history from patient or surrogate, ordering and performing treatments and interventions, ordering and review of laboratory studies, ordering and review of radiographic studies, pulse oximetry, re-evaluation of patient's condition and review of old charts   I assumed direction of critical care for this patient from another provider in my specialty: no     (including critical care time)  Medications Ordered in ED Medications - No data to display   Initial Impression / Assessment and Plan / ED Course  I have reviewed the triage vital signs and the nursing notes.  Pertinent labs & imaging results that were available during my care of the patient were  reviewed by me and considered in my medical decision making (see chart for details).  Clinical Course as of Jul 20 1104  Tue Jul 19, 2017  1659  Identified no obvious focal deficits on my exam.  The fact that he is waxing and waning in his symptoms he felt it was reasonable to activate a stroke activation so we can get some urgent imaging and neurology involved.  Patient's blood pressures are not excessively high at 161/88.    [MB]  1722 Discussed with Dr. Margo Aye neuroradiology who felt that the imaging was negative for any sign of stroke.  Tele-neurology is ongoing in the room with the patient currently.   [MB]  1750 Discussed with tele-neurology Dr. Patrici Ranks.  She feels this was most likely a TIA and recommends  that he be admitted for continued workup of this.  I reviewed this with the patient and he is reluctant but agreeable to stay.  He understands he will need to be transferred to [facility] for further evaluation.  I have paged the hospitalist on-call for admission.   [MB]  1806 Patient's troponin 0.00.  Lab was having some difficulty with the information crossing over to the computer but he refused his CBC and his chemistry and LFTs and there were no significant findings.  I did place a call into the hospitalist but got a call back from Dr. Amada Jupiter neurology.  It sounds like there was some mixup with the stroke activation and he recommended that when the patient be transferred over there will consult neurology as needed.   [MB]  [facility]13 [facility]   [MB]    Clinical Course User Index [MB] Terrilee Files, MD     Final Clinical Impressions(s) / ED Diagnoses   Final diagnoses:  TIA (transient ischemic attack)    ED Discharge Orders    None      Terrilee Files, MD 07/20/17 1108

## 2017-07-20 ENCOUNTER — Observation Stay (HOSPITAL_BASED_OUTPATIENT_CLINIC_OR_DEPARTMENT_OTHER): Payer: Managed Care, Other (non HMO)

## 2017-07-20 ENCOUNTER — Observation Stay (HOSPITAL_COMMUNITY): Payer: Managed Care, Other (non HMO)

## 2017-07-20 ENCOUNTER — Encounter (HOSPITAL_COMMUNITY): Payer: Self-pay | Admitting: Internal Medicine

## 2017-07-20 DIAGNOSIS — G459 Transient cerebral ischemic attack, unspecified: Secondary | ICD-10-CM | POA: Diagnosis not present

## 2017-07-20 DIAGNOSIS — I10 Essential (primary) hypertension: Secondary | ICD-10-CM | POA: Diagnosis not present

## 2017-07-20 HISTORY — PX: TRANSTHORACIC ECHOCARDIOGRAM: SHX275

## 2017-07-20 LAB — CBC
HEMATOCRIT: 44.2 % (ref 39.0–52.0)
Hemoglobin: 15 g/dL (ref 13.0–17.0)
MCH: 34.1 pg — AB (ref 26.0–34.0)
MCHC: 33.9 g/dL (ref 30.0–36.0)
MCV: 100.5 fL — ABNORMAL HIGH (ref 78.0–100.0)
Platelets: 141 10*3/uL — ABNORMAL LOW (ref 150–400)
RBC: 4.4 MIL/uL (ref 4.22–5.81)
RDW: 13.9 % (ref 11.5–15.5)
WBC: 5.9 10*3/uL (ref 4.0–10.5)

## 2017-07-20 LAB — LIPID PANEL
Cholesterol: 247 mg/dL — ABNORMAL HIGH (ref 0–200)
HDL: 47 mg/dL (ref >40)
LDL CALC: 155 mg/dL — AB (ref 0–99)
Total CHOL/HDL Ratio: 5.3 RATIO
Triglycerides: 223 mg/dL — ABNORMAL HIGH (ref <150)
VLDL: 45 mg/dL — AB (ref 0–40)

## 2017-07-20 LAB — ECHOCARDIOGRAM COMPLETE
Height: 72 in
Weight: 3876.57 oz

## 2017-07-20 LAB — COMPREHENSIVE METABOLIC PANEL
ALBUMIN: 3.6 g/dL (ref 3.5–5.0)
ALT: 65 U/L — ABNORMAL HIGH (ref 17–63)
AST: 49 U/L — ABNORMAL HIGH (ref 15–41)
Alkaline Phosphatase: 39 U/L (ref 38–126)
Anion gap: 10 (ref 5–15)
BILIRUBIN TOTAL: 1.3 mg/dL — AB (ref 0.3–1.2)
BUN: 12 mg/dL (ref 6–20)
CO2: 25 mmol/L (ref 22–32)
Calcium: 9 mg/dL (ref 8.9–10.3)
Chloride: 103 mmol/L (ref 101–111)
Creatinine, Ser: 1.2 mg/dL (ref 0.61–1.24)
GFR calc non Af Amer: >60 mL/min (ref >60)
GLUCOSE: 114 mg/dL — AB (ref 65–99)
POTASSIUM: 3.9 mmol/L (ref 3.5–5.1)
SODIUM: 138 mmol/L (ref 135–145)
TOTAL PROTEIN: 6.5 g/dL (ref 6.5–8.1)

## 2017-07-20 LAB — HEMOGLOBIN A1C
HEMOGLOBIN A1C: 5.9 % — AB (ref 4.8–5.6)
MEAN PLASMA GLUCOSE: 122.63 mg/dL

## 2017-07-20 LAB — HIV ANTIBODY (ROUTINE TESTING W REFLEX): HIV SCREEN 4TH GENERATION: NONREACTIVE

## 2017-07-20 MED ORDER — ASPIRIN 325 MG PO TABS
325.0000 mg | ORAL_TABLET | Freq: Every day | ORAL | Status: DC
  Filled 2017-07-20: qty 1

## 2017-07-20 MED ORDER — ACETAMINOPHEN 160 MG/5ML PO SOLN: 650.0000 mg | ORAL | Status: DC | PRN

## 2017-07-20 MED ORDER — CLOPIDOGREL BISULFATE 75 MG PO TABS: 75.0000 mg | ORAL_TABLET | Freq: Every day | ORAL | 0 refills | Status: DC

## 2017-07-20 MED ORDER — ASPIRIN 300 MG RE SUPP: 300.0000 mg | Freq: Every day | RECTAL | Status: DC

## 2017-07-20 MED ORDER — LISINOPRIL 20 MG PO TABS
40.0000 mg | ORAL_TABLET | Freq: Every day | ORAL | Status: DC
  Administered 2017-07-20: 40 mg via ORAL
  Filled 2017-07-20: qty 2

## 2017-07-20 MED ORDER — CLOPIDOGREL BISULFATE 75 MG PO TABS
75.0000 mg | ORAL_TABLET | Freq: Every day | ORAL | Status: DC
  Administered 2017-07-20: 75 mg via ORAL
  Filled 2017-07-20: qty 1

## 2017-07-20 MED ORDER — STROKE: EARLY STAGES OF RECOVERY BOOK
Freq: Once | Status: AC
  Administered 2017-07-20: 06:00:00

## 2017-07-20 MED ORDER — ACETAMINOPHEN 325 MG PO TABS: 650.0000 mg | ORAL_TABLET | ORAL | Status: DC | PRN

## 2017-07-20 MED ORDER — ENOXAPARIN SODIUM 40 MG/0.4ML ~~LOC~~ SOLN
40.0000 mg | SUBCUTANEOUS | Status: DC
  Administered 2017-07-20: 40 mg via SUBCUTANEOUS
  Filled 2017-07-20: qty 0.4

## 2017-07-20 MED ORDER — SODIUM CHLORIDE 0.9 % IV SOLN
INTRAVENOUS | Status: DC
  Administered 2017-07-20: 05:00:00 via INTRAVENOUS

## 2017-07-20 MED ORDER — ASPIRIN EC 81 MG PO TBEC
81.0000 mg | DELAYED_RELEASE_TABLET | Freq: Every day | ORAL | Status: DC
Start: 2017-07-20 — End: 2017-07-20
  Administered 2017-07-20: 81 mg via ORAL
  Filled 2017-07-20 (×2): qty 1

## 2017-07-20 MED ORDER — ATORVASTATIN CALCIUM 80 MG PO TABS: 80.0000 mg | ORAL_TABLET | Freq: Every day | ORAL | 0 refills | Status: DC

## 2017-07-20 MED ORDER — ATORVASTATIN CALCIUM 80 MG PO TABS
80.0000 mg | ORAL_TABLET | Freq: Every day | ORAL | Status: DC
  Administered 2017-07-20: 80 mg via ORAL
  Filled 2017-07-20: qty 1

## 2017-07-20 MED ORDER — ACETAMINOPHEN 650 MG RE SUPP: 650.0000 mg | RECTAL | Status: DC | PRN

## 2017-07-20 NOTE — Evaluation (Signed)
Physical Therapy Evaluation and Discharge Patient Details Name: Jonathan Graves MRN: [id number] DOB: 1961/04/16 Today's Date: 07/20/2017   History of Present Illness  Pt is a 57 y/o male who presents with difficulty speaking, LUE heaviness, and facial numbness with resolution of symptoms in ED. CT snd MRI negative for acute changes. PMH significant for HTN, chronic renal insufficiency, R biceps tendon repair 2016.   Clinical Impression  Patient evaluated by Physical Therapy with no further acute PT needs identified. All education has been completed and the patient has no further questions. At the time of PT eval pt demonstrated functional independence with bed mobility, transfers, ambulation, and stair negotiation. He did report mild dizziness with diagonal head turns up to the L and down to the R while ambulating, however no unsteadiness or LOB was noted. Pt reports he is at his baseline of function and is safe to return home from a PT perspective. See below for any follow-up Physical Therapy or equipment needs. PT is signing off. Thank you for this referral.     Follow Up Recommendations No PT follow up;Supervision - Intermittent    Equipment Recommendations  None recommended by PT    Recommendations for Other Services       Precautions / Restrictions Precautions Precautions: None Restrictions Weight Bearing Restrictions: No      Mobility  Bed Mobility Overal bed mobility: Independent                Transfers Overall transfer level: Independent Equipment used: None                Ambulation/Gait Ambulation/Gait assistance: Independent Ambulation Distance (Feet): 500 Feet Assistive device: None Gait Pattern/deviations: WFL(Within Functional Limits) Gait velocity: WFL Gait velocity interpretation: >2.62 ft/sec, indicative of independent community ambulator General Gait Details: No unsteadiness or LOB noted. Pt ambulating at baseline level.   Stairs Stairs:  Yes Stairs assistance: Independent Stair Management: No rails;Alternating pattern;Forwards Number of Stairs: 10 General stair comments: No unsteadiness or LOB noted. Pt demonstrated a reciprocal gait pattern on stairs.   Wheelchair Mobility    Modified Rankin (Stroke Patients Only) Modified Rankin (Stroke Patients Only) Pre-Morbid Rankin Score: No symptoms Modified Rankin: No symptoms     Balance Overall balance assessment: Independent                             High Level Balance Comments: With head turns, pt was able to demonstrate vertical head turns and horizontal head turns without difficulty. With diagonal up to the R, down to the L pt had no symptoms. With Looking up to the L and down to the R, pt reports slight dizziness but no LOB noted.              Pertinent Vitals/Pain Pain Assessment: No/denies pain    Home Living Family/patient expects to be discharged to:: Private residence Living Arrangements: Spouse/significant other Available Help at Discharge: Family;Available PRN/intermittently Type of Home: House Home Access: Level entry     Home Layout: Two level;Able to live on main level with bedroom/bathroom Home Equipment: None      Prior Function Level of Independence: Independent         Comments: Works in [employer]. Travels from office to office a lot during the day and out of town approx once a month.     Hand Dominance   Dominant Hand: Right    Extremity/Trunk Assessment   Upper Extremity Assessment Upper  Extremity Assessment: Overall WFL for tasks assessed;LUE deficits/detail LUE Deficits / Details: Pt reports slight numbness in L tips of middle and ring fingers at beginning of session however once we began examination he reports no sensation differences.  LUE Sensation: WNL    Lower Extremity Assessment Lower Extremity Assessment: Overall WFL for tasks assessed;LLE deficits/detail LLE Deficits / Details: Baseline mild hamstring  weakness from prior hamstring muscle tear. Otherwise WFL. Grossly 4/5    Cervical / Trunk Assessment Cervical / Trunk Assessment: Normal  Communication   Communication: No difficulties  Cognition Arousal/Alertness: Awake/alert Behavior During Therapy: WFL for tasks assessed/performed Overall Cognitive Status: Within Functional Limits for tasks assessed                                        General Comments      Exercises     Assessment/Plan    PT Assessment Patent does not need any further PT services  PT Problem List         PT Treatment Interventions      PT Goals (Current goals can be found in the Care Plan section)  Acute Rehab PT Goals Patient Stated Goal: Home today, back to work tomorrow PT Goal Formulation: All assessment and education complete, DC therapy    Frequency     Barriers to discharge        Co-evaluation               AM-PAC PT "6 Clicks" Daily Activity  Outcome Measure Difficulty turning over in bed (including adjusting bedclothes, sheets and blankets)?: None Difficulty moving from lying on back to sitting on the side of the bed? : None Difficulty sitting down on and standing up from a chair with arms (e.g., wheelchair, bedside commode, etc,.)?: None Help needed moving to and from a bed to chair (including a wheelchair)?: None Help needed walking in hospital room?: None Help needed climbing 3-5 steps with a railing? : None 6 Click Score: 24    End of Session   Activity Tolerance: Patient tolerated treatment well Patient left: in chair;with call bell/phone within reach Nurse Communication: Mobility status PT Visit Diagnosis: Other symptoms and signs involving the nervous system (R29.898)    Time: 1610-9604 PT Time Calculation (min) (ACUTE ONLY): 21 min   Charges:   PT Evaluation $PT Eval Low Complexity: 1 Low     PT G Codes:        Conni Slipper, PT, DPT Acute Rehabilitation Services Pager: [phone]    Marylynn Pearson 07/20/2017, 1:11 PM

## 2017-07-20 NOTE — Progress Notes (Addendum)
STROKE TEAM PROGRESS NOTE   INTERVAL HISTORY No family is at the bedside.  He recounted HPI.he states his symptoms began around 12:30yesterday with initially numbness subsequently trouble speaking as well as left upper extremity heaviness and weakness. He has hx of numbness in finger tips but no other stroke or TIA past hx. He has remained normal since being in ED yesterday.  CBC:  CBC Latest Ref Rng & Units 07/20/2017 07/19/2017 05/16/2017  WBC 4.0 - 10.5 K/uL 5.9 7.3 5.4  Hemoglobin 13.0 - 17.0 g/dL 16.1 09.6 04.5  Hematocrit 39.0 - 52.0 % 44.2 44.0 46.2  Platelets 150 - 400 K/uL 141(L) 162 182.0     Comprehensive Metabolic Panel:   CMP Latest Ref Rng & Units 07/20/2017 07/19/2017 05/16/2017  Glucose 65 - 99 mg/dL 409(W) 119(J) 478(G)  BUN 6 - 20 mg/dL 12 19 16   Creatinine 0.61 - 1.24 mg/dL 9.56 2.13(Y) 8.65  Sodium 135 - 145 mmol/L 138 136 141  Potassium 3.5 - 5.1 mmol/L 3.9 3.7 4.2  Chloride 101 - 111 mmol/L 103 102 105  CO2 22 - 32 mmol/L 25 23 26   Calcium 8.9 - 10.3 mg/dL 9.0 9.2 9.3  Total Protein 6.5 - 8.1 g/dL 6.5 7.8 7.2  Total Bilirubin 0.3 - 1.2 mg/dL 7.8(I) 0.8 0.7  Alkaline Phos 38 - 126 U/L 39 38 37(L)  AST 15 - 41 U/L 49(H) 50(H) 28  ALT 17 - 63 U/L 65(H) 65(H) 45   Lipid Panel:     Component Value Date/Time   CHOL 247 (H) 07/20/2017 0415   TRIG 223 (H) 07/20/2017 0415   HDL 47 07/20/2017 0415   CHOLHDL 5.3 07/20/2017 0415   VLDL 45 (H) 07/20/2017 0415   LDLCALC 155 (H) 07/20/2017 0415   HgbA1c:  Lab Results  Component Value Date   HGBA1C 5.9 (H) 07/20/2017   Urine Drug Screen: No results found for: LABOPIA, COCAINSCRNUR, LABBENZ, AMPHETMU, THCU, LABBARB  Alcohol Level No results found for: ETH  Ct Angio Head W Or Wo Contrast  Result Date: 07/19/2017 CLINICAL DATA:  Blurred vision, left arm numbness and difficulty speaking. EXAM: CT ANGIOGRAPHY HEAD AND NECK TECHNIQUE: Multidetector CT imaging of the head and neck was performed using the standard protocol during  bolus administration of intravenous contrast. Multiplanar CT image reconstructions and MIPs were obtained to evaluate the vascular anatomy. Carotid stenosis measurements (when applicable) are obtained utilizing NASCET criteria, using the distal internal carotid diameter as the denominator. CONTRAST:  50mL ISOVUE-370 IOPAMIDOL (ISOVUE-370) INJECTION 76% COMPARISON:  Head CT 07/19/2017 FINDINGS: CTA NECK FINDINGS AORTIC ARCH: There is mild calcific atherosclerosis of the aortic arch. There is no aneurysm, dissection or hemodynamically significant stenosis of the visualized ascending aorta and aortic arch. Conventional 3 vessel aortic branching pattern. The visualized proximal subclavian arteries are widely patent. RIGHT CAROTID SYSTEM: --Common carotid artery: Widely patent origin without common carotid artery dissection or aneurysm. --Internal carotid artery: Mixed density plaque at the bifurcation with noncalcified plaque extending into the proximal left internal carotid artery with less than 50% stenosis. --External carotid artery: No acute abnormality. LEFT CAROTID SYSTEM: --Common carotid artery: Widely patent origin without common carotid artery dissection or aneurysm. --Internal carotid artery:No dissection, occlusion or aneurysm. Mild atherosclerotic calcification at the carotid bifurcation without hemodynamically significant stenosis. --External carotid artery: No acute abnormality. VERTEBRAL ARTERIES: Right dominant configuration. Both origins are normal. No dissection, occlusion or flow-limiting stenosis to the vertebrobasilar confluence. SKELETON: There is no bony spinal canal stenosis. No lytic or blastic  lesion. OTHER NECK: Normal pharynx, larynx and major salivary glands. No cervical lymphadenopathy. Unremarkable thyroid gland. UPPER CHEST: No pneumothorax or pleural effusion. No nodules or masses. CTA HEAD FINDINGS ANTERIOR CIRCULATION: --Intracranial internal carotid arteries: Atherosclerotic  calcification of the internal carotid arteries at the skull base without hemodynamically significant stenosis. --Anterior cerebral arteries: Normal. Both A1 segments are present. Patent anterior communicating artery. --Middle cerebral arteries: Normal. --Posterior communicating arteries: Absent bilaterally. POSTERIOR CIRCULATION: --Basilar artery: Normal. --Posterior cerebral arteries: Normal. --Superior cerebellar arteries: Normal. --Inferior cerebellar arteries: Normal anterior and posterior inferior cerebellar arteries. VENOUS SINUSES: Diminutive left transverse sinus is a normal congenital variant. ANATOMIC VARIANTS: None DELAYED PHASE: No parenchymal contrast enhancement. Review of the MIP images confirms the above findings. IMPRESSION: 1. No aneurysm, occlusion or hemodynamically significant stenosis of the arteries of the head and neck. No arterial malformation or evidence for vasculitis. 2. Bilateral mixed density carotid bifurcation/proximal ICA atherosclerosis without hemodynamically significant stenosis. 3.  Aortic Atherosclerosis (ICD10-I70.0). Electronically Signed   By: Deatra RobinsonKevin  Herman M.D.   On: 07/19/2017 22:55   Ct Angio Neck W Or Wo Contrast  Result Date: 07/19/2017 CLINICAL DATA:  Blurred vision, left arm numbness and difficulty speaking. EXAM: CT ANGIOGRAPHY HEAD AND NECK TECHNIQUE: Multidetector CT imaging of the head and neck was performed using the standard protocol during bolus administration of intravenous contrast. Multiplanar CT image reconstructions and MIPs were obtained to evaluate the vascular anatomy. Carotid stenosis measurements (when applicable) are obtained utilizing NASCET criteria, using the distal internal carotid diameter as the denominator. CONTRAST:  50mL ISOVUE-370 IOPAMIDOL (ISOVUE-370) INJECTION 76% COMPARISON:  Head CT 07/19/2017 FINDINGS: CTA NECK FINDINGS AORTIC ARCH: There is mild calcific atherosclerosis of the aortic arch. There is no aneurysm, dissection or  hemodynamically significant stenosis of the visualized ascending aorta and aortic arch. Conventional 3 vessel aortic branching pattern. The visualized proximal subclavian arteries are widely patent. RIGHT CAROTID SYSTEM: --Common carotid artery: Widely patent origin without common carotid artery dissection or aneurysm. --Internal carotid artery: Mixed density plaque at the bifurcation with noncalcified plaque extending into the proximal left internal carotid artery with less than 50% stenosis. --External carotid artery: No acute abnormality. LEFT CAROTID SYSTEM: --Common carotid artery: Widely patent origin without common carotid artery dissection or aneurysm. --Internal carotid artery:No dissection, occlusion or aneurysm. Mild atherosclerotic calcification at the carotid bifurcation without hemodynamically significant stenosis. --External carotid artery: No acute abnormality. VERTEBRAL ARTERIES: Right dominant configuration. Both origins are normal. No dissection, occlusion or flow-limiting stenosis to the vertebrobasilar confluence. SKELETON: There is no bony spinal canal stenosis. No lytic or blastic lesion. OTHER NECK: Normal pharynx, larynx and major salivary glands. No cervical lymphadenopathy. Unremarkable thyroid gland. UPPER CHEST: No pneumothorax or pleural effusion. No nodules or masses. CTA HEAD FINDINGS ANTERIOR CIRCULATION: --Intracranial internal carotid arteries: Atherosclerotic calcification of the internal carotid arteries at the skull base without hemodynamically significant stenosis. --Anterior cerebral arteries: Normal. Both A1 segments are present. Patent anterior communicating artery. --Middle cerebral arteries: Normal. --Posterior communicating arteries: Absent bilaterally. POSTERIOR CIRCULATION: --Basilar artery: Normal. --Posterior cerebral arteries: Normal. --Superior cerebellar arteries: Normal. --Inferior cerebellar arteries: Normal anterior and posterior inferior cerebellar arteries.  VENOUS SINUSES: Diminutive left transverse sinus is a normal congenital variant. ANATOMIC VARIANTS: None DELAYED PHASE: No parenchymal contrast enhancement. Review of the MIP images confirms the above findings. IMPRESSION: 1. No aneurysm, occlusion or hemodynamically significant stenosis of the arteries of the head and neck. No arterial malformation or evidence for vasculitis. 2. Bilateral mixed density carotid bifurcation/proximal ICA atherosclerosis  without hemodynamically significant stenosis. 3.  Aortic Atherosclerosis (ICD10-I70.0). Electronically Signed   By: Deatra Robinson M.D.   On: 07/19/2017 22:55   Mr Brain Wo Contrast  Result Date: 07/20/2017 CLINICAL DATA:  Vision changes.  Left arm numbness and weakness. EXAM: MRI HEAD WITHOUT CONTRAST TECHNIQUE: Multiplanar, multiecho pulse sequences of the brain and surrounding structures were obtained without intravenous contrast. COMPARISON:  None. FINDINGS: BRAIN: The midline structures are normal. There is no acute infarct or acute hemorrhage. No mass lesion, hydrocephalus, dural abnormality or extra-axial collection. The brain parenchymal signal is normal for the patient's age. No age-advanced or lobar predominant atrophy. No chronic microhemorrhage or superficial siderosis. VASCULAR: Major intracranial arterial and venous sinus flow voids are preserved. SKULL AND UPPER CERVICAL SPINE: The visualized skull base, calvarium, upper cervical spine and extracranial soft tissues are normal. SINUSES/ORBITS: No fluid levels or advanced mucosal thickening. No mastoid or middle ear effusion. Normal orbits. IMPRESSION: Normal brain. Electronically Signed   By: Deatra Robinson M.D.   On: 07/20/2017 04:03   Ct Head Code Stroke Wo Contrast  Result Date: 07/19/2017 CLINICAL DATA:  Code stroke. 57 year old male with onset of blurred vision, facial numbness and left arm numbness at lunch today. Transient symptoms initially, but recurred at 1500 hours with abnormal speech.  EXAM: CT HEAD WITHOUT CONTRAST TECHNIQUE: Contiguous axial images were obtained from the base of the skull through the vertex without intravenous contrast. COMPARISON:  None. FINDINGS: Brain: No midline shift, ventriculomegaly, mass effect, evidence of mass lesion, intracranial hemorrhage or evidence of cortically based acute infarction. Gray-white matter differentiation is within normal limits throughout the brain. No encephalomalacia identified. Vascular: Calcified atherosclerosis at the skull base. No suspicious intracranial vascular hyperdensity. Skull: Negative. Sinuses/Orbits: Clear. Other: Visualized orbits and scalp soft tissues are within normal limits. ASPECTS [facility] Stroke Program Early CT Score) - Ganglionic level infarction (caudate, lentiform nuclei, internal capsule, insula, M1-M3 cortex): 7 - Supraganglionic infarction (M4-M6 cortex): 3 Total score (0-10 with 10 being normal): 10 IMPRESSION: 1. Negative noncontrast CT appearance of the brain. 2. ASPECTS is 10. 3. Study discussed by telephone with Dr. Meridee Score on 07/19/2017 at 17:09 . Electronically Signed   By: Odessa Fleming M.D.   On: 07/19/2017 17:10     Vitals:   07/19/17 2051 07/19/17 2342 07/20/17 0438 07/20/17 0821  BP: (!) 162/85 (!) 169/94 (!) 145/80 135/78  Pulse: (!) 57 (!) 59 (!) 57 61  Resp: 18 18 18 15   Temp: 98.7 F (37.1 C) 98 F (36.7 C) 97.8 F (36.6 C) 98.2 F (36.8 C)  TempSrc: Oral Oral Oral Oral  SpO2: 98% 97% 96% 93%  Weight:      Height:       GENERAL: Awake, alert in NAD HEENT: - Normocephalic and atraumatic, dry LUNGS - Clear to auscultation bilaterally with no wheezes CV - S1S2 RRR, no m/r/g, equal pulses bilaterally. Ext: warm, well perfused, intact peripheral pulses, no edema  NEURO:  Mental Status: AA&Ox3  Language: speech is clear.  Naming, repetition, fluency, and comprehension intact. Cranial Nerves: PERRL. EOMI, visual fields full, no facial asymmetry, facial sensation intact, hearing  intact, tongue/uvula/soft palate midline, normal sternocleidomastoid and trapezius muscle strength. No evidence of tongue atrophy or fibrillations Motor: 5/5 all over with no vertical drift noted. Tone: is normal and bulk is normal Sensation- Intact to light touch bilaterally, with no extinction Coordination: FTN intact bilaterally, no ataxia in BLE. Gait- deferred  NIHSS -0 ABCD2 score 4 but had unilateral weaknes  No change in neuro exam since yesterday  ASSESSMENT/PLAN Jonathan Graves is a 57 y.o. male with history of CKD, HTN, obesity, fatty liver and impaired glucose tolerance presenting with L face/jaw numbness, vision changes and difficulty getting words out.   TIA R brain  Code Stroke CT head No acute stroke. Aspects 10.     CTA head normal  CTA neck B ICA atherosclerosis w/o sign stenosis, aortic atherosclerosis  MRI  normal  2D Echo  pending   ABCD2 score:  4 points  Lovenox 40 mg sq daily for VTE prophylaxis  Fall precautions  Fall precautions  Diet Heart Room service appropriate? Yes; Fluid consistency: Thin  aspirin 81 mg daily prior to admission, now on aspirin 325 mg daily. Given TIA in setting of will place on aspirin 81 mg and plavix 75 mg daily x 3 weeks, then aspirin alone. Orders adjusted.   Therapy recommendations:  No therapy needs  Disposition:  Return home  Dr. Pearlean Brownie discussed Axiomatic research trial with patient. Patient considering participation in the axiomatic trial. [facility] will follow up for possible enrollment. Please contact them at [phone] for any questions.     Hypertension  Elevated 140s this am but normalizing . BP goal normotensive  Hyperlipidemia  Home meds:  No statin (prescribed but has not started taking)  Started on lipitor 80 in hospital  LDL 155, goal < 70  Continue statin at discharge  Glucose Intolerance  HgbA1c 5.9, goal < 7.0  Controlled  Other Stroke Risk  Factors  Former Cigarette smoker  ETOH use, advised to only drink 2 or less per day  Obesity, Body mass index is 32.86 kg/m.  Family hx stroke (paternal grandmother)  Other Active Problems  CKD stage 2  Fatty liver  Hospital day # 0  Annie Main, MSN, APRN, ANVP-BC, AGPCNP-BC Advanced Practice Stroke Nurse [facility] See Amion for Schedule & Pager information 07/20/2017 9:27 AM  I have personally examined this patient, reviewed notes, independently viewed imaging studies, participated in medical decision making and plan of care.ROS completed by me personally and pertinent positives fully documented  I have made any additions or clarifications directly to the above note. Agree with note above. he presented with left face and arm paresthesias, blurred vision, speech difficulties and left upper extremity weakness symptoms seemed to have resolved. Brain imaging is negative for acute stroke. He remains at risk for recurrent stroke and TIAs and will benefit with dual antiplatelet therapy for 3 weeks followed by single agent alone. He may also consider possible participation in the AXIOMATIC stroke prevention trial if interested. He was given written information to review and decide. No family available at the bedside for discussion. Greater than 50% time during this 35 minute visit was spent on counseling and coordination of care about stroke and TIA risk, discussion about evaluation and treatment plan and answered questions.  Delia Heady, MD Medical Director [facility] Pager: [phone] 07/20/2017 10:58 AM  To contact Stroke Continuity provider, please refer to [email or web address]. After hours, contact General Neurology

## 2017-07-20 NOTE — Evaluation (Addendum)
Occupational Therapy Evaluation Patient Details Name: Jonathan Graves MRN: [id number] DOB: [birth year] Today's Date: 07/20/2017    History of Present Illness Pt is a 57 y/o male who presents with blurred vision and facial numbness with resolution of symptoms. CT snd MRI negative for acute changes. PMH significant for HTN, chronic renal insufficiency, R biceps tendon repair 2016.    Clinical Impression   This 57 y/o M presents with the above. At baseline Pt is independent with ADLs, iADLs and functional mobility. Pt completing room and hallway level functional mobility without AD and supervision throughout this session; demonstrating overall mod independence with ADL completion. Pt reports feeling back to baseline regarding ADL and functional mobility. Feel pt is safe to return home from OT standpoint once medically ready. No further acute OT needs identified at this time. OT referral is appreciated, will sign off.     Follow Up Recommendations  No OT follow up;Supervision - Intermittent    Equipment Recommendations  None recommended by OT           Precautions / Restrictions Precautions Precautions: None Restrictions Weight Bearing Restrictions: No      Mobility Bed Mobility               General bed mobility comments: OOB in recliner upon arrival   Transfers Overall transfer level: Independent Equipment used: None                  Balance Overall balance assessment: No apparent balance deficits (not formally assessed)                                         ADL either performed or assessed with clinical judgement   ADL Overall ADL's : At baseline                                       General ADL Comments: Pt completed room and hallway level functional mobility without AD and with supervision throughout; pt reports feeling at his baseline regarding ADL completion      Vision Baseline Vision/History: Wears glasses Wears  Glasses: Reading only Patient Visual Report: No change from baseline Vision Assessment?: Yes;No apparent visual deficits Eye Alignment: Within Functional Limits Ocular Range of Motion: Within Functional Limits Tracking/Visual Pursuits: Able to track stimulus in all quads without difficulty Visual Fields: No apparent deficits                Pertinent Vitals/Pain Pain Assessment: No/denies pain     Hand Dominance Right   Extremity/Trunk Assessment Upper Extremity Assessment Upper Extremity Assessment: Overall WFL for tasks assessed;LUE deficits/detail LUE Deficits / Details: Pt reports numbness in L fingertips, reports this was present previously     Lower Extremity Assessment Lower Extremity Assessment: Defer to PT evaluation   Cervical / Trunk Assessment Cervical / Trunk Assessment: Normal   Communication Communication Communication: No difficulties   Cognition Arousal/Alertness: Awake/alert Behavior During Therapy: WFL for tasks assessed/performed Overall Cognitive Status: Within Functional Limits for tasks assessed  Home Living Family/patient expects to be discharged to:: Private residence Living Arrangements: Spouse/significant other Available Help at Discharge: Family;Available PRN/intermittently Type of Home: House Home Access: Level entry     Home Layout: Two level;Able to live on main level with bedroom/bathroom Alternate Level Stairs-Number of Steps: flight Alternate Level Stairs-Rails: Right Bathroom Shower/Tub: Tub/shower unit;Walk-in shower   Bathroom Toilet: Standard     Home Equipment: None          Prior Functioning/Environment Level of Independence: Independent        Comments: Works in [employer]. Travels from office to office a lot during the day and out of town approx once a month.        OT Problem List: Decreased activity tolerance            OT Goals(Current  goals can be found in the care plan section) Acute Rehab OT Goals Patient Stated Goal: Home today, back to work tomorrow OT Goal Formulation: All assessment and education complete, DC therapy                                 AM-PAC PT "6 Clicks" Daily Activity     Outcome Measure Help from another person eating meals?: None Help from another person taking care of personal grooming?: None Help from another person toileting, which includes using toliet, bedpan, or urinal?: None Help from another person bathing (including washing, rinsing, drying)?: None Help from another person to put on and taking off regular upper body clothing?: None Help from another person to put on and taking off regular lower body clothing?: None 6 Click Score: 24   End of Session Equipment Utilized During Treatment: Gait belt Nurse Communication: Mobility status  Activity Tolerance: Patient tolerated treatment well Patient left: in chair;with call bell/phone within reach  OT Visit Diagnosis: Other symptoms and signs involving the nervous system (R29.898)                Time: [id number] OT Time Calculation (min): 12 min Charges:  OT General Charges $OT Visit: 1 Visit OT Evaluation $OT Eval Low Complexity: 1 Low G-Codes:     Marcy SirenBreanna Trang Bouse, OT Pager [phone] 07/20/2017   [name] 07/20/2017, 2:42 PM

## 2017-07-20 NOTE — Care Management Note (Signed)
Case Management Note  Patient Details  Name: Patrecia PourCarl Mcandrew MRN: [id number] Date of Birth: [birth year]  Subjective/Objective:     Pt in with TIA. He is from home with his spouse.               Action/Plan: Pt discharging home with self care. Pt has PCP, insurance and transportation home.   Expected Discharge Date:  07/20/17               Expected Discharge Plan:  Home/Self Care  In-House Referral:     Discharge planning Services     Post Acute Care Choice:    Choice offered to:     DME Arranged:    DME Agency:     HH Arranged:    HH Agency:     Status of Service:  Completed, signed off  If discussed at [employer] Length of Stay Meetings, dates discussed:    Additional Comments:  Kermit BaloKelli F Aleene Swanner, RN 07/20/2017, 2:13 PM

## 2017-07-20 NOTE — H&P (Signed)
History and Physical    Arvind Mexicano ZOX:[id number] DOB: Mar 04, 1961 DOA: 07/19/2017  PCP: Jeoffrey Massed, MD  Patient coming from: Home.  Chief Complaint: Blurred vision.  HPI: Otho Michalik is a 57 y.o. male with history of hypertension, hyperlipidemia presents to the ER at [facility] with complaints of blurred vision.  Patient states he was in a fast food center around 12:30 PM yesterday when he started developing blurred vision in both eyes.  He felt something like forward in front of his eye.  Unable to focus.  At the same time patient also had numbness around his left side of jaw and left upper extremity.  Symptoms lasted for about half hour and resolved.  Patient started developing left jaw numbness again at 2:30 PM and at this point patient decided to come to the ER.  Denies any weakness of the upper or lower extremity or any difficulty swallowing or speaking.   ED Course: In the ER patient had CT head followed by CT angiogram of the head and neck which did not show anything acute.  Tele neurologist was consulted who requested admission for possible TIA and further workup.  On my exam patient is able to move all extremities.   Review of Systems: As per HPI, rest all negative.   Past Medical History:  Diagnosis Date  . Chronic renal insufficiency, stage 2 (mild) 2017   GFR 60s  . Diverticulitis   . Elevated transaminase level 2017   Mild: fatty liver on u/s 11/2015  . Granuloma annulare 02/20/2008   Dr. Everlene Farrier  . Hyperlipemia, mixed    TLC recommended 2017.  Statin recommended 04/2017 but pt declined.  . Hypertension   . IFG (impaired fasting glucose) 2017   gluc 131.  04/2017 fasting gluc 119, A1c 6.0%.  . Obesity, Class I, BMI 30-34.9     Past Surgical History:  Procedure Laterality Date  . BICEPS TENDON REPAIR Right    2'16 -Dr. Amanda Pea  . COLONOSCOPY  approx 2010   normal.  Recall 10 yrs.  Marland Kitchen HAMMER TOE SURGERY  2015  . LAPAROSCOPIC SIGMOID COLECTOMY N/A  01/31/2015   For diverticulitis.  Procedure: LAPAROSCOPIC SIGMOID COLECTOMY SPLENIC FLEXURE MOBILIZATION;  Surgeon: Axel Filler, MD;  Location: [facility];  Service: General;  Laterality: N/A;  . LIPOMA RESECTION  2009   Dr. Stephens November  . MOUTH SURGERY    . SMALL INTESTINE SURGERY  01/2015   Small bowel anastomosis developed after sigmoid colectomy --repair required.  Marland Kitchen VASECTOMY       reports that he has quit smoking. His smoking use included cigarettes. He smoked 0.25 packs per day. He has never used smokeless tobacco. He reports that he drinks about 4.8 oz of alcohol per week. He reports that he does not use drugs.  No Known Allergies  Family History  Problem Relation Age of Onset  . Gastric cancer Mother   . Breast cancer Mother   . Hypertension Father   . Diabetes Father   . Heart disease Father   . Early death Maternal Grandmother        MVA  . Early death Maternal Grandfather        MVA  . CVA Paternal Grandmother   . Alzheimer's disease Paternal Grandfather   . Colon cancer Neg Hx   . Liver disease Neg Hx     Prior to Admission medications   Medication Sig Start Date End Date Taking? Authorizing Provider  aspirin EC 81  MG tablet Take 81 mg by mouth every morning.     [provider]  FIBER PO Take 1 tablet by mouth every morning.     [provider]  ibuprofen (ADVIL,MOTRIN) 200 MG tablet Take 200 mg by mouth every 6 (six) hours as needed for moderate pain.    [provider]  lisinopril-hydrochlorothiazide (PRINZIDE,ZESTORETIC) 20-12.5 MG tablet 1.5 - 2 tabs po qd 05/16/17   McGowen, Maryjean Morn, MD  vitamin B-12 (CYANOCOBALAMIN) 1000 MCG tablet Take 1,000 mcg by mouth every morning.     [provider]    Physical Exam: Vitals:   07/19/17 1930 07/19/17 1946 07/19/17 2051 07/19/17 2342  BP: (!) 156/76 (!) 162/83 (!) 162/85 (!) 169/94  Pulse: (!) 50 61 (!) 57 (!) 59  Resp: 17 18 18 18   Temp:   98.7 F (37.1 C) 98 F (36.7 C)    TempSrc:   Oral Oral  SpO2: 100% 100% 98% 97%  Weight:      Height:          Constitutional: Moderately built and nourished. Vitals:   07/19/17 1930 07/19/17 1946 07/19/17 2051 07/19/17 2342  BP: (!) 156/76 (!) 162/83 (!) 162/85 (!) 169/94  Pulse: (!) 50 61 (!) 57 (!) 59  Resp: 17 18 18 18   Temp:   98.7 F (37.1 C) 98 F (36.7 C)  TempSrc:   Oral Oral  SpO2: 100% 100% 98% 97%  Weight:      Height:       Eyes: Anicteric no pallor. ENMT: No discharge from the ears eyes nose or mouth. Neck: No mass felt.  No neck rigidity. Respiratory: No rhonchi or crepitations. Cardiovascular: S1-S2 heard no murmurs appreciated. Abdomen: Soft nontender bowel sounds present. Musculoskeletal: No edema.  No joint effusion. Skin: No rash.  Skin appears warm. Neurologic: Alert awake oriented to time place and person.  Moves all extremities 5 x 5.  No facial asymmetry tongue is midline.  Pupils are equal and reacting to light. Psychiatric: Appears normal.  Normal affect.   Labs on Admission: I have personally reviewed following labs and imaging studies  CBC: Recent Labs  Lab 07/19/17 1650  WBC 7.3  NEUTROABS 2.6  HGB 15.5  HCT 44.0  MCV 99.1  PLT 162   Basic Metabolic Panel: Recent Labs  Lab 07/19/17 1650  NA 136  K 3.7  CL 102  CO2 23  GLUCOSE 100*  BUN 19  CREATININE 1.25*  CALCIUM 9.2   GFR: Estimated Creatinine Clearance: 83.5 mL/min (A) (by C-G formula based on SCr of 1.25 mg/dL (H)). Liver Function Tests: Recent Labs  Lab 07/19/17 1650  AST 50*  ALT 65*  ALKPHOS 38  BILITOT 0.8  PROT 7.8  ALBUMIN 4.3   No results for input(s): LIPASE, AMYLASE in the last 168 hours. No results for input(s): AMMONIA in the last 168 hours. Coagulation Profile: Recent Labs  Lab 07/19/17 1650  INR 0.89   Cardiac Enzymes: Recent Labs  Lab 07/19/17 1650  TROPONINI <0.03   BNP (last 3 results) No results for input(s): PROBNP in the last 8760 hours. HbA1C: No results  for input(s): HGBA1C in the last 72 hours. CBG: No results for input(s): GLUCAP in the last 168 hours. Lipid Profile: No results for input(s): CHOL, HDL, LDLCALC, TRIG, CHOLHDL, LDLDIRECT in the last 72 hours. Thyroid Function Tests: No results for input(s): TSH, T4TOTAL, FREET4, T3FREE, THYROIDAB in the last 72 hours. Anemia Panel: No results for input(s): VITAMINB12,  FOLATE, FERRITIN, TIBC, IRON, RETICCTPCT in the last 72 hours. Urine analysis:    Component Value Date/Time   COLORURINE YELLOW 11/04/2013 0234   APPEARANCEUR CLEAR 11/04/2013 0234   LABSPEC >1.046 (H) 11/04/2013 0234   PHURINE 6.0 11/04/2013 0234   GLUCOSEU NEGATIVE 11/04/2013 0234   HGBUR NEGATIVE 11/04/2013 0234   BILIRUBINUR NEGATIVE 11/04/2013 0234   KETONESUR NEGATIVE 11/04/2013 0234   PROTEINUR NEGATIVE 11/04/2013 0234   UROBILINOGEN 1.0 11/04/2013 0234   NITRITE NEGATIVE 11/04/2013 0234   LEUKOCYTESUR NEGATIVE 11/04/2013 0234   Sepsis Labs: @LABRCNTIP (procalcitonin:4,lacticidven:4) )No results found for this or any previous visit (from the past 240 hour(s)).   Radiological Exams on Admission: Ct Angio Head W Or Wo Contrast  Result Date: 07/19/2017 CLINICAL DATA:  Blurred vision, left arm numbness and difficulty speaking. EXAM: CT ANGIOGRAPHY HEAD AND NECK TECHNIQUE: Multidetector CT imaging of the head and neck was performed using the standard protocol during bolus administration of intravenous contrast. Multiplanar CT image reconstructions and MIPs were obtained to evaluate the vascular anatomy. Carotid stenosis measurements (when applicable) are obtained utilizing NASCET criteria, using the distal internal carotid diameter as the denominator. CONTRAST:  50mL ISOVUE-370 IOPAMIDOL (ISOVUE-370) INJECTION 76% COMPARISON:  Head CT 07/19/2017 FINDINGS: CTA NECK FINDINGS AORTIC ARCH: There is mild calcific atherosclerosis of the aortic arch. There is no aneurysm, dissection or hemodynamically significant stenosis of  the visualized ascending aorta and aortic arch. Conventional 3 vessel aortic branching pattern. The visualized proximal subclavian arteries are widely patent. RIGHT CAROTID SYSTEM: --Common carotid artery: Widely patent origin without common carotid artery dissection or aneurysm. --Internal carotid artery: Mixed density plaque at the bifurcation with noncalcified plaque extending into the proximal left internal carotid artery with less than 50% stenosis. --External carotid artery: No acute abnormality. LEFT CAROTID SYSTEM: --Common carotid artery: Widely patent origin without common carotid artery dissection or aneurysm. --Internal carotid artery:No dissection, occlusion or aneurysm. Mild atherosclerotic calcification at the carotid bifurcation without hemodynamically significant stenosis. --External carotid artery: No acute abnormality. VERTEBRAL ARTERIES: Right dominant configuration. Both origins are normal. No dissection, occlusion or flow-limiting stenosis to the vertebrobasilar confluence. SKELETON: There is no bony spinal canal stenosis. No lytic or blastic lesion. OTHER NECK: Normal pharynx, larynx and major salivary glands. No cervical lymphadenopathy. Unremarkable thyroid gland. UPPER CHEST: No pneumothorax or pleural effusion. No nodules or masses. CTA HEAD FINDINGS ANTERIOR CIRCULATION: --Intracranial internal carotid arteries: Atherosclerotic calcification of the internal carotid arteries at the skull base without hemodynamically significant stenosis. --Anterior cerebral arteries: Normal. Both A1 segments are present. Patent anterior communicating artery. --Middle cerebral arteries: Normal. --Posterior communicating arteries: Absent bilaterally. POSTERIOR CIRCULATION: --Basilar artery: Normal. --Posterior cerebral arteries: Normal. --Superior cerebellar arteries: Normal. --Inferior cerebellar arteries: Normal anterior and posterior inferior cerebellar arteries. VENOUS SINUSES: Diminutive left transverse  sinus is a normal congenital variant. ANATOMIC VARIANTS: None DELAYED PHASE: No parenchymal contrast enhancement. Review of the MIP images confirms the above findings. IMPRESSION: 1. No aneurysm, occlusion or hemodynamically significant stenosis of the arteries of the head and neck. No arterial malformation or evidence for vasculitis. 2. Bilateral mixed density carotid bifurcation/proximal ICA atherosclerosis without hemodynamically significant stenosis. 3.  Aortic Atherosclerosis (ICD10-I70.0). Electronically Signed   By: Deatra Robinson M.D.   On: 07/19/2017 22:55   Ct Angio Neck W Or Wo Contrast  Result Date: 07/19/2017 CLINICAL DATA:  Blurred vision, left arm numbness and difficulty speaking. EXAM: CT ANGIOGRAPHY HEAD AND NECK TECHNIQUE: Multidetector CT imaging of the head and neck was performed using the standard  protocol during bolus administration of intravenous contrast. Multiplanar CT image reconstructions and MIPs were obtained to evaluate the vascular anatomy. Carotid stenosis measurements (when applicable) are obtained utilizing NASCET criteria, using the distal internal carotid diameter as the denominator. CONTRAST:  50mL ISOVUE-370 IOPAMIDOL (ISOVUE-370) INJECTION 76% COMPARISON:  Head CT 07/19/2017 FINDINGS: CTA NECK FINDINGS AORTIC ARCH: There is mild calcific atherosclerosis of the aortic arch. There is no aneurysm, dissection or hemodynamically significant stenosis of the visualized ascending aorta and aortic arch. Conventional 3 vessel aortic branching pattern. The visualized proximal subclavian arteries are widely patent. RIGHT CAROTID SYSTEM: --Common carotid artery: Widely patent origin without common carotid artery dissection or aneurysm. --Internal carotid artery: Mixed density plaque at the bifurcation with noncalcified plaque extending into the proximal left internal carotid artery with less than 50% stenosis. --External carotid artery: No acute abnormality. LEFT CAROTID SYSTEM: --Common  carotid artery: Widely patent origin without common carotid artery dissection or aneurysm. --Internal carotid artery:No dissection, occlusion or aneurysm. Mild atherosclerotic calcification at the carotid bifurcation without hemodynamically significant stenosis. --External carotid artery: No acute abnormality. VERTEBRAL ARTERIES: Right dominant configuration. Both origins are normal. No dissection, occlusion or flow-limiting stenosis to the vertebrobasilar confluence. SKELETON: There is no bony spinal canal stenosis. No lytic or blastic lesion. OTHER NECK: Normal pharynx, larynx and major salivary glands. No cervical lymphadenopathy. Unremarkable thyroid gland. UPPER CHEST: No pneumothorax or pleural effusion. No nodules or masses. CTA HEAD FINDINGS ANTERIOR CIRCULATION: --Intracranial internal carotid arteries: Atherosclerotic calcification of the internal carotid arteries at the skull base without hemodynamically significant stenosis. --Anterior cerebral arteries: Normal. Both A1 segments are present. Patent anterior communicating artery. --Middle cerebral arteries: Normal. --Posterior communicating arteries: Absent bilaterally. POSTERIOR CIRCULATION: --Basilar artery: Normal. --Posterior cerebral arteries: Normal. --Superior cerebellar arteries: Normal. --Inferior cerebellar arteries: Normal anterior and posterior inferior cerebellar arteries. VENOUS SINUSES: Diminutive left transverse sinus is a normal congenital variant. ANATOMIC VARIANTS: None DELAYED PHASE: No parenchymal contrast enhancement. Review of the MIP images confirms the above findings. IMPRESSION: 1. No aneurysm, occlusion or hemodynamically significant stenosis of the arteries of the head and neck. No arterial malformation or evidence for vasculitis. 2. Bilateral mixed density carotid bifurcation/proximal ICA atherosclerosis without hemodynamically significant stenosis. 3.  Aortic Atherosclerosis (ICD10-I70.0). Electronically Signed   By: Deatra Robinson M.D.   On: 07/19/2017 22:55   Ct Head Code Stroke Wo Contrast  Result Date: 07/19/2017 CLINICAL DATA:  Code stroke. 57 year old male with onset of blurred vision, facial numbness and left arm numbness at lunch today. Transient symptoms initially, but recurred at 1500 hours with abnormal speech. EXAM: CT HEAD WITHOUT CONTRAST TECHNIQUE: Contiguous axial images were obtained from the base of the skull through the vertex without intravenous contrast. COMPARISON:  None. FINDINGS: Brain: No midline shift, ventriculomegaly, mass effect, evidence of mass lesion, intracranial hemorrhage or evidence of cortically based acute infarction. Gray-white matter differentiation is within normal limits throughout the brain. No encephalomalacia identified. Vascular: Calcified atherosclerosis at the skull base. No suspicious intracranial vascular hyperdensity. Skull: Negative. Sinuses/Orbits: Clear. Other: Visualized orbits and scalp soft tissues are within normal limits. ASPECTS [facility] Stroke Program Early CT Score) - Ganglionic level infarction (caudate, lentiform nuclei, internal capsule, insula, M1-M3 cortex): 7 - Supraganglionic infarction (M4-M6 cortex): 3 Total score (0-10 with 10 being normal): 10 IMPRESSION: 1. Negative noncontrast CT appearance of the brain. 2. ASPECTS is 10. 3. Study discussed by telephone with Dr. Meridee Score on 07/19/2017 at 17:09 . Electronically Signed   By: Althea Grimmer.D.  On: 07/19/2017 17:10    EKG: Independently reviewed.  Normal sinus rhythm with nonspecific IVCD.  Assessment/Plan Principal Problem:   TIA (transient ischemic attack) Active Problems:   Hypertension    1. TIA -appreciate neurology consult.  Patient is placed on aspirin and Lipitor.  Check hemoglobin A1c lipid panel.  Check MRI brain 2D echo.  Patient already had CT angiogram of the head and neck.  Physical therapy consult. 2. Hypertension -will allow for permissive hypertension.  We will hold  hydrochlorothiazide continue lisinopril. 3. History of hyperlipidemia -patient states he was prescribed medications for hyperlipidemia but has not taken previously.  Patient is on Lipitor at this time 80 mg.  Lipid panel is pending. 4. Chronic kidney disease stage II -follow metabolic panel.   DVT prophylaxis: Lovenox. Code Status: Full code. Family Communication: Discussed with patient. Disposition Plan: Home. Consults called: Neurology. Admission status: Observation.    Eduard ClosArshad N Mackenze Grandison MD [facility] Pager [phone].  If 7PM-7AM, please contact night-coverage [email or web address] Password [email or web address]  07/20/2017, 3:00 AM

## 2017-07-20 NOTE — Progress Notes (Signed)
Patient given discharge information and teaching, able o verbalize understanding and teach back. No new questions or concerns. IV and telemetry removed.   Pt transported off unit in wheelchair by staff. Wife to transport home

## 2017-07-20 NOTE — Progress Notes (Signed)
  Echocardiogram 2D Echocardiogram has been performed.  Celene SkeenVijay  Calven Gilkes 07/20/2017, 12:23 PM

## 2017-07-20 NOTE — Discharge Summary (Signed)
Physician Discharge Summary  Kenith Trickel ZOX:[id number] DOB: 1960-05-25 DOA: 07/19/2017  PCP: Jeoffrey Massed, MD  Admit date: 07/19/2017 Discharge date: 07/23/2017  Admitted From: Home Disposition: Home   Recommendations for Outpatient Follow-up:  1. Follow up with [facility] in 6 weeks  Home Health: None Equipment/Devices: None Discharge Condition: Stable CODE STATUS: Full Diet recommendation: Heart healthy  Brief/Interim Summary: Jonathan Graves is a 57 y.o. male with history of hypertension, hyperlipidemia presents to the ER at [facility] with complaints of blurred vision.  Patient states he was in a fast food center around 12:30 PM yesterday when he started developing blurred vision in both eyes.  He felt something like forward in front of his eye.  Unable to focus.  At the same time patient also had numbness around his left side of jaw and left upper extremity.  Symptoms lasted for about half hour and resolved.  Patient started developing left jaw numbness again at 2:30 PM and at this point patient decided to come to the ER.  Denies any weakness of the upper or lower extremity or any difficulty swallowing or speaking.   ED Course: In the ER patient had CT head followed by CT angiogram of the head and neck which did not show anything acute.  Tele neurologist was consulted who requested admission for possible TIA and further workup.  On my exam patient is able to move all extremities.   Hospital course: Symptoms remained resolved, further imaging was negative for acute stroke. He was placed on ASA, plavix, lipitor to reduce risk.  Discharge Diagnoses:  Principal Problem:   TIA (transient ischemic attack) Active Problems:   Hypertension  TIA - ASA + plavix x3 weeks, then plavix alone per neurology.  - Follow up with [facility] 6 weeks.   Hypertension: Continue home medications  History of hyperlipidemia:  - Started lipitor 80mg  (was prescribed statin in past but not taking) LDL  155.  Chronic kidney disease stage II: Monitor BMP   Discharge Instructions Discharge Instructions    Ambulatory referral to Neurology   Complete by:  As directed    An appointment is requested in approximately: 4 weeks   Diet - low sodium heart healthy   Complete by:  As directed    Discharge instructions   Complete by:  As directed    You were admitted for a stroke work up which was negative. Your symptoms have remained resolved and you are stable for discharge with the following recommendations: - Continue taking both aspirin 81mg  and plavix 75mg  daily for 3 weeks, then plavix alone (stop aspirin) - Continue taking lipitor to control cholesterol to reduce your risk of future TIA or stroke.  - Continue other medications as you were - Follow up with neurology, Dr. Pearlean Brownie. Call the number provided if you are not contacted by the office in the next week.  - Return for care immediately if symptoms return.   Increase activity slowly   Complete by:  As directed      Allergies as of 07/20/2017   No Known Allergies     Medication List    TAKE these medications   aspirin EC 81 MG tablet Take 81 mg by mouth every morning.   atorvastatin 80 MG tablet Commonly known as:  LIPITOR Take 1 tablet (80 mg total) by mouth daily at 6 PM.   clopidogrel 75 MG tablet Commonly known as:  PLAVIX Take 1 tablet (75 mg total) by mouth daily.   FIBER  PO Take 1 tablet by mouth every morning.   ibuprofen 200 MG tablet Commonly known as:  ADVIL,MOTRIN Take 200-400 mg by mouth every 6 (six) hours as needed for headache or moderate pain.   lisinopril-hydrochlorothiazide 20-12.5 MG tablet Commonly known as:  PRINZIDE,ZESTORETIC 1.5 - 2 tabs po qd What changed:    how much to take  how to take this  when to take this  additional instructions   vitamin B-12 1000 MCG tablet Commonly known as:  CYANOCOBALAMIN Take 1,000 mcg by mouth every morning.      Follow-up Information    McGowen,  Maryjean Morn, MD Follow up.   Specialty:  Family Medicine Contact information: [address] [place] [address] [phone]        Micki Riley, MD. Schedule an appointment as soon as possible for a visit in 3 week(s).   Specialties:  Neurology, Radiology Contact information: [address] [place] [address] [phone]          No Known Allergies  Consultations:  Neurology  Procedures/Studies: Ct Angio Head W Or Wo Contrast  Result Date: 07/19/2017 CLINICAL DATA:  Blurred vision, left arm numbness and difficulty speaking. EXAM: CT ANGIOGRAPHY HEAD AND NECK TECHNIQUE: Multidetector CT imaging of the head and neck was performed using the standard protocol during bolus administration of intravenous contrast. Multiplanar CT image reconstructions and MIPs were obtained to evaluate the vascular anatomy. Carotid stenosis measurements (when applicable) are obtained utilizing NASCET criteria, using the distal internal carotid diameter as the denominator. CONTRAST:  50mL ISOVUE-370 IOPAMIDOL (ISOVUE-370) INJECTION 76% COMPARISON:  Head CT 07/19/2017 FINDINGS: CTA NECK FINDINGS AORTIC ARCH: There is mild calcific atherosclerosis of the aortic arch. There is no aneurysm, dissection or hemodynamically significant stenosis of the visualized ascending aorta and aortic arch. Conventional 3 vessel aortic branching pattern. The visualized proximal subclavian arteries are widely patent. RIGHT CAROTID SYSTEM: --Common carotid artery: Widely patent origin without common carotid artery dissection or aneurysm. --Internal carotid artery: Mixed density plaque at the bifurcation with noncalcified plaque extending into the proximal left internal carotid artery with less than 50% stenosis. --External carotid artery: No acute abnormality. LEFT CAROTID SYSTEM: --Common carotid artery: Widely patent origin without common carotid artery dissection or aneurysm. --Internal carotid artery:No  dissection, occlusion or aneurysm. Mild atherosclerotic calcification at the carotid bifurcation without hemodynamically significant stenosis. --External carotid artery: No acute abnormality. VERTEBRAL ARTERIES: Right dominant configuration. Both origins are normal. No dissection, occlusion or flow-limiting stenosis to the vertebrobasilar confluence. SKELETON: There is no bony spinal canal stenosis. No lytic or blastic lesion. OTHER NECK: Normal pharynx, larynx and major salivary glands. No cervical lymphadenopathy. Unremarkable thyroid gland. UPPER CHEST: No pneumothorax or pleural effusion. No nodules or masses. CTA HEAD FINDINGS ANTERIOR CIRCULATION: --Intracranial internal carotid arteries: Atherosclerotic calcification of the internal carotid arteries at the skull base without hemodynamically significant stenosis. --Anterior cerebral arteries: Normal. Both A1 segments are present. Patent anterior communicating artery. --Middle cerebral arteries: Normal. --Posterior communicating arteries: Absent bilaterally. POSTERIOR CIRCULATION: --Basilar artery: Normal. --Posterior cerebral arteries: Normal. --Superior cerebellar arteries: Normal. --Inferior cerebellar arteries: Normal anterior and posterior inferior cerebellar arteries. VENOUS SINUSES: Diminutive left transverse sinus is a normal congenital variant. ANATOMIC VARIANTS: None DELAYED PHASE: No parenchymal contrast enhancement. Review of the MIP images confirms the above findings. IMPRESSION: 1. No aneurysm, occlusion or hemodynamically significant stenosis of the arteries of the head and neck. No arterial malformation or evidence for vasculitis. 2. Bilateral mixed density carotid  bifurcation/proximal ICA atherosclerosis without hemodynamically significant stenosis. 3.  Aortic Atherosclerosis (ICD10-I70.0). Electronically Signed   By: Deatra RobinsonKevin  Herman M.D.   On: 07/19/2017 22:55   Ct Angio Neck W Or Wo Contrast  Result Date: 07/19/2017 CLINICAL DATA:  Blurred  vision, left arm numbness and difficulty speaking. EXAM: CT ANGIOGRAPHY HEAD AND NECK TECHNIQUE: Multidetector CT imaging of the head and neck was performed using the standard protocol during bolus administration of intravenous contrast. Multiplanar CT image reconstructions and MIPs were obtained to evaluate the vascular anatomy. Carotid stenosis measurements (when applicable) are obtained utilizing NASCET criteria, using the distal internal carotid diameter as the denominator. CONTRAST:  50mL ISOVUE-370 IOPAMIDOL (ISOVUE-370) INJECTION 76% COMPARISON:  Head CT 07/19/2017 FINDINGS: CTA NECK FINDINGS AORTIC ARCH: There is mild calcific atherosclerosis of the aortic arch. There is no aneurysm, dissection or hemodynamically significant stenosis of the visualized ascending aorta and aortic arch. Conventional 3 vessel aortic branching pattern. The visualized proximal subclavian arteries are widely patent. RIGHT CAROTID SYSTEM: --Common carotid artery: Widely patent origin without common carotid artery dissection or aneurysm. --Internal carotid artery: Mixed density plaque at the bifurcation with noncalcified plaque extending into the proximal left internal carotid artery with less than 50% stenosis. --External carotid artery: No acute abnormality. LEFT CAROTID SYSTEM: --Common carotid artery: Widely patent origin without common carotid artery dissection or aneurysm. --Internal carotid artery:No dissection, occlusion or aneurysm. Mild atherosclerotic calcification at the carotid bifurcation without hemodynamically significant stenosis. --External carotid artery: No acute abnormality. VERTEBRAL ARTERIES: Right dominant configuration. Both origins are normal. No dissection, occlusion or flow-limiting stenosis to the vertebrobasilar confluence. SKELETON: There is no bony spinal canal stenosis. No lytic or blastic lesion. OTHER NECK: Normal pharynx, larynx and major salivary glands. No cervical lymphadenopathy. Unremarkable  thyroid gland. UPPER CHEST: No pneumothorax or pleural effusion. No nodules or masses. CTA HEAD FINDINGS ANTERIOR CIRCULATION: --Intracranial internal carotid arteries: Atherosclerotic calcification of the internal carotid arteries at the skull base without hemodynamically significant stenosis. --Anterior cerebral arteries: Normal. Both A1 segments are present. Patent anterior communicating artery. --Middle cerebral arteries: Normal. --Posterior communicating arteries: Absent bilaterally. POSTERIOR CIRCULATION: --Basilar artery: Normal. --Posterior cerebral arteries: Normal. --Superior cerebellar arteries: Normal. --Inferior cerebellar arteries: Normal anterior and posterior inferior cerebellar arteries. VENOUS SINUSES: Diminutive left transverse sinus is a normal congenital variant. ANATOMIC VARIANTS: None DELAYED PHASE: No parenchymal contrast enhancement. Review of the MIP images confirms the above findings. IMPRESSION: 1. No aneurysm, occlusion or hemodynamically significant stenosis of the arteries of the head and neck. No arterial malformation or evidence for vasculitis. 2. Bilateral mixed density carotid bifurcation/proximal ICA atherosclerosis without hemodynamically significant stenosis. 3.  Aortic Atherosclerosis (ICD10-I70.0). Electronically Signed   By: Deatra RobinsonKevin  Herman M.D.   On: 07/19/2017 22:55   Mr Brain Wo Contrast  Result Date: 07/20/2017 CLINICAL DATA:  Vision changes.  Left arm numbness and weakness. EXAM: MRI HEAD WITHOUT CONTRAST TECHNIQUE: Multiplanar, multiecho pulse sequences of the brain and surrounding structures were obtained without intravenous contrast. COMPARISON:  None. FINDINGS: BRAIN: The midline structures are normal. There is no acute infarct or acute hemorrhage. No mass lesion, hydrocephalus, dural abnormality or extra-axial collection. The brain parenchymal signal is normal for the patient's age. No age-advanced or lobar predominant atrophy. No chronic microhemorrhage or  superficial siderosis. VASCULAR: Major intracranial arterial and venous sinus flow voids are preserved. SKULL AND UPPER CERVICAL SPINE: The visualized skull base, calvarium, upper cervical spine and extracranial soft tissues are normal. SINUSES/ORBITS: No fluid levels or advanced mucosal thickening. No mastoid  or middle ear effusion. Normal orbits. IMPRESSION: Normal brain. Electronically Signed   By: Deatra Robinson M.D.   On: 07/20/2017 04:03   Ct Head Code Stroke Wo Contrast  Result Date: 07/19/2017 CLINICAL DATA:  Code stroke. 57 year old male with onset of blurred vision, facial numbness and left arm numbness at lunch today. Transient symptoms initially, but recurred at 1500 hours with abnormal speech. EXAM: CT HEAD WITHOUT CONTRAST TECHNIQUE: Contiguous axial images were obtained from the base of the skull through the vertex without intravenous contrast. COMPARISON:  None. FINDINGS: Brain: No midline shift, ventriculomegaly, mass effect, evidence of mass lesion, intracranial hemorrhage or evidence of cortically based acute infarction. Gray-white matter differentiation is within normal limits throughout the brain. No encephalomalacia identified. Vascular: Calcified atherosclerosis at the skull base. No suspicious intracranial vascular hyperdensity. Skull: Negative. Sinuses/Orbits: Clear. Other: Visualized orbits and scalp soft tissues are within normal limits. ASPECTS [facility] Stroke Program Early CT Score) - Ganglionic level infarction (caudate, lentiform nuclei, internal capsule, insula, M1-M3 cortex): 7 - Supraganglionic infarction (M4-M6 cortex): 3 Total score (0-10 with 10 being normal): 10 IMPRESSION: 1. Negative noncontrast CT appearance of the brain. 2. ASPECTS is 10. 3. Study discussed by telephone with Dr. Meridee Score on 07/19/2017 at 17:09 . Electronically Signed   By: Odessa Fleming M.D.   On: 07/19/2017 17:10   Echocardiogram 07/20/2017: - Left ventricle: The cavity size was normal. Wall thickness  was   increased in a pattern of mild LVH. Systolic function was normal.   The estimated ejection fraction was in the range of 55% to 60%.   Wall motion was normal; there were no regional wall motion   abnormalities. - Right ventricle: The cavity size was mildly dilated. Wall   thickness was normal. - Right atrium: The atrium was mildly dilated. - Atrial septum: No defect or patent foramen ovale was identified.  Subjective: Symptoms have remained resolved. He denies a full review of systems, specifically no numbness, tingling, weakness, slurred speech, imbalance, dizziness, chest pain, dyspnea, leg swelling, palpitations.   Discharge Exam: Vitals:   07/20/17 0821 07/20/17 1625  BP: 135/78 136/86  Pulse: 61 (!) 58  Resp: 15 17  Temp: 98.2 F (36.8 C) 98.4 F (36.9 C)  SpO2: 93% 96%   General: Pt is alert, awake, not in acute distress Cardiovascular: RRR, S1/S2 +, no rubs, no gallops Respiratory: CTA bilaterally, no wheezing, no rhonchi Abdominal: Soft, NT, ND, bowel sounds + Extremities: No edema, no cyanosis Neuro: No focal deficits, alert and oriented.  Labs: Basic Metabolic Panel: Recent Labs  Lab 07/19/17 1650 07/20/17 0415  NA 136 138  K 3.7 3.9  CL 102 103  CO2 23 25  GLUCOSE 100* 114*  BUN 19 12  CREATININE 1.25* 1.20  CALCIUM 9.2 9.0   Liver Function Tests: Recent Labs  Lab 07/19/17 1650 07/20/17 0415  AST 50* 49*  ALT 65* 65*  ALKPHOS 38 39  BILITOT 0.8 1.3*  PROT 7.8 6.5  ALBUMIN 4.3 3.6   CBC: Recent Labs  Lab 07/19/17 1650 07/20/17 0415  WBC 7.3 5.9  NEUTROABS 2.6  --   HGB 15.5 15.0  HCT 44.0 44.2  MCV 99.1 100.5*  PLT 162 141*   Cardiac Enzymes: Recent Labs  Lab 07/19/17 1650  TROPONINI <0.03   Hgb A1c No results for input(s): HGBA1C in the last 72 hours. Lipid Profile No results for input(s): CHOL, HDL, LDLCALC, TRIG, CHOLHDL, LDLDIRECT in the last 72 hours. Urinalysis    Component  Value Date/Time   COLORURINE YELLOW  11/04/2013 0234   APPEARANCEUR CLEAR 11/04/2013 0234   LABSPEC >1.046 (H) 11/04/2013 0234   PHURINE 6.0 11/04/2013 0234   GLUCOSEU NEGATIVE 11/04/2013 0234   HGBUR NEGATIVE 11/04/2013 0234   BILIRUBINUR NEGATIVE 11/04/2013 0234   KETONESUR NEGATIVE 11/04/2013 0234   PROTEINUR NEGATIVE 11/04/2013 0234   UROBILINOGEN 1.0 11/04/2013 0234   NITRITE NEGATIVE 11/04/2013 0234   LEUKOCYTESUR NEGATIVE 11/04/2013 0234    Time coordinating discharge: Approximately 40 minutes  Hazeline Junker, MD  [facility] 07/23/2017, 5:50 PM Pager [phone]

## 2017-07-21 ENCOUNTER — Telehealth: Payer: Self-pay

## 2017-07-21 NOTE — Telephone Encounter (Signed)
LM requesting call back to complete TCM and schedule hospital f/u.  

## 2017-07-22 NOTE — Telephone Encounter (Signed)
LM requesting CB to complete TCM and schedule hosp f/u. 

## 2017-07-25 NOTE — Telephone Encounter (Signed)
Left message requesting call back to complete TCM and schedule hospital f/u.

## 2017-08-11 ENCOUNTER — Ambulatory Visit (INDEPENDENT_AMBULATORY_CARE_PROVIDER_SITE_OTHER): Payer: Managed Care, Other (non HMO) | Admitting: Family Medicine

## 2017-08-11 ENCOUNTER — Encounter: Payer: Self-pay | Admitting: Family Medicine

## 2017-08-11 VITALS — BP 119/77 | HR 65 | Temp 98.2°F | Resp 16 | Ht 72.0 in | Wt 238.1 lb

## 2017-08-11 DIAGNOSIS — I10 Essential (primary) hypertension: Secondary | ICD-10-CM

## 2017-08-11 DIAGNOSIS — E669 Obesity, unspecified: Secondary | ICD-10-CM

## 2017-08-11 DIAGNOSIS — G459 Transient cerebral ischemic attack, unspecified: Secondary | ICD-10-CM

## 2017-08-11 DIAGNOSIS — E7800 Pure hypercholesterolemia, unspecified: Secondary | ICD-10-CM | POA: Diagnosis not present

## 2017-08-11 MED ORDER — LISINOPRIL-HYDROCHLOROTHIAZIDE 20-12.5 MG PO TABS: 2.0000 | ORAL_TABLET | Freq: Every day | ORAL | 1 refills | Status: DC

## 2017-08-11 MED ORDER — ATORVASTATIN CALCIUM 80 MG PO TABS: 80.0000 mg | ORAL_TABLET | Freq: Every day | ORAL | 1 refills | Status: DC

## 2017-08-11 MED ORDER — CLOPIDOGREL BISULFATE 75 MG PO TABS: 75.0000 mg | ORAL_TABLET | Freq: Every day | ORAL | 1 refills | Status: DC

## 2017-08-11 NOTE — Progress Notes (Signed)
OFFICE VISIT  08/11/2017   CC:  Chief Complaint  Patient presents with  . Follow-up    HTN     HPI:    Patient is a 57 y.o. Caucasian male who presents for f/u  I last saw him 05/16/17 at which time his bp was not well controlled so I increased lisin-hct 20/12.5 to 1 and 1/2 tabs per day.  If bp not <130/80 after about 2 weeks, then increase to 2 tabs per day. He was to f/u in 1 mo but did not do so. He was then admitted to hosp 07/20/17 (reviewed notes/labs/imaging today) : sx's were L jaw and arm numbness and blurry vision. CT head neg, CT angio head/neck neg. Additional TIA w/u in hosp NEG. Neuro recommended ASA + plavix x3 weeks, then plavix alone.  He was also put on 80 mg atorva qd. Stopped his ASA yesterday.  Home bp's 130/80 avg, HR avg 60s.  Has some numbness in fingers that he was having prior to latest episode.  No blurry vision.  NO side effects from meds at this time.   Has neurologist f/u in about 2-3 weeks.  Past Medical History:  Diagnosis Date  . Chronic renal insufficiency, stage 2 (mild) 2017   GFR 60s  . Diverticulitis   . Elevated transaminase level 2017   Mild: fatty liver on u/s 11/2015  . Granuloma annulare 02/20/2008   Dr. Everlene FarrierGrubar  . Hyperlipemia, mixed    TLC recommended 2017.  Statin recommended 04/2017 but pt declined.  . Hypertension   . IFG (impaired fasting glucose) 2017   gluc 131.  04/2017 fasting gluc 119, A1c 6.0%.  . Obesity, Class I, BMI 30-34.9   . TIA (transient ischemic attack) 07/2017    Past Surgical History:  Procedure Laterality Date  . BICEPS TENDON REPAIR Right    2'16 -Dr. Amanda PeaGramig  . COLONOSCOPY  approx 2010   normal.  Recall 10 yrs.  Marland Kitchen. HAMMER TOE SURGERY  2015  . LAPAROSCOPIC SIGMOID COLECTOMY N/A 01/31/2015   For diverticulitis.  Procedure: LAPAROSCOPIC SIGMOID COLECTOMY SPLENIC FLEXURE MOBILIZATION;  Surgeon: Axel FillerArmando Ramirez, MD;  Location: [facility];  Service: General;  Laterality: N/A;  . LIPOMA RESECTION  2009   Dr.  Stephens NovemberHolderness  . MOUTH SURGERY    . SMALL INTESTINE SURGERY  01/2015   Small bowel anastomosis developed after sigmoid colectomy --repair required.  . TRANSTHORACIC ECHOCARDIOGRAM  07/20/2017   Mild LVH, EF 55-60%, NORMAL.  Marland Kitchen. VASECTOMY      Outpatient Medications Prior to Visit  Medication Sig Dispense Refill  . FIBER PO Take 1 tablet by mouth every morning.     Marland Kitchen. ibuprofen (ADVIL,MOTRIN) 200 MG tablet Take 200-400 mg by mouth every 6 (six) hours as needed for headache or moderate pain.     . vitamin B-12 (CYANOCOBALAMIN) 1000 MCG tablet Take 1,000 mcg by mouth every morning.     Marland Kitchen. atorvastatin (LIPITOR) 80 MG tablet Take 1 tablet (80 mg total) by mouth daily at 6 PM. 30 tablet 0  . clopidogrel (PLAVIX) 75 MG tablet Take 1 tablet (75 mg total) by mouth daily. 30 tablet 0  . lisinopril-hydrochlorothiazide (PRINZIDE,ZESTORETIC) 20-12.5 MG tablet 1.5 - 2 tabs po qd (Patient taking differently: Take 2 tablets by mouth daily. ) 180 tablet 1  . aspirin EC 81 MG tablet Take 81 mg by mouth every morning.      No facility-administered medications prior to visit.     No Known Allergies  ROS As  per HPI  PE: Blood pressure 119/77, pulse 65, temperature 98.2 F (36.8 C), temperature source Oral, resp. rate 16, height 6' (1.829 m), weight 238 lb 2 oz (108 kg), SpO2 96 %. Body mass index is 32.3 kg/m.  Gen: Alert, well appearing.  Patient is oriented to person, place, time, and situation. AFFECT: pleasant, lucid thought and speech. CV: RRR, no m/r/g.   LUNGS: CTA bilat, nonlabored resps, good aeration in all lung fields. EXT: no clubbing, cyanosis, or edema.    LABS:    Chemistry      Component Value Date/Time   NA 138 07/20/2017 0415   NA 139 11/13/2015   K 3.9 07/20/2017 0415   CL 103 07/20/2017 0415   CO2 25 07/20/2017 0415   BUN 12 07/20/2017 0415   BUN 17 11/13/2015   CREATININE 1.20 07/20/2017 0415   GLU 131 11/13/2015      Component Value Date/Time   CALCIUM 9.0 07/20/2017  0415   ALKPHOS 39 07/20/2017 0415   AST 49 (H) 07/20/2017 0415   ALT 65 (H) 07/20/2017 0415   BILITOT 1.3 (H) 07/20/2017 0415     Lab Results  Component Value Date   WBC 5.9 07/20/2017   HGB 15.0 07/20/2017   HCT 44.2 07/20/2017   MCV 100.5 (H) 07/20/2017   PLT 141 (L) 07/20/2017   Lab Results  Component Value Date   CHOL 247 (H) 07/20/2017   HDL 47 07/20/2017   LDLCALC 155 (H) 07/20/2017   TRIG 223 (H) 07/20/2017   CHOLHDL 5.3 07/20/2017   Lab Results  Component Value Date   HGBA1C 5.9 (H) 07/20/2017    IMPRESSION AND PLAN:  1) HTN: The current medical regimen is effective;  continue present plan and medications. Lytes/cr stable < 1 mo ago.  2) HLD: he is now taking the statin I had recommended for his HLD---he is taking it now that he had a TIA.  3) TIA: w/u unrevealing.  He took DAPT x 3 wks, then has continued plavix alone. Has neuro f/u in a couple weeks. We discussed the necessity of controlling RFs and monitoring of his prediabetes over time.  I reviewed an addendum to his brain MRI that was originally read as normal/no acute on 07/20/17 "there is a suspected punctate focus of abnormal diffusion restriction at the inferior aspect of the right precentral gyrus (series 5001, image 70) and, possibly, more inferiorly within the anterior right parietal lobe (series 5001, image 68). In the context of left upper extremity symptoms, these probably represent tiny foci of acute ischemia. According to the electronic medical record, the patient's symptoms had resolved at the time of imaging. An After Visit Summary was printed and given to the patient."  FOLLOW UP: Return in about 6 months (around 02/10/2018) for routine chronic illness f/u.  Signed:  Santiago Bumpers, MD           08/11/2017

## 2017-08-23 ENCOUNTER — Encounter: Payer: Self-pay | Admitting: Adult Health

## 2017-08-23 ENCOUNTER — Ambulatory Visit (INDEPENDENT_AMBULATORY_CARE_PROVIDER_SITE_OTHER): Payer: Managed Care, Other (non HMO) | Admitting: Adult Health

## 2017-08-23 VITALS — BP 122/66 | HR 74 | Ht 72.0 in | Wt 237.2 lb

## 2017-08-23 DIAGNOSIS — E785 Hyperlipidemia, unspecified: Secondary | ICD-10-CM | POA: Diagnosis not present

## 2017-08-23 DIAGNOSIS — G459 Transient cerebral ischemic attack, unspecified: Secondary | ICD-10-CM | POA: Diagnosis not present

## 2017-08-23 DIAGNOSIS — I10 Essential (primary) hypertension: Secondary | ICD-10-CM | POA: Diagnosis not present

## 2017-08-23 NOTE — Patient Instructions (Signed)
Continue clopidogrel 75 mg daily  and lipitor  for secondary stroke prevention  Continue to follow up with PCP regarding HLD and HTN management   Continue to stay active and eat healthy  Maintain strict control of hypertension with blood pressure goal below 130/90, diabetes with hemoglobin A1c goal below 6.5% and cholesterol with LDL cholesterol (bad cholesterol) goal below 70 mg/dL. I also advised the patient to eat a healthy diet with plenty of whole grains, cereals, fruits and vegetables, exercise regularly and maintain ideal body weight.  Followup in the future with me in 4 months or call earlier if needed       Thank you for coming to see [place] at [facility]. I hope we have been able to provide you high quality care today.  You may receive a patient satisfaction survey over the next few weeks. We would appreciate your feedback and comments so that we may continue to improve ourselves and the health of our patients.

## 2017-08-23 NOTE — Progress Notes (Signed)
[facility] [address] [place]. [place] [address] [phone]       OFFICE FOLLOW UP NOTE  [name] Date of Birth:  [birth year] Medical Record Number:  [id number]   Reason for Referral:  hospital TIA follow up  CHIEF COMPLAINT:  Chief Complaint  Patient presents with  . Follow-up    TIA follow up per Dr.Sethi note, pt was seen by Dr. Pearlean Brownie in hospital will be seeing Shanda Bumps stroke NP     HPI: Jonathan Graves is being seen today for initial visit in the office for TIA on 07/19/17. History obtained from patient and chart review. Reviewed all radiology images and labs personally.  [name] is a 57 y.o. male with history of CKD, HTN, obesity, fatty liver and impaired glucose tolerance presenting with L face/jaw numbness, vision changes and difficulty getting words out.  CT head reviewed and showed no acute stroke.  CTA head was normal.  CTA neck showed bilateral ICA arthrosclerosis without hemodynamically significant stenosis and aortic arthrosclerosis.  MRI normal without signs of acute infarct.  2D echo showed EF of 55 to 60% and negative for PFO.  LDL 155 and A1c satisfactory at 5.9.  Patient was previously taking aspirin 81 mg and recommended DAPT with aspirin and Plavix for 3 weeks and then plavix alone.  Also recommended patient start Lipitor 80 mg for cholesterol control.  Therapy recommended no outpatient needs.  Patient discharged home in stable condition.  Since discharge, patient has been doing well.  He is back doing all previous activities without complications.  Denies new or worsening stroke/TIA symptoms.  Continues to take Plavix only without side effects of bleeding or bruising.  Continues to take Lipitor without side effects of myalgias.  Blood pressure today satisfactory 122/66.  Does have complaints of numbness in his third fourth and fifth digits of his left hand and radiates down towards his elbow.  This was present prior to the stroke and  describes it as a faint numbness but denies pain.  This is not debilitating or interferes with any normal activities.  Patient has been trying to stay active and eating a healthy diet.  ROS:   14 system review of systems performed and negative with exception of dizziness and numbness  PMH:  Past Medical History:  Diagnosis Date  . Chronic renal insufficiency, stage 2 (mild) 2017   GFR 60s  . Diverticulitis   . Elevated transaminase level 2017   Mild: fatty liver on u/s 11/2015  . Granuloma annulare 02/20/2008   Dr. Everlene Farrier  . Hyperlipemia, mixed    TLC recommended 2017.  Statin started 07/2017.  Marland Kitchen Hypertension   . IFG (impaired fasting glucose) 2017   gluc 131.  04/2017 fasting gluc 119, A1c 6.0%.  . Obesity, Class I, BMI 30-34.9   . TIA (transient ischemic attack) 07/2017    PSH:  Past Surgical History:  Procedure Laterality Date  . BICEPS TENDON REPAIR Right    2'16 -Dr. Amanda Pea  . COLONOSCOPY  approx 2010   normal.  Recall 10 yrs.  Marland Kitchen HAMMER TOE SURGERY  2015  . LAPAROSCOPIC SIGMOID COLECTOMY N/A 01/31/2015   For diverticulitis.  Procedure: LAPAROSCOPIC SIGMOID COLECTOMY SPLENIC FLEXURE MOBILIZATION;  Surgeon: Axel Filler, MD;  Location: [facility];  Service: General;  Laterality: N/A;  . LIPOMA RESECTION  2009   Dr. Stephens November  . MOUTH SURGERY    . SMALL INTESTINE SURGERY  01/2015   Small bowel anastomosis developed after sigmoid colectomy --  repair required.  . TRANSTHORACIC ECHOCARDIOGRAM  07/20/2017   Mild LVH, EF 55-60%, NORMAL.  Marland Kitchen VASECTOMY      Social History:  Social History   Socioeconomic History  . Marital status: Married    Spouse name: Not on file  . Number of children: Not on file  . Years of education: Not on file  . Highest education level: Not on file  Occupational History  . Not on file  Social Needs  . Financial resource strain: Not on file  . Food insecurity:    Worry: Not on file    Inability: Not on file  . Transportation needs:     Medical: Not on file    Non-medical: Not on file  Tobacco Use  . Smoking status: Former Smoker    Packs/day: 0.25    Types: Cigarettes  . Smokeless tobacco: Never Used  Substance and Sexual Activity  . Alcohol use: Yes    Alcohol/week: 4.8 oz    Types: 2 Glasses of wine, 6 Cans of beer per week    Comment: daily  . Drug use: No  . Sexual activity: Not on file  Lifestyle  . Physical activity:    Days per week: Not on file    Minutes per session: Not on file  . Stress: Not on file  Relationships  . Social connections:    Talks on phone: Not on file    Gets together: Not on file    Attends religious service: Not on file    Active member of club or organization: Not on file    Attends meetings of clubs or organizations: Not on file    Relationship status: Not on file  . Intimate partner violence:    Fear of current or ex partner: Not on file    Emotionally abused: Not on file    Physically abused: Not on file    Forced sexual activity: Not on file  Other Topics Concern  . Not on file  Social History Narrative   Married, 3 daughters (one set of twin daughters), 1 son--all are grown and out of the house.   Educ: colleg grad--BS.   Occup: business Community education officer.   Tob: no signif regular smoking.   Alc: 2 drinks per week   Drugs: none   No hx of colon or prostate cancer.    Family History:  Family History  Problem Relation Age of Onset  . Gastric cancer Mother   . Breast cancer Mother   . Hypertension Father   . Diabetes Father   . Heart disease Father   . Stroke Father   . Early death Maternal Grandmother        MVA  . Early death Maternal Grandfather        MVA  . CVA Paternal Grandmother   . Alzheimer's disease Paternal Grandfather   . Colon cancer Neg Hx   . Liver disease Neg Hx     Medications:   Current Outpatient Medications on File Prior to Visit  Medication Sig Dispense Refill  . atorvastatin (LIPITOR) 80 MG tablet Take 1 tablet (80 mg total) by  mouth daily at 6 PM. 90 tablet 1  . clopidogrel (PLAVIX) 75 MG tablet Take 1 tablet (75 mg total) by mouth daily. 90 tablet 1  . FIBER PO Take 1 tablet by mouth every morning.     Marland Kitchen lisinopril-hydrochlorothiazide (PRINZIDE,ZESTORETIC) 20-12.5 MG tablet Take 2 tablets by mouth daily. 180 tablet 1  . vitamin B-12 (  CYANOCOBALAMIN) 1000 MCG tablet Take 1,000 mcg by mouth every morning.      No current facility-administered medications on file prior to visit.     Allergies:  No Known Allergies   Physical Exam  Vitals:   08/23/17 1333  Weight: 237 lb 3.2 oz (107.6 kg)   Body mass index is 32.17 kg/m. No exam data present  General: well developed, pleasant middle-aged Caucasian male, well nourished, seated, in no evident distress Head: head normocephalic and atraumatic.   Neck: supple with no carotid or supraclavicular bruits Cardiovascular: regular rate and rhythm, no murmurs Musculoskeletal: no deformity Skin:  no rash/petichiae Vascular:  Normal pulses all extremities  Neurologic Exam Mental Status: Awake and fully alert. Oriented to place and time. Recent and remote memory intact. Attention span, concentration and fund of knowledge appropriate. Mood and affect appropriate.  Cranial Nerves: Fundoscopic exam reveals sharp disc margins. Pupils equal, briskly reactive to light. Extraocular movements full without nystagmus. Visual fields full to confrontation. Hearing intact. Facial sensation intact. Face, tongue, palate moves normally and symmetrically.  Motor: Normal bulk and tone. Normal strength in all tested extremity muscles. Sensory.: intact to touch , pinprick , position and vibratory sensation.  Sensation intact on all digits on left hand Coordination: Rapid alternating movements normal in all extremities. Finger-to-nose and heel-to-shin performed accurately bilaterally. Gait and Station: Arises from chair without difficulty. Stance is normal. Gait demonstrates normal stride  length and balance . Able to heel, toe and tandem walk without difficulty.  Reflexes: 1+ and symmetric. Toes downgoing.    NIHSS  0 Modified Rankin  0   Diagnostic Data (Labs, Imaging, Testing)  CT head WO contrast Study date: 07/19/2017 IMPRESSION: 1. Negative noncontrast CT appearance of the brain. 2. ASPECTS is 10.  CT ANGIO HEAD W OR WO CONTRAST CT ANGIO NECK W OR WO CONTRAST Study date: 07/19/17 IMPRESSION: 1. No aneurysm, occlusion or hemodynamically significant stenosis of the arteries of the head and neck. No arterial malformation or evidence for vasculitis. 2. Bilateral mixed density carotid bifurcation/proximal ICA atherosclerosis without hemodynamically significant stenosis. 3.  Aortic Atherosclerosis (ICD10-I70.0).  MR BRAIN WO CONTRAST 08/05/17 IMPRESSION: Normal brain.  ECHOCARDIOGRAM 07/20/17 Study Conclusions - Left ventricle: The cavity size was normal. Wall thickness was   increased in a pattern of mild LVH. Systolic function was normal.   The estimated ejection fraction was in the range of 55% to 60%.   Wall motion was normal; there were no regional wall motion   abnormalities. - Right ventricle: The cavity size was mildly dilated. Wall   thickness was normal. - Right atrium: The atrium was mildly dilated. - Atrial septum: No defect or patent foramen ovale was identified.    ASSESSMENT: Jonathan Graves is a 57 y.o. year old male here with TIA on 07/19/17. Vascular risk factors include HTN AND HLD.    PLAN: -Continue clopidogrel 75 mg daily  and Lipitor for secondary stroke prevention -F/u with PCP regarding your HLD and HTN management -continue to monitor BP at home -Maintain strict control of hypertension with blood pressure goal below 130/90, diabetes with hemoglobin A1c goal below 6.5% and cholesterol with LDL cholesterol (bad cholesterol) goal below 70 mg/dL. I also advised the patient to eat a healthy diet with plenty of whole grains, cereals,  fruits and vegetables, exercise regularly and maintain ideal body weight.  Follow up in 4 months or call earlier if needed   Greater than 50% time during this 25 minute consultation visit was spent  on counseling and coordination of care about HTN, and HLD, discussion about risk benefit of anticoagulation and answering questions.     George Hugh, AGNP-BC  [facility] [address] [place], [place] [address]  Phone [phone] Fax [phone]

## 2017-08-23 NOTE — Progress Notes (Signed)
I agree with the above plan 

## 2017-09-27 ENCOUNTER — Encounter: Payer: Self-pay | Admitting: Family Medicine

## 2017-09-30 ENCOUNTER — Encounter: Payer: Self-pay | Admitting: Family Medicine

## 2017-12-26 NOTE — Progress Notes (Signed)
[facility] [address] [place]. [place] [address] [phone]       OFFICE FOLLOW UP NOTE  [name] Date of Birth:  10/25/1960 Medical Record Number:  [id number]   Reason for Referral:  TIA follow up  CHIEF COMPLAINT:  Chief Complaint  Patient presents with  . Follow-up    TIA follow up room back hallway pt alone    HPI: Jonathan Graves is being seen today in the office for TIA on 07/19/17. History obtained from patient and chart review. Reviewed all radiology images and labs personally.  [name] is a 57 y.o. male with history of CKD, HTN, obesity, fatty liver and impaired glucose tolerance presenting with L face/jaw numbness, vision changes and difficulty getting words out.  CT head reviewed and showed no acute stroke.  CTA head was normal.  CTA neck showed bilateral ICA arthrosclerosis without hemodynamically significant stenosis and aortic arthrosclerosis.  MRI normal without signs of acute infarct.  2D echo showed EF of 55 to 60% and negative for PFO.  LDL 155 and A1c satisfactory at 5.9.  Patient was previously taking aspirin 81 mg and recommended DAPT with aspirin and Plavix for 3 weeks and then plavix alone.  Also recommended patient start Lipitor 80 mg for cholesterol control.  Therapy recommended no outpatient needs.  Patient discharged home in stable condition.  08/23/17 visit: Since discharge, patient has been doing well.  He is back doing all previous activities without complications.  Denies new or worsening stroke/TIA symptoms.  Continues to take Plavix only without side effects of bleeding or bruising.  Continues to take Lipitor without side effects of myalgias.  Blood pressure today satisfactory 122/66.  Does have complaints of numbness in his third fourth and fifth digits of his left hand and radiates down towards his elbow.  This was present prior to the stroke and describes it as a faint numbness but denies pain.  This is not  debilitating or interferes with any normal activities.  Patient has been trying to stay active and eating a healthy diet.  Interval history 12/27/2017: Patient is being seen today for scheduled follow-up visit.  Overall he continues to do well without residual stroke deficits or recurrence of symptoms.  Continues to take Plavix without side effects of bleeding or bruising.  Continues to take Lipitor without side effects myalgias.  He states he does have a follow-up appointment with PCP next month where blood work will be obtained.  Blood pressure today satisfactory 106/68.  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  Continues to stay active and maintains a healthy diet.  Denies new or worsening stroke/TIA symptoms.    ROS:   14 system review of systems performed and negative with exception of dizziness  PMH:  Past Medical History:  Diagnosis Date  . Chronic renal insufficiency, stage 2 (mild) 2017   GFR 60s  . Diverticulitis   . Elevated transaminase level 2017   Mild: fatty liver on u/s 11/2015  . Granuloma annulare 02/20/2008   Dr. Everlene Farrier  . Hyperlipemia, mixed    TLC recommended 2017.  Statin started 07/2017.  Marland Kitchen Hypertension   . IFG (impaired fasting glucose) 2017   gluc 131.  04/2017 fasting gluc 119, A1c 6.0%.  . Obesity, Class I, BMI 30-34.9   . TIA (transient ischemic attack) 07/2017   w/u unrevealing (CT head/MRI head/echo/carotid dopplers.  Plavix + ASA x 3 wks, then plavix alone for secondary prevention.  Doing well as of 08/2017 neuro f/u.    PSH:  Past Surgical History:  Procedure Laterality Date  . BICEPS TENDON REPAIR Right    2'16 -Dr. Amanda Pea  . COLONOSCOPY  approx 2010   normal.  Recall 10 yrs.  Marland Kitchen HAMMER TOE SURGERY  2015  . LAPAROSCOPIC SIGMOID COLECTOMY N/A 01/31/2015   For diverticulitis.  Procedure: LAPAROSCOPIC SIGMOID COLECTOMY SPLENIC FLEXURE MOBILIZATION;  Surgeon: Axel Filler, MD;  Location: [facility];  Service: General;   Laterality: N/A;  . LIPOMA RESECTION  2009   Dr. Stephens November  . MOUTH SURGERY    . SMALL INTESTINE SURGERY  01/2015   Small bowel anastomosis developed after sigmoid colectomy --repair required.  . TRANSTHORACIC ECHOCARDIOGRAM  07/20/2017   Mild LVH, EF 55-60%, NORMAL.  Marland Kitchen VASECTOMY      Social History:  Social History   Socioeconomic History  . Marital status: Married    Spouse name: Not on file  . Number of children: Not on file  . Years of education: Not on file  . Highest education level: Not on file  Occupational History  . Not on file  Social Needs  . Financial resource strain: Not on file  . Food insecurity:    Worry: Not on file    Inability: Not on file  . Transportation needs:    Medical: Not on file    Non-medical: Not on file  Tobacco Use  . Smoking status: Former Smoker    Packs/day: 0.25    Types: Cigarettes  . Smokeless tobacco: Never Used  Substance and Sexual Activity  . Alcohol use: Yes    Alcohol/week: 8.0 standard drinks    Types: 2 Glasses of wine, 6 Cans of beer per week    Comment: daily  . Drug use: No  . Sexual activity: Not on file  Lifestyle  . Physical activity:    Days per week: Not on file    Minutes per session: Not on file  . Stress: Not on file  Relationships  . Social connections:    Talks on phone: Not on file    Gets together: Not on file    Attends religious service: Not on file    Active member of club or organization: Not on file    Attends meetings of clubs or organizations: Not on file    Relationship status: Not on file  . Intimate partner violence:    Fear of current or ex partner: Not on file    Emotionally abused: Not on file    Physically abused: Not on file    Forced sexual activity: Not on file  Other Topics Concern  . Not on file  Social History Narrative   Married, 3 daughters (one set of twin daughters), 1 son--all are grown and out of the house.   Educ: colleg grad--BS.   Occup: business Tax inspector.   Tob: no signif regular smoking.   Alc: 2 drinks per week   Drugs: none   No hx of colon or prostate cancer.    Family History:  Family History  Problem Relation Age of Onset  . Gastric cancer Mother   . Breast cancer Mother   . Hypertension Father   . Diabetes Father   . Heart disease Father   . Stroke Father   . Early death Maternal Grandmother        MVA  . Early death Maternal Grandfather        MVA  . CVA  Paternal Grandmother   . Alzheimer's disease Paternal Grandfather   . Colon cancer Neg Hx   . Liver disease Neg Hx     Medications:   Current Outpatient Medications on File Prior to Visit  Medication Sig Dispense Refill  . atorvastatin (LIPITOR) 80 MG tablet Take 1 tablet (80 mg total) by mouth daily at 6 PM. 90 tablet 1  . clopidogrel (PLAVIX) 75 MG tablet Take 1 tablet (75 mg total) by mouth daily. 90 tablet 1  . FIBER PO Take 1 tablet by mouth every morning.     Marland Kitchen lisinopril-hydrochlorothiazide (PRINZIDE,ZESTORETIC) 20-12.5 MG tablet Take 2 tablets by mouth daily. 180 tablet 1  . vitamin B-12 (CYANOCOBALAMIN) 1000 MCG tablet Take 1,000 mcg by mouth every morning.      No current facility-administered medications on file prior to visit.     Allergies:  No Known Allergies   Physical Exam  Vitals:   12/27/17 1320  BP: 106/68  Pulse: 67  Weight: 237 lb (107.5 kg)  Height: 6' (1.829 m)   Body mass index is 32.14 kg/m. No exam data present  General: well developed, pleasant middle-aged Caucasian male, well nourished, seated, in no evident distress Head: head normocephalic and atraumatic.   Neck: supple with no carotid or supraclavicular bruits Cardiovascular: regular rate and rhythm, no murmurs Musculoskeletal: no deformity Skin:  no rash/petichiae Vascular:  Normal pulses all extremities  Neurologic Exam Mental Status: Awake and fully alert. Oriented to place and time. Recent and remote memory intact. Attention span, concentration and fund  of knowledge appropriate. Mood and affect appropriate.  Cranial Nerves: Fundoscopic exam reveals sharp disc margins. Pupils equal, briskly reactive to light. Extraocular movements full without nystagmus. Visual fields full to confrontation. Hearing intact. Facial sensation intact. Face, tongue, palate moves normally and symmetrically.  Motor: Normal bulk and tone. Normal strength in all tested extremity muscles. Sensory.: intact to touch , pinprick , position and vibratory sensation.  Sensation intact on all digits on left hand Coordination: Rapid alternating movements normal in all extremities. Finger-to-nose and heel-to-shin performed accurately bilaterally. Gait and Station: Arises from chair without difficulty. Stance is normal. Gait demonstrates normal stride length and balance . Able to heel, toe and tandem walk without difficulty.  Reflexes: 1+ and symmetric. Toes downgoing.      Diagnostic Data (Labs, Imaging, Testing)  CT head WO contrast Study date: 07/19/2017 IMPRESSION: 1. Negative noncontrast CT appearance of the brain. 2. ASPECTS is 10.  CT ANGIO HEAD W OR WO CONTRAST CT ANGIO NECK W OR WO CONTRAST Study date: 07/19/17 IMPRESSION: 1. No aneurysm, occlusion or hemodynamically significant stenosis of the arteries of the head and neck. No arterial malformation or evidence for vasculitis. 2. Bilateral mixed density carotid bifurcation/proximal ICA atherosclerosis without hemodynamically significant stenosis. 3.  Aortic Atherosclerosis (ICD10-I70.0).  MR BRAIN WO CONTRAST 08/05/17 IMPRESSION: Normal brain.  ECHOCARDIOGRAM 07/20/17 Study Conclusions - Left ventricle: The cavity size was normal. Wall thickness was   increased in a pattern of mild LVH. Systolic function was normal.   The estimated ejection fraction was in the range of 55% to 60%.   Wall motion was normal; there were no regional wall motion   abnormalities. - Right ventricle: The cavity size was mildly  dilated. Wall   thickness was normal. - Right atrium: The atrium was mildly dilated. - Atrial septum: No defect or patent foramen ovale was identified.    ASSESSMENT: Jonathan Graves is a 57 y.o. year old male here with TIA  on 07/19/17. Vascular risk factors include HTN AND HLD.  Patient is being seen today for scheduled follow-up visit and overall continues to do well from a stroke standpoint.   PLAN: -Continue clopidogrel 75 mg daily  and Lipitor for secondary stroke prevention -F/u with PCP regarding your HLD and HTN management -continue to monitor BP at home -Maintain strict control of hypertension with blood pressure goal below 130/90, diabetes with hemoglobin A1c goal below 6.5% and cholesterol with LDL cholesterol (bad cholesterol) goal below 70 mg/dL. I also advised the patient to eat a healthy diet with plenty of whole grains, cereals, fruits and vegetables, exercise regularly and maintain ideal body weight.  Follow up as needed as patient has been stable from TIA standpoint and advised to call with questions/concerns   Greater than 50% time during this 25 minute consultation visit was spent on counseling and coordination of care about HTN, and HLD, discussion about risk benefit of anticoagulation and answering questions.     George Hugh, AGNP-BC  [facility] [address] [place], [place] [address]  Phone [phone] Fax [phone]

## 2017-12-27 ENCOUNTER — Encounter: Payer: Self-pay | Admitting: Adult Health

## 2017-12-27 ENCOUNTER — Ambulatory Visit (INDEPENDENT_AMBULATORY_CARE_PROVIDER_SITE_OTHER): Payer: Managed Care, Other (non HMO) | Admitting: Adult Health

## 2017-12-27 VITALS — BP 106/68 | HR 67 | Ht 72.0 in | Wt 237.0 lb

## 2017-12-27 DIAGNOSIS — E785 Hyperlipidemia, unspecified: Secondary | ICD-10-CM | POA: Diagnosis not present

## 2017-12-27 DIAGNOSIS — I10 Essential (primary) hypertension: Secondary | ICD-10-CM

## 2017-12-27 DIAGNOSIS — G459 Transient cerebral ischemic attack, unspecified: Secondary | ICD-10-CM | POA: Diagnosis not present

## 2017-12-27 NOTE — Patient Instructions (Signed)
Continue clopidogrel 75 mg daily  and lipitor  for secondary stroke prevention  Continue to follow up with PCP regarding cholesterol and blood pressure management   Continue to monitor blood pressure at home intermittently   Maintain strict control of hypertension with blood pressure goal below 130/90, diabetes with hemoglobin A1c goal below 6.5% and cholesterol with LDL cholesterol (bad cholesterol) goal below 70 mg/dL. I also advised the patient to eat a healthy diet with plenty of whole grains, cereals, fruits and vegetables, exercise regularly and maintain ideal body weight.  Followup in the future with me as needed or call earlier if needed       Thank you for coming to see [place] at [facility]. I hope we have been able to provide you high quality care today.  You may receive a patient satisfaction survey over the next few weeks. We would appreciate your feedback and comments so that we may continue to improve ourselves and the health of our patients.

## 2018-01-01 NOTE — Progress Notes (Signed)
I agree with the above plan 

## 2018-01-26 ENCOUNTER — Other Ambulatory Visit: Payer: Self-pay | Admitting: Family Medicine

## 2018-02-10 ENCOUNTER — Encounter: Payer: Self-pay | Admitting: Family Medicine

## 2018-02-10 ENCOUNTER — Ambulatory Visit (INDEPENDENT_AMBULATORY_CARE_PROVIDER_SITE_OTHER): Payer: Managed Care, Other (non HMO) | Admitting: Family Medicine

## 2018-02-10 VITALS — BP 154/82 | HR 59 | Temp 98.2°F | Resp 16 | Ht 72.0 in | Wt 241.4 lb

## 2018-02-10 DIAGNOSIS — I10 Essential (primary) hypertension: Secondary | ICD-10-CM

## 2018-02-10 DIAGNOSIS — E669 Obesity, unspecified: Secondary | ICD-10-CM

## 2018-02-10 DIAGNOSIS — Z23 Encounter for immunization: Secondary | ICD-10-CM | POA: Diagnosis not present

## 2018-02-10 DIAGNOSIS — E7800 Pure hypercholesterolemia, unspecified: Secondary | ICD-10-CM

## 2018-02-10 DIAGNOSIS — R7303 Prediabetes: Secondary | ICD-10-CM

## 2018-02-10 DIAGNOSIS — N182 Chronic kidney disease, stage 2 (mild): Secondary | ICD-10-CM

## 2018-02-10 DIAGNOSIS — N2889 Other specified disorders of kidney and ureter: Secondary | ICD-10-CM

## 2018-02-10 LAB — COMPREHENSIVE METABOLIC PANEL
ALBUMIN: 4.5 g/dL (ref 3.5–5.2)
ALK PHOS: 36 U/L — AB (ref 39–117)
ALT: 34 U/L (ref 0–53)
AST: 26 U/L (ref 0–37)
BILIRUBIN TOTAL: 1.2 mg/dL (ref 0.2–1.2)
BUN: 17 mg/dL (ref 6–23)
CALCIUM: 9.3 mg/dL (ref 8.4–10.5)
CHLORIDE: 103 meq/L (ref 96–112)
CO2: 26 mEq/L (ref 19–32)
CREATININE: 1.26 mg/dL (ref 0.40–1.50)
GFR: 62.42 mL/min (ref >60.00)
Glucose, Bld: 132 mg/dL — ABNORMAL HIGH (ref 70–99)
Potassium: 4 mEq/L (ref 3.5–5.1)
Sodium: 139 mEq/L (ref 135–145)
TOTAL PROTEIN: 7.1 g/dL (ref 6.0–8.3)

## 2018-02-10 LAB — LIPID PANEL
CHOLESTEROL: 143 mg/dL (ref 0–200)
HDL: 57.8 mg/dL (ref >39.00)
LDL CALC: 50 mg/dL (ref 0–99)
NonHDL: 84.83
TRIGLYCERIDES: 174 mg/dL — AB (ref 0.0–149.0)
Total CHOL/HDL Ratio: 2
VLDL: 34.8 mg/dL (ref 0.0–40.0)

## 2018-02-10 LAB — HEMOGLOBIN A1C: HEMOGLOBIN A1C: 6 % (ref 4.6–6.5)

## 2018-02-10 MED ORDER — ATORVASTATIN CALCIUM 80 MG PO TABS: 80.0000 mg | ORAL_TABLET | Freq: Every day | ORAL | 1 refills | Status: DC

## 2018-02-10 MED ORDER — CLOPIDOGREL BISULFATE 75 MG PO TABS: 75.0000 mg | ORAL_TABLET | Freq: Every day | ORAL | 1 refills | Status: DC

## 2018-02-10 NOTE — Addendum Note (Signed)
Addended by: Jeoffrey Massed on: 02/10/2018 08:26 AM   Modules accepted: Orders

## 2018-02-10 NOTE — Progress Notes (Signed)
OFFICE VISIT  02/10/2018   CC:  Chief Complaint  Patient presents with  . Follow-up    RCI, pt is fasting.    HPI:    Patient is a 57 y.o. Caucasian male who presents for 6 mo f/u HTN, HLD, CRI II (GFR 60s), and prediabetes.   HTN: no home bp checking b/c it was doing alright. Less exercise than his usual, drinking more, poor diet.  Lots of personal issues that are stressful for him.  HLD:  No prob's with taking statin daily. Diet poor last several months.  CRI: says he drinking clear liquids fairly well. No signif NSAIDs---RARE use of ibuprofen.  Prediabetes:  Poor diet and exercise habits lately---lots of life stressors.  He admits he needs to get more disciplined soon and says he plans on it.  ROS: no CP, no SOB, no wheezing, no cough, no dizziness, no HAs, no rashes, no melena/hematochezia.  No polyuria or polydipsia.  No myalgias or arthralgias.   Past Medical History:  Diagnosis Date  . Chronic renal insufficiency, stage 2 (mild) 2017   GFR 60s  . Diverticulitis   . Elevated transaminase level 2017   Mild: fatty liver on u/s 11/2015  . Granuloma annulare 02/20/2008   Dr. Everlene Farrier  . Hyperlipemia, mixed    TLC recommended 2017.  Statin started 07/2017.  Marland Kitchen Hypertension   . IFG (impaired fasting glucose) 2017   gluc 131.  04/2017 fasting gluc 119, A1c 6.0%.  . Obesity, Class I, BMI 30-34.9   . TIA (transient ischemic attack) 07/2017   w/u unrevealing (CT head/MRI head/echo/carotid dopplers.  Plavix + ASA x 3 wks, then plavix alone for secondary prevention.  Doing well as of 12/2017 neuro f/u.    Past Surgical History:  Procedure Laterality Date  . BICEPS TENDON REPAIR Right    2'16 -Dr. Amanda Pea  . COLONOSCOPY  approx 2010   normal.  Recall 10 yrs.  Marland Kitchen HAMMER TOE SURGERY  2015  . LAPAROSCOPIC SIGMOID COLECTOMY N/A 01/31/2015   For diverticulitis.  Procedure: LAPAROSCOPIC SIGMOID COLECTOMY SPLENIC FLEXURE MOBILIZATION;  Surgeon: Axel Filler, MD;  Location: [facility];   Service: General;  Laterality: N/A;  . LIPOMA RESECTION  2009   Dr. Stephens November  . MOUTH SURGERY    . SMALL INTESTINE SURGERY  01/2015   Small bowel anastomosis developed after sigmoid colectomy --repair required.  . TRANSTHORACIC ECHOCARDIOGRAM  07/20/2017   Mild LVH, EF 55-60%, NORMAL.  Marland Kitchen VASECTOMY      Outpatient Medications Prior to Visit  Medication Sig Dispense Refill  . atorvastatin (LIPITOR) 80 MG tablet Take 1 tablet (80 mg total) by mouth daily at 6 PM. 90 tablet 1  . clopidogrel (PLAVIX) 75 MG tablet Take 1 tablet (75 mg total) by mouth daily. 90 tablet 1  . FIBER PO Take 1 tablet by mouth every morning.     Marland Kitchen lisinopril-hydrochlorothiazide (PRINZIDE,ZESTORETIC) 20-12.5 MG tablet TAKE 2 TABLETS BY MOUTH EVERY DAY 180 tablet 0  . vitamin B-12 (CYANOCOBALAMIN) 1000 MCG tablet Take 1,000 mcg by mouth every morning.      No facility-administered medications prior to visit.     No Known Allergies  ROS As per HPI  PE: Blood pressure (!) 154/82, pulse (!) 59, temperature 98.2 F (36.8 C), temperature source Oral, resp. rate 16, height 6' (1.829 m), weight 241 lb 6 oz (109.5 kg), SpO2 95 %. Gen: Alert, well appearing.  Patient is oriented to person, place, time, and situation. AFFECT: pleasant, lucid thought  and speech. CV: RRR, no m/r/g.   LUNGS: CTA bilat, nonlabored resps, good aeration in all lung fields. EXT: no clubbing or cyanosis.  no edema.    LABS:  Lab Results  Component Value Date   TSH 3.49 05/16/2017   Lab Results  Component Value Date   WBC 5.9 07/20/2017   HGB 15.0 07/20/2017   HCT 44.2 07/20/2017   MCV 100.5 (H) 07/20/2017   PLT 141 (L) 07/20/2017   Lab Results  Component Value Date   CREATININE 1.20 07/20/2017   BUN 12 07/20/2017   NA 138 07/20/2017   K 3.9 07/20/2017   CL 103 07/20/2017   CO2 25 07/20/2017   Lab Results  Component Value Date   ALT 65 (H) 07/20/2017   AST 49 (H) 07/20/2017   ALKPHOS 39 07/20/2017   BILITOT 1.3 (H)  07/20/2017   Lab Results  Component Value Date   CHOL 247 (H) 07/20/2017   Lab Results  Component Value Date   HDL 47 07/20/2017   Lab Results  Component Value Date   LDLCALC 155 (H) 07/20/2017   Lab Results  Component Value Date   TRIG 223 (H) 07/20/2017   Lab Results  Component Value Date   CHOLHDL 5.3 07/20/2017   Lab Results  Component Value Date   PSA 0.94 05/16/2017   Lab Results  Component Value Date   HGBA1C 5.9 (H) 07/20/2017    IMPRESSION AND PLAN:  1) HTN: pt under a lot of stress and living poor lifestyle last 4 mo or so. After some life stressors are passed (daughter's wedding, some contractors are living in his home, poor sleep, etc) he will get back in to better eating and exercise habits, drink less alcohol, and start checking bp at home more regularly. Decided against any additional bp med today. BMET today.  2) HLD: tolerating statin. FLP today.  AST/ALT today--has hx of NASH.  3) Prediabetes: see diet and exercise comments in #1 above. Hba1c today.  4) CRI with GFR in the 60s:  Fluid intake fine, avoiding NSAIDs. BMET today.  An After Visit Summary was printed and given to the patient.  FOLLOW UP: Return in about 6 months (around 08/12/2018) for annual CPE (fasting).  Signed:  Santiago Bumpers, MD           02/10/2018

## 2018-02-10 NOTE — Addendum Note (Signed)
Addended by: Smitty Knudsen on: 02/10/2018 08:31 AM   Modules accepted: Orders

## 2018-02-13 ENCOUNTER — Encounter: Payer: Self-pay | Admitting: Family Medicine

## 2018-05-03 ENCOUNTER — Other Ambulatory Visit: Payer: Self-pay | Admitting: Family Medicine

## 2018-05-14 ENCOUNTER — Other Ambulatory Visit: Payer: Self-pay | Admitting: Family Medicine

## 2018-08-12 ENCOUNTER — Other Ambulatory Visit: Payer: Self-pay | Admitting: Family Medicine

## 2018-08-14 NOTE — Telephone Encounter (Signed)
Pt is due for CPE- it was scheduled tomorrow and canceled due to CoVid.   Please advise if we can send in RF's for patient?

## 2018-08-14 NOTE — Telephone Encounter (Signed)
OK to RF x 90 with 1 additional RF on each of these rx's.-thx

## 2018-08-15 ENCOUNTER — Encounter: Payer: Managed Care, Other (non HMO) | Admitting: Family Medicine

## 2018-11-16 ENCOUNTER — Other Ambulatory Visit: Payer: Self-pay

## 2018-11-16 ENCOUNTER — Encounter: Payer: Self-pay | Admitting: Family Medicine

## 2018-11-16 ENCOUNTER — Ambulatory Visit (INDEPENDENT_AMBULATORY_CARE_PROVIDER_SITE_OTHER): Payer: BC Managed Care – PPO | Admitting: Family Medicine

## 2018-11-16 VITALS — BP 128/80 | HR 64 | Temp 97.3°F | Resp 17 | Ht 72.0 in | Wt 236.2 lb

## 2018-11-16 DIAGNOSIS — I10 Essential (primary) hypertension: Secondary | ICD-10-CM | POA: Diagnosis not present

## 2018-11-16 DIAGNOSIS — Z125 Encounter for screening for malignant neoplasm of prostate: Secondary | ICD-10-CM | POA: Diagnosis not present

## 2018-11-16 DIAGNOSIS — R7303 Prediabetes: Secondary | ICD-10-CM

## 2018-11-16 DIAGNOSIS — Z1159 Encounter for screening for other viral diseases: Secondary | ICD-10-CM | POA: Diagnosis not present

## 2018-11-16 DIAGNOSIS — E7800 Pure hypercholesterolemia, unspecified: Secondary | ICD-10-CM

## 2018-11-16 DIAGNOSIS — E669 Obesity, unspecified: Secondary | ICD-10-CM | POA: Insufficient documentation

## 2018-11-16 DIAGNOSIS — Z0000 Encounter for general adult medical examination without abnormal findings: Secondary | ICD-10-CM

## 2018-11-16 LAB — LIPID PANEL
Cholesterol: 157 mg/dL (ref 0–200)
HDL: 54.8 mg/dL (ref >39.00)
LDL Cholesterol: 76 mg/dL (ref 0–99)
NonHDL: 101.9
Total CHOL/HDL Ratio: 3
Triglycerides: 132 mg/dL (ref 0.0–149.0)
VLDL: 26.4 mg/dL (ref 0.0–40.0)

## 2018-11-16 LAB — COMPREHENSIVE METABOLIC PANEL
ALT: 26 U/L (ref 0–53)
AST: 23 U/L (ref 0–37)
Albumin: 4.8 g/dL (ref 3.5–5.2)
Alkaline Phosphatase: 39 U/L (ref 39–117)
BUN: 24 mg/dL — ABNORMAL HIGH (ref 6–23)
CO2: 28 mEq/L (ref 19–32)
Calcium: 9.8 mg/dL (ref 8.4–10.5)
Chloride: 100 mEq/L (ref 96–112)
Creatinine, Ser: 1.3 mg/dL (ref 0.40–1.50)
GFR: 56.49 mL/min — ABNORMAL LOW (ref >60.00)
Glucose, Bld: 104 mg/dL — ABNORMAL HIGH (ref 70–99)
Potassium: 3.9 mEq/L (ref 3.5–5.1)
Sodium: 137 mEq/L (ref 135–145)
Total Bilirubin: 1.2 mg/dL (ref 0.2–1.2)
Total Protein: 7.4 g/dL (ref 6.0–8.3)

## 2018-11-16 LAB — PSA: PSA: 0.99 ng/mL (ref 0.10–4.00)

## 2018-11-16 LAB — CBC WITH DIFFERENTIAL/PLATELET
Basophils Absolute: 0 10*3/uL (ref 0.0–0.1)
Basophils Relative: 0.7 % (ref 0.0–3.0)
Eosinophils Absolute: 0 10*3/uL (ref 0.0–0.7)
Eosinophils Relative: 0.8 % (ref 0.0–5.0)
HCT: 42.9 % (ref 39.0–52.0)
Hemoglobin: 14.7 g/dL (ref 13.0–17.0)
Lymphocytes Relative: 50.7 % — ABNORMAL HIGH (ref 12.0–46.0)
Lymphs Abs: 2.9 10*3/uL (ref 0.7–4.0)
MCHC: 34.2 g/dL (ref 30.0–36.0)
MCV: 101 fl — ABNORMAL HIGH (ref 78.0–100.0)
Monocytes Absolute: 0.5 10*3/uL (ref 0.1–1.0)
Monocytes Relative: 8 % (ref 3.0–12.0)
Neutro Abs: 2.3 10*3/uL (ref 1.4–7.7)
Neutrophils Relative %: 39.8 % — ABNORMAL LOW (ref 43.0–77.0)
Platelets: 170 10*3/uL (ref 150.0–400.0)
RBC: 4.25 Mil/uL (ref 4.22–5.81)
RDW: 13.4 % (ref 11.5–15.5)
WBC: 5.7 10*3/uL (ref 4.0–10.5)

## 2018-11-16 LAB — TSH: TSH: 3.57 u[IU]/mL (ref 0.35–4.50)

## 2018-11-16 LAB — HEMOGLOBIN A1C: Hgb A1c MFr Bld: 5.9 % (ref 4.6–6.5)

## 2018-11-16 MED ORDER — CLOPIDOGREL BISULFATE 75 MG PO TABS: 75.0000 mg | ORAL_TABLET | Freq: Every day | ORAL | 3 refills | Status: DC

## 2018-11-16 MED ORDER — LISINOPRIL-HYDROCHLOROTHIAZIDE 20-12.5 MG PO TABS: ORAL_TABLET | ORAL | 3 refills | Status: DC

## 2018-11-16 MED ORDER — ATORVASTATIN CALCIUM 80 MG PO TABS: 80.0000 mg | ORAL_TABLET | Freq: Every day | ORAL | 3 refills | Status: DC

## 2018-11-16 NOTE — Progress Notes (Signed)
Office Note 11/16/2018  CC:  Chief Complaint  Patient presents with  . Annual Exam    fasting. due for a colonoscopy    HPI:  Jonathan Graves is a 58 y.o. White male who is here for annual health maintenance exam.  Unfortunately, he lost his job due to [employer] economic junk. Trying to decide whether to try to retire or find a new job.  Not exercising lately b/c sprained his ankle again. Diet and exercise discipline has gone away lately w/all the covid stuff.  Home bp's normal consistently.  Still with fair amount of orthostatic dizziness.  Past Medical History:  Diagnosis Date  . Chronic renal insufficiency, stage 2 (mild) 2017   GFR 60s  . Diverticulitis   . Elevated transaminase level 2017   Mild: fatty liver on u/s 11/2015  . Granuloma annulare 02/20/2008   Dr. Everlene FarrierGrubar  . Hyperlipemia, mixed    TLC recommended 2017.  Statin started 07/2017.  Marland Kitchen. Hypertension   . IFG (impaired fasting glucose) 2017   gluc 131.  04/2017 fasting gluc 119, A1c 6.0%.  A1c stable 01/2018.  . Obesity, Class I, BMI 30-34.9   . TIA (transient ischemic attack) 07/2017   w/u unrevealing (CT head/MRI head/echo/carotid dopplers.  Plavix + ASA x 3 wks, then plavix alone for secondary prevention.  Doing well as of 12/2017 neuro f/u.    Past Surgical History:  Procedure Laterality Date  . BICEPS TENDON REPAIR Right    2'16 -Dr. Amanda PeaGramig  . COLONOSCOPY  approx 2010   normal.  Recall 10 yrs.  Marland Kitchen. HAMMER TOE SURGERY  2015  . LAPAROSCOPIC SIGMOID COLECTOMY N/A 01/31/2015   For diverticulitis.  Procedure: LAPAROSCOPIC SIGMOID COLECTOMY SPLENIC FLEXURE MOBILIZATION;  Surgeon: Axel FillerArmando Ramirez, MD;  Location: [facility];  Service: General;  Laterality: N/A;  . LIPOMA RESECTION  2009   Dr. Stephens NovemberHolderness  . MOUTH SURGERY    . SMALL INTESTINE SURGERY  01/2015   Small bowel anastomosis developed after sigmoid colectomy --repair required.  . TRANSTHORACIC ECHOCARDIOGRAM  07/20/2017   Mild LVH, EF 55-60%, NORMAL.  Marland Kitchen.  VASECTOMY      Family History  Problem Relation Age of Onset  . Gastric cancer Mother   . Breast cancer Mother   . Hypertension Father   . Diabetes Father   . Heart disease Father   . Stroke Father   . Early death Maternal Grandmother        MVA  . Early death Maternal Grandfather        MVA  . CVA Paternal Grandmother   . Alzheimer's disease Paternal Grandfather   . Colon cancer Neg Hx   . Liver disease Neg Hx     Social History   Socioeconomic History  . Marital status: Married    Spouse name: Not on file  . Number of children: Not on file  . Years of education: Not on file  . Highest education level: Not on file  Occupational History  . Not on file  Social Needs  . Financial resource strain: Not on file  . Food insecurity    Worry: Not on file    Inability: Not on file  . Transportation needs    Medical: Not on file    Non-medical: Not on file  Tobacco Use  . Smoking status: Former Smoker    Packs/day: 0.25    Types: Cigarettes  . Smokeless tobacco: Never Used  Substance and Sexual Activity  . Alcohol use: Yes  Alcohol/week: 8.0 standard drinks    Types: 2 Glasses of wine, 6 Cans of beer per week    Comment: daily  . Drug use: No  . Sexual activity: Not on file  Lifestyle  . Physical activity    Days per week: Not on file    Minutes per session: Not on file  . Stress: Not on file  Relationships  . Social Herbalist on phone: Not on file    Gets together: Not on file    Attends religious service: Not on file    Active member of club or organization: Not on file    Attends meetings of clubs or organizations: Not on file    Relationship status: Not on file  . Intimate partner violence    Fear of current or ex partner: Not on file    Emotionally abused: Not on file    Physically abused: Not on file    Forced sexual activity: Not on file  Other Topics Concern  . Not on file  Social History Narrative   Married, 3 daughters (one set of  twin daughters), 1 son--all are grown and out of the house.   Educ: colleg grad--BS.   Occup: business Education officer, museum.   Tob: no signif regular smoking.   Alc: 2 drinks per week   Drugs: none   No hx of colon or prostate cancer.    Outpatient Medications Prior to Visit  Medication Sig Dispense Refill  . FIBER PO Take 1 tablet by mouth every morning.     . vitamin B-12 (CYANOCOBALAMIN) 1000 MCG tablet Take 1,000 mcg by mouth every morning.     Marland Kitchen atorvastatin (LIPITOR) 80 MG tablet TAKE 1 TABLET (80 MG TOTAL) BY MOUTH DAILY AT 6 PM. 90 tablet 1  . clopidogrel (PLAVIX) 75 MG tablet TAKE 1 TABLET BY MOUTH EVERY DAY 90 tablet 1  . lisinopril-hydrochlorothiazide (PRINZIDE,ZESTORETIC) 20-12.5 MG tablet TAKE 1.5-2 TABLETS BY MOUTH EVERY DAY 180 tablet 1   No facility-administered medications prior to visit.     No Known Allergies  ROS Review of Systems  Constitutional: Negative for appetite change, chills, fatigue and fever.  HENT: Negative for congestion, dental problem, ear pain and sore throat.   Eyes: Negative for discharge, redness and visual disturbance.  Respiratory: Negative for cough, chest tightness, shortness of breath and wheezing.   Cardiovascular: Negative for chest pain, palpitations and leg swelling.  Gastrointestinal: Negative for abdominal pain, blood in stool, diarrhea, nausea and vomiting.  Genitourinary: Negative for difficulty urinating, dysuria, flank pain, frequency, hematuria and urgency.       Erectile dysfunction  Musculoskeletal: Negative for arthralgias, back pain, joint swelling, myalgias and neck stiffness.  Skin: Negative for pallor and rash.  Neurological: Negative for dizziness, speech difficulty, weakness and headaches.  Hematological: Negative for adenopathy. Does not bruise/bleed easily.  Psychiatric/Behavioral: Negative for confusion and sleep disturbance. The patient is not nervous/anxious.     PE; Blood pressure 128/80, pulse 64, temperature  (!) 97.3 F (36.3 C), temperature source Temporal, resp. rate 17, height 6' (1.829 m), weight 236 lb 4 oz (107.2 kg), SpO2 97 %. Body mass index is 32.04 kg/m.  Gen: Alert, well appearing.  Patient is oriented to person, place, time, and situation. AFFECT: pleasant, lucid thought and speech. ENT: Ears: EACs clear, normal epithelium.  TMs with good light reflex and landmarks bilaterally.  Eyes: no injection, icteris, swelling, or exudate.  EOMI, PERRLA. Nose: no drainage or turbinate edema/swelling.  No injection or focal lesion.  Mouth: lips without lesion/swelling.  Oral mucosa pink and moist.  Dentition intact and without obvious caries or gingival swelling.  Oropharynx without erythema, exudate, or swelling.  Neck: supple/nontender.  No LAD, mass, or TM.  Carotid pulses 2+ bilaterally, without bruits. CV: RRR, no m/r/g.   LUNGS: CTA bilat, nonlabored resps, good aeration in all lung fields. ABD: soft, NT, ND, BS normal.  No hepatospenomegaly or mass.  No bruits. EXT: no clubbing, cyanosis, or edema.  Musculoskeletal: no joint swelling, erythema, warmth, or tenderness.  ROM of all joints intact. Skin - no sores or suspicious lesions or rashes or color changes Rectal exam: negative without mass, lesions or tenderness, PROSTATE EXAM: smooth and symmetric without nodules or tenderness.   Pertinent labs:  Lab Results  Component Value Date   TSH 3.49 05/16/2017   Lab Results  Component Value Date   WBC 5.9 07/20/2017   HGB 15.0 07/20/2017   HCT 44.2 07/20/2017   MCV 100.5 (H) 07/20/2017   PLT 141 (L) 07/20/2017   Lab Results  Component Value Date   CREATININE 1.26 02/10/2018   BUN 17 02/10/2018   NA 139 02/10/2018   K 4.0 02/10/2018   CL 103 02/10/2018   CO2 26 02/10/2018   Lab Results  Component Value Date   ALT 34 02/10/2018   AST 26 02/10/2018   ALKPHOS 36 (L) 02/10/2018   BILITOT 1.2 02/10/2018   Lab Results  Component Value Date   CHOL 143 02/10/2018   Lab Results   Component Value Date   HDL 57.80 02/10/2018   Lab Results  Component Value Date   LDLCALC 50 02/10/2018   Lab Results  Component Value Date   TRIG 174.0 (H) 02/10/2018   Lab Results  Component Value Date   CHOLHDL 2 02/10/2018   Lab Results  Component Value Date   PSA 0.94 05/16/2017   Lab Results  Component Value Date   HGBA1C 6.0 02/10/2018    ASSESSMENT AND PLAN:   Health maintenance exam: Reviewed age and gender appropriate health maintenance issues (prudent diet, regular exercise, health risks of tobacco and excessive alcohol, use of seatbelts, fire alarms in home, use of sunscreen).  Also reviewed age and gender appropriate health screening as well as vaccine recommendations. Vaccines: all UTD. Labs: HP labs + A1c (IFG).  Hep C screening. Prostate ca screening: DRE normal, PSA. Colon ca screening: next colonoscopy is due this year (approx 2010 was his last colonoscopy).  He'll contact Dr. Marge DuncansSchooler's office to arrange repeat.  An After Visit Summary was printed and given to the patient.  FOLLOW UP:  Return in about 6 months (around 05/19/2019) for routine chronic illness f/u.   Signed:  Santiago BumpersPhil Adaliz Dobis, MD           11/16/2018

## 2018-11-16 NOTE — Patient Instructions (Signed)

## 2018-11-17 ENCOUNTER — Encounter: Payer: Self-pay | Admitting: Family Medicine

## 2018-11-17 LAB — HEPATITIS C ANTIBODY
Hepatitis C Ab: NONREACTIVE
SIGNAL TO CUT-OFF: 0.01 (ref <1.00)

## 2019-01-16 DIAGNOSIS — Z20828 Contact with and (suspected) exposure to other viral communicable diseases: Secondary | ICD-10-CM | POA: Diagnosis not present

## 2019-05-01 ENCOUNTER — Other Ambulatory Visit: Payer: Self-pay

## 2019-05-02 ENCOUNTER — Ambulatory Visit (INDEPENDENT_AMBULATORY_CARE_PROVIDER_SITE_OTHER): Payer: BC Managed Care – PPO | Admitting: Family Medicine

## 2019-05-02 ENCOUNTER — Encounter: Payer: Self-pay | Admitting: Family Medicine

## 2019-05-02 VITALS — BP 128/82 | HR 72 | Temp 97.6°F | Resp 16 | Ht 72.0 in | Wt 242.1 lb

## 2019-05-02 DIAGNOSIS — R7303 Prediabetes: Secondary | ICD-10-CM

## 2019-05-02 DIAGNOSIS — E7800 Pure hypercholesterolemia, unspecified: Secondary | ICD-10-CM | POA: Diagnosis not present

## 2019-05-02 DIAGNOSIS — Z23 Encounter for immunization: Secondary | ICD-10-CM

## 2019-05-02 DIAGNOSIS — I10 Essential (primary) hypertension: Secondary | ICD-10-CM | POA: Diagnosis not present

## 2019-05-02 LAB — BASIC METABOLIC PANEL
BUN: 23 mg/dL (ref 6–23)
CO2: 25 mEq/L (ref 19–32)
Calcium: 9.7 mg/dL (ref 8.4–10.5)
Chloride: 98 mEq/L (ref 96–112)
Creatinine, Ser: 1.37 mg/dL (ref 0.40–1.50)
GFR: 53.09 mL/min — ABNORMAL LOW (ref >60.00)
Glucose, Bld: 100 mg/dL — ABNORMAL HIGH (ref 70–99)
Potassium: 4.4 mEq/L (ref 3.5–5.1)
Sodium: 136 mEq/L (ref 135–145)

## 2019-05-02 LAB — POCT GLYCOSYLATED HEMOGLOBIN (HGB A1C)
HbA1c POC (<> result, manual entry): 5.6 % (ref 4.0–5.6)
HbA1c, POC (controlled diabetic range): 5.6 % (ref 0.0–7.0)
HbA1c, POC (prediabetic range): 5.6 % — AB (ref 5.7–6.4)
Hemoglobin A1C: 5.6 % (ref 4.0–5.6)

## 2019-05-02 LAB — LIPID PANEL
Cholesterol: 160 mg/dL (ref 0–200)
HDL: 49.6 mg/dL (ref >39.00)
NonHDL: 110.55
Total CHOL/HDL Ratio: 3
Triglycerides: 204 mg/dL — ABNORMAL HIGH (ref 0.0–149.0)
VLDL: 40.8 mg/dL — ABNORMAL HIGH (ref 0.0–40.0)

## 2019-05-02 LAB — LDL CHOLESTEROL, DIRECT: Direct LDL: 91 mg/dL

## 2019-05-02 NOTE — Addendum Note (Signed)
Addended by: Eulah Pont on: 05/02/2019 09:25 AM   Modules accepted: Orders

## 2019-05-02 NOTE — Progress Notes (Signed)
OFFICE VISIT  05/02/2019   CC:  Chief Complaint  Patient presents with  . Hypertension    6 month RCI    HPI:    Patient is a 59 y.o. Caucasian male who presents for 6 mo f/u HTN, HLD, and prediabetes. He has CRI 2, GFR in 60s. Hx TIA 2019, takes plavix for secondary prevention. Unfortunately, at last f/u he reported he had lost his job due to [employer] changes.  Interim hx:  Still has not found a job, considering retirement.  Sounding upbeat today, though.  Prediab/obesity: was walking regularly prior to X-mas holidays. Trying to make some small dietary changes regarding lower carbs and fats.  HTN: occ/rare bp check at home is 140 syst or less, no diastolics recalled. Taking 2 tabs of his zestoric qd.  HLD: taking his statin med daily w/out problem.   ROS: no CP, no SOB, no wheezing, no cough, no dizziness, no HAs, no rashes, no melena/hematochezia.  No polyuria or polydipsia.  No myalgias or arthralgias.  Past Medical History:  Diagnosis Date  . Chronic renal insufficiency, stage 2 (mild) 2017   GFR 60s  . Diverticulitis   . Elevated transaminase level 2017   Mild: fatty liver on u/s 11/2015  . Granuloma annulare 02/20/2008   Dr. Larena Sox  . Hyperlipemia, mixed    TLC recommended 2017.  Statin started 07/2017.  Marland Kitchen Hypertension   . IFG (impaired fasting glucose) 2017   gluc 131.  04/2017 fasting gluc 119, A1c 6.0%.  A1c stable 01/2018 and 10/2018.  . Obesity, Class I, BMI 30-34.9   . TIA (transient ischemic attack) 07/2017   w/u unrevealing (CT head/MRI head/echo/carotid dopplers.  Plavix + ASA x 3 wks, then plavix alone for secondary prevention.  Doing well as of 12/2017 neuro f/u.    Past Surgical History:  Procedure Laterality Date  . BICEPS TENDON REPAIR Right    2'16 -Dr. Amedeo Plenty  . COLONOSCOPY  approx 2010   normal.  Recall 10 yrs.  Marland Kitchen HAMMER TOE SURGERY  2015  . LAPAROSCOPIC SIGMOID COLECTOMY N/A 01/31/2015   For diverticulitis.  Procedure: LAPAROSCOPIC  SIGMOID COLECTOMY SPLENIC FLEXURE MOBILIZATION;  Surgeon: Ralene Ok, MD;  Location: [facility];  Service: General;  Laterality: N/A;  . LIPOMA RESECTION  2009   Dr. Dessie Coma  . MOUTH SURGERY    . SMALL INTESTINE SURGERY  01/2015   Small bowel anastomosis developed after sigmoid colectomy --repair required.  . TRANSTHORACIC ECHOCARDIOGRAM  07/20/2017   Mild LVH, EF 55-60%, NORMAL.  Marland Kitchen VASECTOMY      Outpatient Medications Prior to Visit  Medication Sig Dispense Refill  . atorvastatin (LIPITOR) 80 MG tablet Take 1 tablet (80 mg total) by mouth daily at 6 PM. 90 tablet 3  . clopidogrel (PLAVIX) 75 MG tablet Take 1 tablet (75 mg total) by mouth daily. 90 tablet 3  . FIBER PO Take 1 tablet by mouth every morning.     Marland Kitchen lisinopril-hydrochlorothiazide (ZESTORETIC) 20-12.5 MG tablet TAKE 1.5-2 TABLETS BY MOUTH EVERY DAY 180 tablet 3  . vitamin B-12 (CYANOCOBALAMIN) 1000 MCG tablet Take 1,000 mcg by mouth every morning.      No facility-administered medications prior to visit.    No Known Allergies  ROS As per HPI  PE:  Initial bp today was 135/88, manual bp recheck at end of visit today was 128/82 Blood pressure 128/82, pulse 72, temperature 97.6 F (36.4 C), temperature source Temporal, resp. rate 16, height 6' (1.829 m), weight 242  lb 2 oz (109.8 kg), SpO2 95 %. Body mass index is 32.84 kg/m.  Gen: Alert, well appearing.  Patient is oriented to person, place, time, and situation. AFFECT: pleasant, lucid thought and speech. CV: RRR, no m/r/g.   LUNGS: CTA bilat, nonlabored resps, good aeration in all lung fields. EXT: no clubbing or cyanosis.  no edema.    LABS:  Lab Results  Component Value Date   TSH 3.57 11/16/2018   Lab Results  Component Value Date   WBC 5.7 11/16/2018   HGB 14.7 11/16/2018   HCT 42.9 11/16/2018   MCV 101.0 (H) 11/16/2018   PLT 170.0 11/16/2018  No results found for: VITAMINB12 No results found for: IRON, TIBC, FERRITIN  Lab Results  Component  Value Date   CREATININE 1.30 11/16/2018   BUN 24 (H) 11/16/2018   NA 137 11/16/2018   K 3.9 11/16/2018   CL 100 11/16/2018   CO2 28 11/16/2018   Lab Results  Component Value Date   ALT 26 11/16/2018   AST 23 11/16/2018   ALKPHOS 39 11/16/2018   BILITOT 1.2 11/16/2018   Lab Results  Component Value Date   CHOL 157 11/16/2018   Lab Results  Component Value Date   HDL 54.80 11/16/2018   Lab Results  Component Value Date   LDLCALC 76 11/16/2018   Lab Results  Component Value Date   TRIG 132.0 11/16/2018   Lab Results  Component Value Date   CHOLHDL 3 11/16/2018   Lab Results  Component Value Date   PSA 0.99 11/16/2018   PSA 0.94 05/16/2017   Lab Results  Component Value Date   HGBA1C 5.9 11/16/2018   POCT HbA1c today: 5.6%  IMPRESSION AND PLAN:  1) Uncontrolled HTN: pt prefers to get back on regular schedule of home monitoring.   Declines increase/addition in bp med regimen at this time.  2) Prediabetes/obesity: needs to continue to get more aggressive with efforts at exercise/diet/wt loss. HbA1c in office today was excellent->5.6%, also check fasting glucose today.  3) HLD: tolerating statin. FLP today. Hepatic panel normal 6 mo today.  4) CRI II/III-->GFR around 60. BMET today.  5) Prev health care: Flu vaccine given today.  An After Visit Summary was printed and given to the patient.  FOLLOW UP: Return in about 3 months (around 07/31/2019) for f/u HTN.  Signed:  Santiago Bumpers, MD           05/02/2019

## 2019-05-10 ENCOUNTER — Telehealth: Payer: Self-pay | Admitting: Family Medicine

## 2019-05-10 NOTE — Telephone Encounter (Signed)
Patient refill request   atorvastatin (LIPITOR) 80 MG tablet [[id number]]   clopidogrel (PLAVIX) 75 MG tablet [[id number]]    [facility]  Patient can be reached at [phone]

## 2019-05-10 NOTE — Telephone Encounter (Signed)
Pt was advised refills should be available at the pharmacy for both meds requested. He will call the pharmacy

## 2019-07-31 ENCOUNTER — Ambulatory Visit (INDEPENDENT_AMBULATORY_CARE_PROVIDER_SITE_OTHER): Payer: Managed Care, Other (non HMO) | Admitting: Family Medicine

## 2019-07-31 ENCOUNTER — Encounter: Payer: Self-pay | Admitting: Family Medicine

## 2019-07-31 ENCOUNTER — Other Ambulatory Visit: Payer: Self-pay

## 2019-07-31 VITALS — BP 127/85 | HR 70 | Temp 97.8°F | Resp 16 | Ht 72.0 in | Wt 247.0 lb

## 2019-07-31 DIAGNOSIS — I10 Essential (primary) hypertension: Secondary | ICD-10-CM | POA: Diagnosis not present

## 2019-07-31 NOTE — Progress Notes (Signed)
OFFICE VISIT  07/31/2019   CC:  Chief Complaint  Patient presents with  . Follow-up    hypertension, pt is not fasting   HPI:    Patient is a 59 y.o. Caucasian male who presents for 3 mo f/u HTN. A/P as of last visit: "1) Uncontrolled HTN: pt prefers to get back on regular schedule of home monitoring.   Declines increase/addition in bp med regimen at this time."  All labs last visit were stable.  Interim hx: Feeling well. A bit stressed more lately than usual, though-->he has restarted working, his wife is sick lately and will require surgery soon.  He has a new grandchild as of about 1 mo ago but can't says he can't see her yet (lives in [place] and pt wants to get covid vaccine before going). He is not exercising much and not ideal diet.  He has been taking 2 of the lisin/hct 20-12.5 tabs daily. Home bp's: reviewed approx 10 measurements from the last 2 mo or so and they avg low 130s over low 80s, highest syst 140s, highest diast about 90.   Past Medical History:  Diagnosis Date  . Chronic renal insufficiency, stage 2 (mild) 2017   GFR 60s  . Diverticulitis   . Elevated transaminase level 2017   Mild: fatty liver on u/s 11/2015  . Granuloma annulare 02/20/2008   Dr. Larena Sox  . Hyperlipemia, mixed    TLC recommended 2017.  Statin started 07/2017.  Marland Kitchen Hypertension   . IFG (impaired fasting glucose) 2017   gluc 131.  04/2017 fasting gluc 119, A1c 6.0%.  A1c stable 01/2018 and 10/2018.  . Obesity, Class I, BMI 30-34.9   . TIA (transient ischemic attack) 07/2017   w/u unrevealing (CT head/MRI head/echo/carotid dopplers.  Plavix + ASA x 3 wks, then plavix alone for secondary prevention.  Doing well as of 12/2017 neuro f/u.    Past Surgical History:  Procedure Laterality Date  . BICEPS TENDON REPAIR Right    2'16 -Dr. Amedeo Plenty  . COLONOSCOPY  approx 2010   normal.  Recall 10 yrs.  Marland Kitchen HAMMER TOE SURGERY  2015  . LAPAROSCOPIC SIGMOID COLECTOMY N/A 01/31/2015   For diverticulitis.   Procedure: LAPAROSCOPIC SIGMOID COLECTOMY SPLENIC FLEXURE MOBILIZATION;  Surgeon: Ralene Ok, MD;  Location: [facility];  Service: General;  Laterality: N/A;  . LIPOMA RESECTION  2009   Dr. Dessie Coma  . MOUTH SURGERY    . SMALL INTESTINE SURGERY  01/2015   Small bowel anastomosis developed after sigmoid colectomy --repair required.  . TRANSTHORACIC ECHOCARDIOGRAM  07/20/2017   Mild LVH, EF 55-60%, NORMAL.  Marland Kitchen VASECTOMY      Outpatient Medications Prior to Visit  Medication Sig Dispense Refill  . atorvastatin (LIPITOR) 80 MG tablet Take 1 tablet (80 mg total) by mouth daily at 6 PM. 90 tablet 3  . clopidogrel (PLAVIX) 75 MG tablet Take 1 tablet (75 mg total) by mouth daily. 90 tablet 3  . FIBER PO Take 1 tablet by mouth every morning.     Marland Kitchen lisinopril-hydrochlorothiazide (ZESTORETIC) 20-12.5 MG tablet TAKE 1.5-2 TABLETS BY MOUTH EVERY DAY 180 tablet 3  . vitamin B-12 (CYANOCOBALAMIN) 1000 MCG tablet Take 1,000 mcg by mouth every morning.      No facility-administered medications prior to visit.    No Known Allergies  ROS As per HPI  PE: Blood pressure 127/85, pulse 70, temperature 97.8 F (36.6 C), temperature source Temporal, resp. rate 16, height 6' (1.829 m), weight 247 lb (112  kg), SpO2 95 %. Gen: Alert, well appearing.  Patient is oriented to person, place, time, and situation. AFFECT: pleasant, lucid thought and speech. No further exam today.  LABS:    Chemistry      Component Value Date/Time   NA 136 05/02/2019 0911   NA 139 11/13/2015 0000   K 4.4 05/02/2019 0911   CL 98 05/02/2019 0911   CO2 25 05/02/2019 0911   BUN 23 05/02/2019 0911   BUN 17 11/13/2015 0000   CREATININE 1.37 05/02/2019 0911   GLU 131 11/13/2015 0000      Component Value Date/Time   CALCIUM 9.7 05/02/2019 0911   ALKPHOS 39 11/16/2018 0917   AST 23 11/16/2018 0917   ALT 26 11/16/2018 0917   BILITOT 1.2 11/16/2018 0917     Lab Results  Component Value Date   CHOL 160 05/02/2019    HDL 49.60 05/02/2019   LDLCALC 76 11/16/2018   LDLDIRECT 91.0 05/02/2019   TRIG 204.0 (H) 05/02/2019   CHOLHDL 3 05/02/2019   Lab Results  Component Value Date   HGBA1C 5.6 05/02/2019   HGBA1C 5.6 05/02/2019   HGBA1C 5.6 (A) 05/02/2019   HGBA1C 5.6 05/02/2019    IMPRESSION AND PLAN:  HTN: not ideal control. Shared decision-making today -->he chooses to hold off on any med increase/addition at this time, wants to take more initiative with improving diet and exercise and see how this helps.    No labs needed today.  An After Visit Summary was printed and given to the patient.  FOLLOW UP: Return in about 3 months (around 10/30/2019) for annual CPE (fasting).  Signed:  Santiago Bumpers, MD           07/31/2019

## 2019-10-30 ENCOUNTER — Encounter: Payer: Managed Care, Other (non HMO) | Admitting: Family Medicine

## 2019-11-02 ENCOUNTER — Encounter: Payer: Self-pay | Admitting: Family Medicine

## 2019-11-02 ENCOUNTER — Ambulatory Visit (INDEPENDENT_AMBULATORY_CARE_PROVIDER_SITE_OTHER): Payer: Managed Care, Other (non HMO) | Admitting: Family Medicine

## 2019-11-02 ENCOUNTER — Other Ambulatory Visit: Payer: Self-pay

## 2019-11-02 VITALS — BP 121/79 | HR 66 | Temp 98.1°F | Resp 16 | Ht 72.0 in | Wt 242.8 lb

## 2019-11-02 DIAGNOSIS — R7301 Impaired fasting glucose: Secondary | ICD-10-CM

## 2019-11-02 DIAGNOSIS — Z125 Encounter for screening for malignant neoplasm of prostate: Secondary | ICD-10-CM | POA: Diagnosis not present

## 2019-11-02 DIAGNOSIS — I10 Essential (primary) hypertension: Secondary | ICD-10-CM | POA: Diagnosis not present

## 2019-11-02 DIAGNOSIS — E782 Mixed hyperlipidemia: Secondary | ICD-10-CM | POA: Diagnosis not present

## 2019-11-02 DIAGNOSIS — Z1211 Encounter for screening for malignant neoplasm of colon: Secondary | ICD-10-CM | POA: Diagnosis not present

## 2019-11-02 DIAGNOSIS — Z0000 Encounter for general adult medical examination without abnormal findings: Secondary | ICD-10-CM

## 2019-11-02 LAB — LIPID PANEL
Cholesterol: 134 mg/dL (ref 0–200)
HDL: 45.7 mg/dL (ref >39.00)
LDL Cholesterol: 60 mg/dL (ref 0–99)
NonHDL: 88.08
Total CHOL/HDL Ratio: 3
Triglycerides: 140 mg/dL (ref 0.0–149.0)
VLDL: 28 mg/dL (ref 0.0–40.0)

## 2019-11-02 LAB — COMPREHENSIVE METABOLIC PANEL
ALT: 31 U/L (ref 0–53)
AST: 22 U/L (ref 0–37)
Albumin: 4.6 g/dL (ref 3.5–5.2)
Alkaline Phosphatase: 35 U/L — ABNORMAL LOW (ref 39–117)
BUN: 21 mg/dL (ref 6–23)
CO2: 27 mEq/L (ref 19–32)
Calcium: 9.3 mg/dL (ref 8.4–10.5)
Chloride: 103 mEq/L (ref 96–112)
Creatinine, Ser: 1.39 mg/dL (ref 0.40–1.50)
GFR: 52.12 mL/min — ABNORMAL LOW (ref >60.00)
Glucose, Bld: 124 mg/dL — ABNORMAL HIGH (ref 70–99)
Potassium: 4 mEq/L (ref 3.5–5.1)
Sodium: 138 mEq/L (ref 135–145)
Total Bilirubin: 0.7 mg/dL (ref 0.2–1.2)
Total Protein: 6.8 g/dL (ref 6.0–8.3)

## 2019-11-02 LAB — CBC WITH DIFFERENTIAL/PLATELET
Basophils Absolute: 0 10*3/uL (ref 0.0–0.1)
Basophils Relative: 0.6 % (ref 0.0–3.0)
Eosinophils Absolute: 0 10*3/uL (ref 0.0–0.7)
Eosinophils Relative: 0.8 % (ref 0.0–5.0)
HCT: 41.1 % (ref 39.0–52.0)
Hemoglobin: 14 g/dL (ref 13.0–17.0)
Lymphocytes Relative: 44.7 % (ref 12.0–46.0)
Lymphs Abs: 2.4 10*3/uL (ref 0.7–4.0)
MCHC: 33.9 g/dL (ref 30.0–36.0)
MCV: 101.8 fl — ABNORMAL HIGH (ref 78.0–100.0)
Monocytes Absolute: 0.4 10*3/uL (ref 0.1–1.0)
Monocytes Relative: 7.5 % (ref 3.0–12.0)
Neutro Abs: 2.5 10*3/uL (ref 1.4–7.7)
Neutrophils Relative %: 46.4 % (ref 43.0–77.0)
Platelets: 173 10*3/uL (ref 150.0–400.0)
RBC: 4.04 Mil/uL — ABNORMAL LOW (ref 4.22–5.81)
RDW: 13.3 % (ref 11.5–15.5)
WBC: 5.4 10*3/uL (ref 4.0–10.5)

## 2019-11-02 LAB — PSA: PSA: 0.75 ng/mL (ref 0.10–4.00)

## 2019-11-02 LAB — HEMOGLOBIN A1C: Hgb A1c MFr Bld: 6.2 % (ref 4.6–6.5)

## 2019-11-02 MED ORDER — CLOPIDOGREL BISULFATE 75 MG PO TABS: 75.0000 mg | ORAL_TABLET | Freq: Every day | ORAL | 3 refills | Status: DC

## 2019-11-02 MED ORDER — ATORVASTATIN CALCIUM 80 MG PO TABS: 80.0000 mg | ORAL_TABLET | Freq: Every day | ORAL | 3 refills | Status: DC

## 2019-11-02 MED ORDER — LISINOPRIL-HYDROCHLOROTHIAZIDE 20-12.5 MG PO TABS: ORAL_TABLET | ORAL | 3 refills | Status: DC

## 2019-11-02 NOTE — Progress Notes (Signed)
Office Note 11/02/2019  CC:  Chief Complaint  Patient presents with  . Annual Exam    pt is fasting  + f/u HTN and HLD.  HPI:  Jonathan Graves is a 59 y.o. White male who is here for annual health maintenance exam. A/P as of last visit a few months ago: "HTN: not ideal control. Shared decision-making today -->he chooses to hold off on any med increase/addition at this time, wants to take more initiative with improving diet and exercise and see how this helps."  INTERIM HX: Doing fine. Got to see his new granddaughter and is very happy. No acute complaints. Exercise and diet habits not very good last 6 mo, has had to take care of his wife who is recovering from hip replacement.  HTN: some bps 130s-140s syst, some 110-120, avg diast 80. Has some mild lightheadedness upon standing.  Annoyance only.  Has to watch out to not get dehydrated.  HLD: tolerating statin.   Past Medical History:  Diagnosis Date  . Chronic renal insufficiency, stage 2 (mild) 2017   GFR 60s  . Diverticulitis   . Elevated transaminase level 2017   Mild: fatty liver on u/s 11/2015  . Granuloma annulare 02/20/2008   Dr. Everlene Farrier  . Hyperlipemia, mixed    TLC recommended 2017.  Statin started 07/2017.  Marland Kitchen Hypertension   . IFG (impaired fasting glucose) 2017   gluc 131.  04/2017 fasting gluc 119, A1c 6.0%.  A1c stable 01/2018 and 10/2018.  . Obesity, Class I, BMI 30-34.9   . TIA (transient ischemic attack) 07/2017   w/u unrevealing (CT head/MRI head/echo/carotid dopplers.  Plavix + ASA x 3 wks, then plavix alone for secondary prevention.  Doing well as of 12/2017 neuro f/u.    Past Surgical History:  Procedure Laterality Date  . BICEPS TENDON REPAIR Right    2'16 -Dr. Amanda Pea  . COLONOSCOPY  approx 2010   normal.  Recall 10 yrs.  Marland Kitchen HAMMER TOE SURGERY  2015  . LAPAROSCOPIC SIGMOID COLECTOMY N/A 01/31/2015   For diverticulitis.  Procedure: LAPAROSCOPIC SIGMOID COLECTOMY SPLENIC FLEXURE MOBILIZATION;   Surgeon: Axel Filler, MD;  Location: [facility];  Service: General;  Laterality: N/A;  . LIPOMA RESECTION  2009   Dr. Stephens November  . MOUTH SURGERY    . SMALL INTESTINE SURGERY  01/2015   Small bowel anastomosis developed after sigmoid colectomy --repair required.  . TRANSTHORACIC ECHOCARDIOGRAM  07/20/2017   Mild LVH, EF 55-60%, NORMAL.  Marland Kitchen VASECTOMY      Family History  Problem Relation Age of Onset  . Gastric cancer Mother   . Breast cancer Mother   . Hypertension Father   . Diabetes Father   . Heart disease Father   . Stroke Father   . Early death Maternal Grandmother        MVA  . Early death Maternal Grandfather        MVA  . CVA Paternal Grandmother   . Alzheimer's disease Paternal Grandfather   . Colon cancer Neg Hx   . Liver disease Neg Hx     Social History   Socioeconomic History  . Marital status: Married    Spouse name: Not on file  . Number of children: Not on file  . Years of education: Not on file  . Highest education level: Not on file  Occupational History  . Not on file  Tobacco Use  . Smoking status: Former Smoker    Packs/day: 0.25    Types: Cigarettes  .  Smokeless tobacco: Never Used  Vaping Use  . Vaping Use: Never used  Substance and Sexual Activity  . Alcohol use: Yes    Alcohol/week: 8.0 standard drinks    Types: 2 Glasses of wine, 6 Cans of beer per week    Comment: daily  . Drug use: No  . Sexual activity: Not on file  Other Topics Concern  . Not on file  Social History Narrative   Married, 3 daughters (one set of twin daughters), 1 son--all are grown and out of the house.   Educ: colleg grad--BS.   Occup: business Community education officer.   Tob: no signif regular smoking.   Alc: 2 drinks per week   Drugs: none   No hx of colon or prostate cancer.   Social Determinants of Health   Financial Resource Strain:   . Difficulty of Paying Living Expenses:   Food Insecurity:   . Worried About Programme researcher, broadcasting/film/video in the Last Year:   .  Barista in the Last Year:   Transportation Needs:   . Freight forwarder (Medical):   Marland Kitchen Lack of Transportation (Non-Medical):   Physical Activity:   . Days of Exercise per Week:   . Minutes of Exercise per Session:   Stress:   . Feeling of Stress :   Social Connections:   . Frequency of Communication with Friends and Family:   . Frequency of Social Gatherings with Friends and Family:   . Attends Religious Services:   . Active Member of Clubs or Organizations:   . Attends Banker Meetings:   Marland Kitchen Marital Status:   Intimate Partner Violence:   . Fear of Current or Ex-Partner:   . Emotionally Abused:   Marland Kitchen Physically Abused:   . Sexually Abused:     Outpatient Medications Prior to Visit  Medication Sig Dispense Refill  . FIBER PO Take 1 tablet by mouth every morning.     . vitamin B-12 (CYANOCOBALAMIN) 1000 MCG tablet Take 1,000 mcg by mouth every morning.     Marland Kitchen atorvastatin (LIPITOR) 80 MG tablet Take 1 tablet (80 mg total) by mouth daily at 6 PM. 90 tablet 3  . clopidogrel (PLAVIX) 75 MG tablet Take 1 tablet (75 mg total) by mouth daily. 90 tablet 3  . lisinopril-hydrochlorothiazide (ZESTORETIC) 20-12.5 MG tablet TAKE 1.5-2 TABLETS BY MOUTH EVERY DAY 180 tablet 3   No facility-administered medications prior to visit.    No Known Allergies  ROS Review of Systems  Constitutional: Negative for appetite change, chills, fatigue and fever.  HENT: Negative for congestion, dental problem, ear pain and sore throat.   Eyes: Negative for discharge, redness and visual disturbance.  Respiratory: Negative for cough, chest tightness, shortness of breath and wheezing.   Cardiovascular: Negative for chest pain, palpitations and leg swelling.  Gastrointestinal: Negative for abdominal pain, blood in stool, diarrhea, nausea and vomiting.  Genitourinary: Negative for difficulty urinating, dysuria, flank pain, frequency, hematuria and urgency.  Musculoskeletal: Negative for  arthralgias, back pain, joint swelling, myalgias and neck stiffness.  Skin: Negative for pallor and rash.  Neurological: Negative for dizziness, speech difficulty, weakness and headaches.  Hematological: Negative for adenopathy. Does not bruise/bleed easily.  Psychiatric/Behavioral: Negative for confusion and sleep disturbance. The patient is not nervous/anxious.     PE; Vitals with BMI 11/02/2019 07/31/2019 05/02/2019  Height 6\' 0"  6\' 0"  -  Weight 242 lbs 13 oz 247 lbs -  BMI 32.92 33.49 -  Systolic 121  127 128  Diastolic 79 85 82  Pulse 66 70 -  O2 sat on RA today is 95%  Gen: Alert, well appearing.  Patient is oriented to person, place, time, and situation. AFFECT: pleasant, lucid thought and speech. ENT: Ears: EACs clear, normal epithelium.  TMs with good light reflex and landmarks bilaterally.  Eyes: no injection, icteris, swelling, or exudate.  EOMI, PERRLA. Nose: no drainage or turbinate edema/swelling.  No injection or focal lesion.  Mouth: lips without lesion/swelling.  Oral mucosa pink and moist.  Dentition intact and without obvious caries or gingival swelling.  Oropharynx without erythema, exudate, or swelling.  Neck: supple/nontender.  No LAD, mass, or TM.  Carotid pulses 2+ bilaterally, without bruits. CV: RRR, no m/r/g.   LUNGS: CTA bilat, nonlabored resps, good aeration in all lung fields. ABD: soft, NT, ND, BS normal.  No hepatospenomegaly or mass.  No bruits. EXT: no clubbing, cyanosis, or edema.  Musculoskeletal: no joint swelling, erythema, warmth, or tenderness.  ROM of all joints intact. Skin - no sores or suspicious lesions or rashes or color changes Rectal exam: negative without mass, lesions or tenderness, PROSTATE EXAM: smooth and symmetric without nodules or tenderness.   Pertinent labs:  Lab Results  Component Value Date   TSH 3.57 11/16/2018   Lab Results  Component Value Date   WBC 5.7 11/16/2018   HGB 14.7 11/16/2018   HCT 42.9 11/16/2018   MCV  101.0 (H) 11/16/2018   PLT 170.0 11/16/2018   Lab Results  Component Value Date   CREATININE 1.37 05/02/2019   BUN 23 05/02/2019   NA 136 05/02/2019   K 4.4 05/02/2019   CL 98 05/02/2019   CO2 25 05/02/2019   Lab Results  Component Value Date   ALT 26 11/16/2018   AST 23 11/16/2018   ALKPHOS 39 11/16/2018   BILITOT 1.2 11/16/2018   Lab Results  Component Value Date   CHOL 160 05/02/2019   Lab Results  Component Value Date   HDL 49.60 05/02/2019   Lab Results  Component Value Date   LDLCALC 76 11/16/2018   Lab Results  Component Value Date   TRIG 204.0 (H) 05/02/2019   Lab Results  Component Value Date   CHOLHDL 3 05/02/2019   Lab Results  Component Value Date   PSA 0.99 11/16/2018   PSA 0.94 05/16/2017   Lab Results  Component Value Date   HGBA1C 5.6 05/02/2019   HGBA1C 5.6 05/02/2019   HGBA1C 5.6 (A) 05/02/2019   HGBA1C 5.6 05/02/2019    ASSESSMENT AND PLAN:   1) HTN: avg bp 130/80.  Continue to monitor at home, call or return if persistently >140/90 or <100/60.  Lytes/cr today.  RF for lisin/hct 20-12.5 TWO tabs qd sent today.  2) HLD: tolerating statin.  He knows he needs to try to improve TLC---life too busy lately.  3) Health maintenance exam: Reviewed age and gender appropriate health maintenance issues (prudent diet, regular exercise, health risks of tobacco and excessive alcohol, use of seatbelts, fire alarms in home, use of sunscreen).  Also reviewed age and gender appropriate health screening as well as vaccine recommendations. Vaccines:  All utd. Covid vaccine->UTD. Labs: fasting CBC, Lipids, CMET, A1c, PSA all ordered (IFG, HTN, HLD). Prostate ca screening: DRE normal, PSA. Colon ca screening: he was due for rpt colonoscopy 2020-->pt plans to schedule this soon.  An After Visit Summary was printed and given to the patient.  FOLLOW UP:  Return in about 6 months (  around 05/04/2020) for annual CPE (fasting).  Signed:  Santiago BumpersPhil Darren Caldron, MD            11/02/2019

## 2019-11-02 NOTE — Patient Instructions (Signed)

## 2019-11-04 ENCOUNTER — Encounter: Payer: Self-pay | Admitting: Family Medicine

## 2019-11-05 ENCOUNTER — Other Ambulatory Visit: Payer: Self-pay

## 2019-11-05 DIAGNOSIS — R7303 Prediabetes: Secondary | ICD-10-CM

## 2020-02-05 ENCOUNTER — Ambulatory Visit: Payer: Managed Care, Other (non HMO)

## 2020-05-06 ENCOUNTER — Ambulatory Visit: Payer: Managed Care, Other (non HMO) | Admitting: Family Medicine

## 2020-06-02 NOTE — Progress Notes (Signed)
OFFICE VISIT  06/03/2020  CC:  Chief Complaint  Patient presents with  . Follow-up    RCI, pt is fasting    HPI:    Patient is a 60 y.o. Caucasian male who presents for 6 mo f/u HTN, HLD, and prediabetes. A/P as of last visit: "1) HTN: avg bp 130/80.  Continue to monitor at home, call or return if persistently >140/90 or <100/60.  Lytes/cr today.  RF for lisin/hct 20-12.5 TWO tabs qd sent today.  2) HLD: tolerating statin.  He knows he needs to try to improve TLC---life too busy lately.  3) Health maintenance exam: Reviewed age and gender appropriate health maintenance issues (prudent diet, regular exercise, health risks of tobacco and excessive alcohol, use of seatbelts, fire alarms in home, use of sunscreen).  Also reviewed age and gender appropriate health screening as well as vaccine recommendations. Vaccines:  All utd. Covid vaccine->UTD. Labs: fasting CBC, Lipids, CMET, A1c, PSA all ordered (IFG, HTN, HLD). Prostate ca screening: DRE normal, PSA. Colon ca screening: he was due for rpt colonoscopy 2020-->pt plans to schedule this soon."  INTERIM HX: Last visit his a1c went up from 5.6% to 6.2%.  Offered metformin vs get more aggressive with TLC and he chose the latter.  Unfortunately around Oct 2021 he starting getting depressed due to some bad family issues, daughter disowned him.  He threw himself into work a lot, stopped eating well, felt sad all the time.  Distancing himself from old friends.  Has lost 12 lb w/out trying---just not eating well for a while. However, says he's coming out of it some.  Starting to reach out to some old friends and go out more.  Crying spells less often.  NO SI or HI.  He does not want antidepressant.  Denies signif periods of anxiety or panic.  No hypomanic/manic sx's.  Endorses cold intol the last year or more. Some rosacea coming and going. No color changes in hands or feet.   Has some tingling in feet lately.   Has hx of vit B12 def,  takes otc supp daily.  HTN: home bp's some normal and some 150s syst and /or diast 90s.  HR 60 avg.  ROS: see above, plus->no fevers, no CP, no SOB, no wheezing, no cough, no dizziness, no HAs, no rashes, no melena/hematochezia.  No polyuria or polydipsia.  No myalgias or arthralgias.  No focal weakness, paresthesias, or tremors.  No acute vision or hearing abnormalities. No n/v/d or abd pain.  No palpitations.    Past Medical History:  Diagnosis Date  . Chronic renal insufficiency, stage 2 (mild) 2017   GFR 60s  . Diverticulitis   . Elevated transaminase level 2017   Mild: fatty liver on u/s 11/2015  . Granuloma annulare 02/20/2008   Dr. Everlene Farrier  . Hyperlipemia, mixed    TLC recommended 2017.  Statin started 07/2017.  Marland Kitchen Hypertension   . Obesity, Class I, BMI 30-34.9   . Prediabetes 2017   gluc 131.  04/2017 fasting gluc 119, A1c 6.0%.  A1c stable 01/2018 and 10/2018. a1c up to 6.2% 10/2019  . TIA (transient ischemic attack) 07/2017   w/u unrevealing (CT head/MRI head/echo/carotid dopplers.  Plavix + ASA x 3 wks, then plavix alone for secondary prevention.  Doing well as of 12/2017 neuro f/u.    Past Surgical History:  Procedure Laterality Date  . BICEPS TENDON REPAIR Right    2'16 -Dr. Amanda Pea  . COLONOSCOPY  approx 2010   normal.  Recall 10 yrs.  Marland Kitchen HAMMER TOE SURGERY  2015  . LAPAROSCOPIC SIGMOID COLECTOMY N/A 01/31/2015   For diverticulitis.  Procedure: LAPAROSCOPIC SIGMOID COLECTOMY SPLENIC FLEXURE MOBILIZATION;  Surgeon: Axel Filler, MD;  Location: [facility];  Service: General;  Laterality: N/A;  . LIPOMA RESECTION  2009   Dr. Stephens November  . MOUTH SURGERY    . SMALL INTESTINE SURGERY  01/2015   Small bowel anastomosis developed after sigmoid colectomy --repair required.  . TRANSTHORACIC ECHOCARDIOGRAM  07/20/2017   Mild LVH, EF 55-60%, NORMAL.  Marland Kitchen VASECTOMY      Outpatient Medications Prior to Visit  Medication Sig Dispense Refill  . atorvastatin (LIPITOR) 80 MG tablet Take  1 tablet (80 mg total) by mouth daily at 6 PM. 90 tablet 3  . clopidogrel (PLAVIX) 75 MG tablet Take 1 tablet (75 mg total) by mouth daily. 90 tablet 3  . FIBER PO Take 1 tablet by mouth every morning.     Marland Kitchen lisinopril-hydrochlorothiazide (ZESTORETIC) 20-12.5 MG tablet TAKE 2 TABLETS BY MOUTH EVERY DAY 180 tablet 3  . vitamin B-12 (CYANOCOBALAMIN) 1000 MCG tablet Take 1,000 mcg by mouth every morning.      No facility-administered medications prior to visit.    No Known Allergies  ROS As per HPI  PE: Vitals with BMI 06/03/2020 11/02/2019 07/31/2019  Height 6\' 0"  6\' 0"  6\' 0"   Weight 230 lbs 13 oz 242 lbs 13 oz 247 lbs  BMI 31.3 32.92 33.49  Systolic 118 121  Diastolic 67 79 85  Pulse 63 66 70    Gen: Alert, well appearing.  Patient is oriented to person, place, time, and situation. AFFECT: pleasant, lucid thought and speech. CV: RRR, no m/r/g.   LUNGS: CTA bilat, nonlabored resps, good aeration in all lung fields. EXT: no clubbing or cyanosis.  no edema.    LABS:  Lab Results  Component Value Date   TSH 3.57 11/16/2018   Lab Results  Component Value Date   WBC 5.4 11/02/2019   HGB 14.0 11/02/2019   HCT 41.1 11/02/2019   MCV 101.8 (H) 11/02/2019   PLT 173.0 11/02/2019   Lab Results  Component Value Date   CREATININE 1.39 11/02/2019   BUN 21 11/02/2019   NA 138 11/02/2019   K 4.0 11/02/2019   CL 103 11/02/2019   CO2 27 11/02/2019   Lab Results  Component Value Date   ALT 31 11/02/2019   AST 22 11/02/2019   ALKPHOS 35 (L) 11/02/2019   BILITOT 0.7 11/02/2019   Lab Results  Component Value Date   CHOL 134 11/02/2019   Lab Results  Component Value Date   HDL 45.70 11/02/2019   Lab Results  Component Value Date   LDLCALC 60 11/02/2019   Lab Results  Component Value Date   TRIG 140.0 11/02/2019   Lab Results  Component Value Date   CHOLHDL 3 11/02/2019   Lab Results  Component Value Date   PSA 0.75 11/02/2019   PSA 0.99 11/16/2018   PSA 0.94  05/16/2017   Lab Results  Component Value Date   HGBA1C 6.2 11/02/2019    IMPRESSION AND PLAN:  1) HTN: not ideal control but in context of what he's dealing with from a social/mood standpoint lately we decided not to change anything today. Lytes/cr today.  2) Prediabetes: diet not ideal, not exercising-->he is starting to change these habits a bit lately, though. Hba1c and fasting glucose today.  3) HLD: tolerating 80mg  atorva qd. Goal LDL <  70. LDL 60 last check 6 mo ago. FLP today.  4) MDD, mild/mod.  He declines meds. He is improving the last month or so. Has some cold intol so will check a TSH. Also, hx of vit B12 def (remote past, sounds like level not checked but sx's of feet tingling resolved when he got on b12 supplement orally)--feet tingling again.  Check b12 level. He has approx 1 night a week he drinks alcohol but he is not persistently over-drinking, does not display problematic drinking behavior.  An After Visit Summary was printed and given to the patient.  FOLLOW UP: Return in about 6 weeks (around 07/15/2020) for f/u HTN and mood.  Signed:  Santiago Bumpers, MD           06/03/2020

## 2020-06-03 ENCOUNTER — Encounter: Payer: Self-pay | Admitting: Family Medicine

## 2020-06-03 ENCOUNTER — Other Ambulatory Visit: Payer: Self-pay

## 2020-06-03 ENCOUNTER — Ambulatory Visit (INDEPENDENT_AMBULATORY_CARE_PROVIDER_SITE_OTHER): Payer: BLUE CROSS/BLUE SHIELD | Admitting: Family Medicine

## 2020-06-03 VITALS — BP 118/67 | HR 63 | Temp 97.5°F | Resp 16 | Ht 72.0 in | Wt 230.8 lb

## 2020-06-03 DIAGNOSIS — F320 Major depressive disorder, single episode, mild: Secondary | ICD-10-CM | POA: Diagnosis not present

## 2020-06-03 DIAGNOSIS — I10 Essential (primary) hypertension: Secondary | ICD-10-CM | POA: Diagnosis not present

## 2020-06-03 DIAGNOSIS — R6889 Other general symptoms and signs: Secondary | ICD-10-CM

## 2020-06-03 DIAGNOSIS — E7800 Pure hypercholesterolemia, unspecified: Secondary | ICD-10-CM

## 2020-06-03 DIAGNOSIS — E538 Deficiency of other specified B group vitamins: Secondary | ICD-10-CM

## 2020-06-03 DIAGNOSIS — R7303 Prediabetes: Secondary | ICD-10-CM | POA: Diagnosis not present

## 2020-06-03 DIAGNOSIS — Z23 Encounter for immunization: Secondary | ICD-10-CM

## 2020-06-03 LAB — TSH: TSH: 3.02 u[IU]/mL (ref 0.35–4.50)

## 2020-06-03 LAB — BASIC METABOLIC PANEL
BUN: 15 mg/dL (ref 6–23)
CO2: 26 mEq/L (ref 19–32)
Calcium: 9 mg/dL (ref 8.4–10.5)
Chloride: 101 mEq/L (ref 96–112)
Creatinine, Ser: 1.25 mg/dL (ref 0.40–1.50)
GFR: 62.55 mL/min (ref >60.00)
Glucose, Bld: 118 mg/dL — ABNORMAL HIGH (ref 70–99)
Potassium: 3.7 mEq/L (ref 3.5–5.1)
Sodium: 137 mEq/L (ref 135–145)

## 2020-06-03 LAB — LIPID PANEL
Cholesterol: 113 mg/dL (ref 0–200)
HDL: 39.8 mg/dL (ref >39.00)
LDL Cholesterol: 45 mg/dL (ref 0–99)
NonHDL: 72.73
Total CHOL/HDL Ratio: 3
Triglycerides: 138 mg/dL (ref 0.0–149.0)
VLDL: 27.6 mg/dL (ref 0.0–40.0)

## 2020-06-03 LAB — VITAMIN B12: Vitamin B-12: 596 pg/mL (ref 211–911)

## 2020-06-03 LAB — HEMOGLOBIN A1C: Hgb A1c MFr Bld: 6 % (ref 4.6–6.5)

## 2020-07-18 ENCOUNTER — Ambulatory Visit: Payer: BLUE CROSS/BLUE SHIELD | Admitting: Family Medicine

## 2020-07-28 ENCOUNTER — Encounter: Payer: Self-pay | Admitting: Family Medicine

## 2020-07-28 ENCOUNTER — Ambulatory Visit (INDEPENDENT_AMBULATORY_CARE_PROVIDER_SITE_OTHER): Payer: BLUE CROSS/BLUE SHIELD | Admitting: Family Medicine

## 2020-07-28 VITALS — BP 117/70 | HR 72 | Temp 97.6°F | Resp 12 | Wt 234.0 lb

## 2020-07-28 DIAGNOSIS — I10 Essential (primary) hypertension: Secondary | ICD-10-CM

## 2020-07-28 DIAGNOSIS — F320 Major depressive disorder, single episode, mild: Secondary | ICD-10-CM | POA: Diagnosis not present

## 2020-07-28 DIAGNOSIS — M79674 Pain in right toe(s): Secondary | ICD-10-CM

## 2020-07-28 NOTE — Progress Notes (Signed)
OFFICE VISIT  07/28/2020  CC: f/u mood and bp  HPI:    Patient is a 60 y.o. Caucasian male who presents for 2 mo f/u HTN and depression. A/P as of last visit: "1) HTN: not ideal control but in context of what he's dealing with from a social/mood standpoint lately we decided not to change anything today. Lytes/cr today.  2) Prediabetes: diet not ideal, not exercising-->he is starting to change these habits a bit lately, though. Hba1c and fasting glucose today.  3) HLD: tolerating 80mg  atorva qd. Goal LDL <70. LDL 60 last check 6 mo ago. FLP today.  4) MDD, mild/mod.  He declines meds. He is improving the last month or so. Has some cold intol so will check a TSH. Also, hx of vit B12 def (remote past, sounds like level not checked but sx's of feet tingling resolved when he got on b12 supplement orally)--feet tingling again.  Check b12 level. He has approx 1 night a week he drinks alcohol but he is not persistently over-drinking, does not display problematic drinking behavior."  INTERIM HX: Labs good last visit except fasting gluc 118.  A1c was improved at 6.0%.  Mood is signif improved, says he feels "back to normal". Spring season helping this, he's traveling more for work again and this helps.   Also says overall family situation better/more stable lately.  Home bp's lately 140s/80s, HR 60s--->7 measurements over the last 10d---always morning. Other contrib factors: taking advil regularly last few weeks for R big toe pain, no injury. No known hx of gout but this sounds like it: recurs w/out physical or traumatic trigger 1-2 times a year and each time he thinks maybe he has just sprained it or re-aggravated an old injury or something.  It swells, hurts to have shoe on it. Today he says it is much improved and he has not taken any advil. No other joints bother him.   Past Medical History:  Diagnosis Date  . Chronic renal insufficiency, stage 2 (mild) 2017   GFR 60s  .  Diverticulitis   . Elevated transaminase level 2017   Mild: fatty liver on u/s 11/2015  . Granuloma annulare 02/20/2008   Dr. 13/06/2007  . Hyperlipemia, mixed    TLC recommended 2017.  Statin started 07/2017.  08/2017 Hypertension   . Obesity, Class I, BMI 30-34.9   . Prediabetes 2017   gluc 131.  04/2017 fasting gluc 119, A1c 6.0%.  A1c stable 01/2018 and 10/2018. a1c up to 6.2% 10/2019  . TIA (transient ischemic attack) 07/2017   w/u unrevealing (CT head/MRI head/echo/carotid dopplers.  Plavix + ASA x 3 wks, then plavix alone for secondary prevention.  Doing well as of 12/2017 neuro f/u.    Past Surgical History:  Procedure Laterality Date  . BICEPS TENDON REPAIR Right    2'16 -Dr. 01/2018  . COLONOSCOPY  approx 2010   normal.  Recall 10 yrs.  Amanda Pea HAMMER TOE SURGERY  2015  . LAPAROSCOPIC SIGMOID COLECTOMY N/A 01/31/2015   For diverticulitis.  Procedure: LAPAROSCOPIC SIGMOID COLECTOMY SPLENIC FLEXURE MOBILIZATION;  Surgeon: 02/02/2015, MD;  Location: [facility];  Service: General;  Laterality: N/A;  . LIPOMA RESECTION  2009   Dr. 2010  . MOUTH SURGERY    . SMALL INTESTINE SURGERY  01/2015   Small bowel anastomosis developed after sigmoid colectomy --repair required.  . TRANSTHORACIC ECHOCARDIOGRAM  07/20/2017   Mild LVH, EF 55-60%, NORMAL.  09/19/2017 VASECTOMY      Outpatient Medications  Prior to Visit  Medication Sig Dispense Refill  . atorvastatin (LIPITOR) 80 MG tablet Take 1 tablet (80 mg total) by mouth daily at 6 PM. 90 tablet 3  . clopidogrel (PLAVIX) 75 MG tablet Take 1 tablet (75 mg total) by mouth daily. 90 tablet 3  . FIBER PO Take 1 tablet by mouth every morning.     Marland Kitchen lisinopril-hydrochlorothiazide (ZESTORETIC) 20-12.5 MG tablet TAKE 2 TABLETS BY MOUTH EVERY DAY 180 tablet 3  . vitamin B-12 (CYANOCOBALAMIN) 1000 MCG tablet Take 1,000 mcg by mouth every morning.      No facility-administered medications prior to visit.    No Known Allergies  ROS As per HPI  PE: Vitals  with BMI 06/03/2020 11/02/2019 07/31/2019  Height 6\' 0"  6\' 0"  6\' 0"   Weight 230 lbs 13 oz 242 lbs 13 oz 247 lbs  BMI 31.3 32.92 33.49  Systolic 118 121  Diastolic 67 79 85  Pulse 63 66 70     Gen: Alert, well appearing.  Patient is oriented to person, place, time, and situation. AFFECT: pleasant, lucid thought and speech. R great toe MTP joint with mild swelling, mild warmth, mildly violaceous hue, signif TTP over MTP jt.  ROM fairly well intact.  LABS:  Lab Results  Component Value Date   TSH 3.02 06/03/2020   Lab Results  Component Value Date   WBC 5.4 11/02/2019   HGB 14.0 11/02/2019   HCT 41.1 11/02/2019   MCV 101.8 (H) 11/02/2019   PLT 173.0 11/02/2019   Lab Results  Component Value Date   CREATININE 1.25 06/03/2020   BUN 15 06/03/2020   NA 137 06/03/2020   K 3.7 06/03/2020   CL 101 06/03/2020   CO2 26 06/03/2020   Lab Results  Component Value Date   ALT 31 11/02/2019   AST 22 11/02/2019   ALKPHOS 35 (L) 11/02/2019   BILITOT 0.7 11/02/2019   Lab Results  Component Value Date   CHOL 113 06/03/2020   Lab Results  Component Value Date   HDL 39.80 06/03/2020   Lab Results  Component Value Date   LDLCALC 45 06/03/2020   Lab Results  Component Value Date   TRIG 138.0 06/03/2020   Lab Results  Component Value Date   CHOLHDL 3 06/03/2020   Lab Results  Component Value Date   PSA 0.75 11/02/2019   PSA 0.99 11/16/2018   PSA 0.94 05/16/2017   Lab Results  Component Value Date   HGBA1C 6.0 06/03/2020   Lab Results  Component Value Date   VITAMINB12 596 06/03/2020   IMPRESSION AND PLAN:  1) Depression: this has lifted. He did not required any meds. Obs.  2) HTN: question of uncontrolled (mild) at home with all morning checks before he takes his med, with contributing factors lately of acute R MTP pain AND regular dosing of NSAIDs for the pain. Plan: no change in meds (two lisin-hctz 20-12.5 tabs qd). Check some afternoon bp's, stop  NSAIDs. Drop off bp numbers in the next 10-14d for me to review.  3) Recurrent R MTP pain.  Suspect gout, 1-2x/year so he doesn't want to start preventative med and declined uric acid check today. Current flare seems much improved today. Obs.  Low purine diet handout reviewed/given to pt.  An After Visit Summary was printed and given to the patient.  FOLLOW UP: 3-6 mo cpe  Signed:  05/18/2017, MD           07/28/2020

## 2020-09-01 ENCOUNTER — Telehealth: Payer: Self-pay | Admitting: Family Medicine

## 2020-09-01 MED ORDER — AMLODIPINE BESYLATE 5 MG PO TABS: 5.0000 mg | ORAL_TABLET | Freq: Every day | ORAL | 0 refills | Status: DC

## 2020-09-01 NOTE — Telephone Encounter (Signed)
Reviewed bps over the last mo that pt dropped off. Avg 145/88, HR ranged 55-77. Pls call him and tell him I want to ADD a low dose of a second blood pressure pill. He should CONTINUE taking his current bp med as well (lisin-hct). Check bp and heart rate daily for the next 2 wks and make appt for approx 2 wks from now to review these with me in office. Pls eRx amlodipine 5mg , 1 tab po qd, #30, no RF.-thx

## 2020-09-01 NOTE — Telephone Encounter (Signed)
Spoke with patient regarding results/recommendations.  

## 2020-09-22 ENCOUNTER — Other Ambulatory Visit: Payer: Self-pay

## 2020-09-22 ENCOUNTER — Encounter: Payer: Self-pay | Admitting: Family Medicine

## 2020-09-22 ENCOUNTER — Ambulatory Visit (INDEPENDENT_AMBULATORY_CARE_PROVIDER_SITE_OTHER): Payer: BLUE CROSS/BLUE SHIELD | Admitting: Family Medicine

## 2020-09-22 VITALS — BP 100/60 | HR 58 | Temp 97.6°F | Resp 16 | Ht 72.0 in | Wt 233.2 lb

## 2020-09-22 DIAGNOSIS — I10 Essential (primary) hypertension: Secondary | ICD-10-CM

## 2020-09-22 MED ORDER — AMLODIPINE BESYLATE 5 MG PO TABS: 5.0000 mg | ORAL_TABLET | Freq: Every day | ORAL | 3 refills | Status: DC

## 2020-09-22 MED ORDER — ATORVASTATIN CALCIUM 80 MG PO TABS
80.0000 mg | ORAL_TABLET | Freq: Every day | ORAL | 3 refills | Status: DC
Start: 2020-09-22 — End: 2020-11-05

## 2020-09-22 MED ORDER — LISINOPRIL-HYDROCHLOROTHIAZIDE 20-12.5 MG PO TABS
ORAL_TABLET | ORAL | 3 refills | Status: DC
Start: 2020-09-22 — End: 2020-11-05

## 2020-09-22 MED ORDER — CLOPIDOGREL BISULFATE 75 MG PO TABS: 75.0000 mg | ORAL_TABLET | Freq: Every day | ORAL | 3 refills | Status: DC

## 2020-09-22 NOTE — Progress Notes (Signed)
OFFICE VISIT  09/22/2020  CC:  Chief Complaint  Patient presents with  . Follow-up    hypertension    HPI:    Patient is a 60 y.o. Caucasian male who presents for 2 mo f/u uncontrolled HTN. On 09/01/20 I rx'd amlodipine 5mg  qd to add to his lisin-hct 20-12.5 two tabs qd.  Feeling well, says "more calm" since I saw him last.  A couple of random brief periods of feeling tired but not correlated with any bp abnormalities.  Trying to maintain hydration but occ does get mild dehyd working in heat in the yard.  Denies side effect from amlodipine. No recent changes in exercise habits ("weekend warrier") or diet ("lots of junk".   HTN: home bp's from 5/17 to 6/5 reviewed today (total of 15 measurements) show the avg the initial couple of weeks to be about 140/low 80s.  The next couple weeks avg 120/70, HR consistently 60s. Lowest bp 103/64 at home.  Today here in office it is 100/60 manually.   Past Medical History:  Diagnosis Date  . Chronic renal insufficiency, stage 2 (mild) 2017   GFR 60s  . Diverticulitis   . Elevated transaminase level 2017   Mild: fatty liver on u/s 11/2015  . Granuloma annulare 02/20/2008   Dr. 13/06/2007  . Hyperlipemia, mixed    TLC recommended 2017.  Statin started 07/2017.  08/2017 Hypertension   . Obesity, Class I, BMI 30-34.9   . Prediabetes 2017   gluc 131.  04/2017 fasting gluc 119, A1c 6.0%.  A1c stable 01/2018 and 10/2018. a1c up to 6.2% 10/2019  . TIA (transient ischemic attack) 07/2017   w/u unrevealing (CT head/MRI head/echo/carotid dopplers.  Plavix + ASA x 3 wks, then plavix alone for secondary prevention.  Doing well as of 12/2017 neuro f/u.    Past Surgical History:  Procedure Laterality Date  . BICEPS TENDON REPAIR Right    2'16 -Dr. 01/2018  . COLONOSCOPY  approx 2010   normal.  Recall 10 yrs.  Amanda Pea HAMMER TOE SURGERY  2015  . LAPAROSCOPIC SIGMOID COLECTOMY N/A 01/31/2015   For diverticulitis.  Procedure: LAPAROSCOPIC SIGMOID COLECTOMY SPLENIC FLEXURE  MOBILIZATION;  Surgeon: 02/02/2015, MD;  Location: [facility];  Service: General;  Laterality: N/A;  . LIPOMA RESECTION  2009   Dr. 2010  . MOUTH SURGERY    . SMALL INTESTINE SURGERY  01/2015   Small bowel anastomosis developed after sigmoid colectomy --repair required.  . TRANSTHORACIC ECHOCARDIOGRAM  07/20/2017   Mild LVH, EF 55-60%, NORMAL.  09/19/2017 VASECTOMY      Outpatient Medications Prior to Visit  Medication Sig Dispense Refill  . amLODipine (NORVASC) 5 MG tablet Take 1 tablet (5 mg total) by mouth daily. 30 tablet 0  . atorvastatin (LIPITOR) 80 MG tablet Take 1 tablet (80 mg total) by mouth daily at 6 PM. 90 tablet 3  . clopidogrel (PLAVIX) 75 MG tablet Take 1 tablet (75 mg total) by mouth daily. 90 tablet 3  . FIBER PO Take 1 tablet by mouth every morning.     Marland Kitchen lisinopril-hydrochlorothiazide (ZESTORETIC) 20-12.5 MG tablet TAKE 2 TABLETS BY MOUTH EVERY DAY 180 tablet 3  . vitamin B-12 (CYANOCOBALAMIN) 1000 MCG tablet Take 1,000 mcg by mouth every morning.      No facility-administered medications prior to visit.    No Known Allergies  ROS As per HPI  PE: Vitals with BMI 09/22/2020 07/28/2020 06/03/2020  Height 6\' 0"  - 6\' 0"   Weight 233 lbs 3  oz 234 lbs 230 lbs 13 oz  BMI 31.62 - 31.3  Systolic 100 117 301  Diastolic 60 70 67  Pulse 58 72 63    Gen: Alert, well appearing.  Patient is oriented to person, place, time, and situation. AFFECT: pleasant, lucid thought and speech. CV: RRR, no m/r/g.   LUNGS: CTA bilat, nonlabored resps, good aeration in all lung fields. EXT: no clubbing or cyanosis.  no edema.    LABS:  Lab Results  Component Value Date   TSH 3.02 06/03/2020   Lab Results  Component Value Date   WBC 5.4 11/02/2019   HGB 14.0 11/02/2019   HCT 41.1 11/02/2019   MCV 101.8 (H) 11/02/2019   PLT 173.0 11/02/2019   Lab Results  Component Value Date   CREATININE 1.25 06/03/2020   BUN 15 06/03/2020   NA 137 06/03/2020   K 3.7 06/03/2020   CL 101  06/03/2020   CO2 26 06/03/2020   Lab Results  Component Value Date   ALT 31 11/02/2019   AST 22 11/02/2019   ALKPHOS 35 (L) 11/02/2019   BILITOT 0.7 11/02/2019   Lab Results  Component Value Date   CHOL 113 06/03/2020   Lab Results  Component Value Date   HDL 39.80 06/03/2020   Lab Results  Component Value Date   LDLCALC 45 06/03/2020   Lab Results  Component Value Date   TRIG 138.0 06/03/2020   Lab Results  Component Value Date   CHOLHDL 3 06/03/2020   Lab Results  Component Value Date   PSA 0.75 11/02/2019   PSA 0.99 11/16/2018   PSA 0.94 05/16/2017   Lab Results  Component Value Date   HGBA1C 6.0 06/03/2020   IMPRESSION AND PLAN:  HTN, well controlled. A couple "soft" bps but asymptomatic. Cont amlodipine 5mg  qd and lisin-hctz 20-12.5 TWO tabs qd. Cont home bp and hr monitoring and call/return if consistently <110/60. No labs needed today.  An After Visit Summary was printed and given to the patient.  FOLLOW UP: Return in about 6 months (around 03/24/2021) for annual CPE (fasting).  Signed:  14/09/2020, MD           09/22/2020

## 2020-11-05 ENCOUNTER — Other Ambulatory Visit: Payer: Self-pay

## 2020-11-05 ENCOUNTER — Telehealth: Payer: Self-pay | Admitting: Family Medicine

## 2020-11-05 MED ORDER — AMLODIPINE BESYLATE 5 MG PO TABS: 5.0000 mg | ORAL_TABLET | Freq: Every day | ORAL | 1 refills | Status: DC

## 2020-11-05 MED ORDER — LISINOPRIL-HYDROCHLOROTHIAZIDE 20-12.5 MG PO TABS: ORAL_TABLET | ORAL | 1 refills | Status: DC

## 2020-11-05 MED ORDER — ATORVASTATIN CALCIUM 80 MG PO TABS: 80.0000 mg | ORAL_TABLET | Freq: Every day | ORAL | 1 refills | Status: DC

## 2020-11-05 MED ORDER — CLOPIDOGREL BISULFATE 75 MG PO TABS: 75.0000 mg | ORAL_TABLET | Freq: Every day | ORAL | 1 refills | Status: DC

## 2020-11-05 NOTE — Telephone Encounter (Signed)
Patient requesting hard copy Rxs for atorvastatin and amlodipine. Please call patient when ready to pick up.

## 2020-11-05 NOTE — Telephone Encounter (Signed)
Rx printed. Pt aware.

## 2021-03-24 ENCOUNTER — Encounter: Payer: Self-pay | Admitting: Family Medicine

## 2021-03-24 ENCOUNTER — Ambulatory Visit (INDEPENDENT_AMBULATORY_CARE_PROVIDER_SITE_OTHER): Payer: BLUE CROSS/BLUE SHIELD | Admitting: Family Medicine

## 2021-03-24 ENCOUNTER — Other Ambulatory Visit: Payer: Self-pay

## 2021-03-24 VITALS — BP 133/73 | HR 55 | Temp 97.7°F | Ht 73.0 in | Wt 229.0 lb

## 2021-03-24 DIAGNOSIS — Z0000 Encounter for general adult medical examination without abnormal findings: Secondary | ICD-10-CM

## 2021-03-24 DIAGNOSIS — I10 Essential (primary) hypertension: Secondary | ICD-10-CM

## 2021-03-24 DIAGNOSIS — Z23 Encounter for immunization: Secondary | ICD-10-CM

## 2021-03-24 DIAGNOSIS — R7303 Prediabetes: Secondary | ICD-10-CM

## 2021-03-24 DIAGNOSIS — Z125 Encounter for screening for malignant neoplasm of prostate: Secondary | ICD-10-CM

## 2021-03-24 DIAGNOSIS — E782 Mixed hyperlipidemia: Secondary | ICD-10-CM

## 2021-03-24 LAB — CBC WITH DIFFERENTIAL/PLATELET
Basophils Absolute: 0 10*3/uL (ref 0.0–0.1)
Basophils Relative: 0.6 % (ref 0.0–3.0)
Eosinophils Absolute: 0.1 10*3/uL (ref 0.0–0.7)
Eosinophils Relative: 1.7 % (ref 0.0–5.0)
HCT: 39.8 % (ref 39.0–52.0)
Hemoglobin: 13.4 g/dL (ref 13.0–17.0)
Lymphocytes Relative: 51.8 % — ABNORMAL HIGH (ref 12.0–46.0)
Lymphs Abs: 2.1 10*3/uL (ref 0.7–4.0)
MCHC: 33.7 g/dL (ref 30.0–36.0)
MCV: 102.5 fl — ABNORMAL HIGH (ref 78.0–100.0)
Monocytes Absolute: 0.3 10*3/uL (ref 0.1–1.0)
Monocytes Relative: 7.9 % (ref 3.0–12.0)
Neutro Abs: 1.5 10*3/uL (ref 1.4–7.7)
Neutrophils Relative %: 38 % — ABNORMAL LOW (ref 43.0–77.0)
Platelets: 179 10*3/uL (ref 150.0–400.0)
RBC: 3.88 Mil/uL — ABNORMAL LOW (ref 4.22–5.81)
RDW: 13.6 % (ref 11.5–15.5)
WBC: 4 10*3/uL (ref 4.0–10.5)

## 2021-03-24 LAB — PSA: PSA: 0.95 ng/mL (ref 0.10–4.00)

## 2021-03-24 LAB — LIPID PANEL
Cholesterol: 138 mg/dL (ref 0–200)
HDL: 63.2 mg/dL (ref >39.00)
LDL Cholesterol: 52 mg/dL (ref 0–99)
NonHDL: 75.07
Total CHOL/HDL Ratio: 2
Triglycerides: 117 mg/dL (ref 0.0–149.0)
VLDL: 23.4 mg/dL (ref 0.0–40.0)

## 2021-03-24 LAB — COMPREHENSIVE METABOLIC PANEL
ALT: 20 U/L (ref 0–53)
AST: 19 U/L (ref 0–37)
Albumin: 4.4 g/dL (ref 3.5–5.2)
Alkaline Phosphatase: 35 U/L — ABNORMAL LOW (ref 39–117)
BUN: 22 mg/dL (ref 6–23)
CO2: 26 mEq/L (ref 19–32)
Calcium: 9.1 mg/dL (ref 8.4–10.5)
Chloride: 102 mEq/L (ref 96–112)
Creatinine, Ser: 1.38 mg/dL (ref 0.40–1.50)
GFR: 55.24 mL/min — ABNORMAL LOW (ref >60.00)
Glucose, Bld: 112 mg/dL — ABNORMAL HIGH (ref 70–99)
Potassium: 4.2 mEq/L (ref 3.5–5.1)
Sodium: 136 mEq/L (ref 135–145)
Total Bilirubin: 0.8 mg/dL (ref 0.2–1.2)
Total Protein: 6.8 g/dL (ref 6.0–8.3)

## 2021-03-24 LAB — HEMOGLOBIN A1C: Hgb A1c MFr Bld: 5.9 % (ref 4.6–6.5)

## 2021-03-24 LAB — TSH: TSH: 5.2 u[IU]/mL (ref 0.35–5.50)

## 2021-03-24 MED ORDER — LISINOPRIL-HYDROCHLOROTHIAZIDE 20-12.5 MG PO TABS: ORAL_TABLET | ORAL | 3 refills | Status: DC

## 2021-03-24 MED ORDER — CLOPIDOGREL BISULFATE 75 MG PO TABS: 75.0000 mg | ORAL_TABLET | Freq: Every day | ORAL | 3 refills | Status: DC

## 2021-03-24 MED ORDER — ATORVASTATIN CALCIUM 80 MG PO TABS: 80.0000 mg | ORAL_TABLET | Freq: Every day | ORAL | 3 refills | Status: DC

## 2021-03-24 MED ORDER — AMLODIPINE BESYLATE 5 MG PO TABS: 5.0000 mg | ORAL_TABLET | Freq: Every day | ORAL | 1 refills | Status: DC

## 2021-03-24 NOTE — Patient Instructions (Signed)
Shoulder Impingement Syndrome Rehab Ask your health care provider which exercises are safe for you. Do exercises exactly as told by your health care provider and adjust them as directed. It is normal to feel mild stretching, pulling, tightness, or discomfort as you do these exercises. Stop right away if you feel sudden pain or your pain gets worse. Do not begin these exercises until told by your health care provider. Stretching and range-of-motion exercise This exercise warms up your muscles and joints and improves the movement and flexibility of your shoulder. This exercise also helps to relieve pain andstiffness. Passive horizontal adduction In passive adduction, you use your other hand to move the injured arm toward your body. The injured arm does not move on its own. In this movement, your arm is moved across your body in the horizontal plane (horizontal adduction). Sit or stand and pull your left / right elbow across your chest, toward your other shoulder. Stop when you feel a gentle stretch in the back of your shoulder and upper arm. Keep your arm at shoulder height. Keep your arm as close to your body as you comfortably can. Hold for __________ seconds. Slowly return to the starting position. Repeat __________ times. Complete this exercise __________ times a day. Strengthening exercises These exercises build strength and endurance in your shoulder. Endurance is theability to use your muscles for a long time, even after they get tired. External rotation, isometric This is an exercise in which you press the back of your wrist against a door frame without moving your shoulder joint (isometric). Stand or sit in a doorway, facing the door frame. Bend your left / right elbow and place the back of your wrist against the door frame. Only the back of your wrist should be touching the frame. Keep your upper arm at your side. Gently press your wrist against the door frame, as if you are trying to push  your arm away from your abdomen (external rotation). Press as hard as you are able without pain. Avoid shrugging your shoulder while you press your wrist against the door frame. Keep your shoulder blade tucked down toward the middle of your back. Hold for __________ seconds. Slowly release the tension, and relax your muscles completely before you repeat the exercise. Repeat __________ times. Complete this exercise __________ times a day. Internal rotation, isometric This is an exercise in which you press your palm against a door frame without moving your shoulder joint (isometric). Stand or sit in a doorway, facing the door frame. Bend your left / right elbow and place the palm of your hand against the door frame. Only your palm should be touching the frame. Keep your upper arm at your side. Gently press your hand against the door frame, as if you are trying to push your arm toward your abdomen (internal rotation). Press as hard as you are able without pain. Avoid shrugging your shoulder while you press your hand against the door frame. Keep your shoulder blade tucked down toward the middle of your back. Hold for __________ seconds. Slowly release the tension, and relax your muscles completely before you repeat the exercise. Repeat __________ times. Complete this exercise __________ times a day. Scapular protraction, supine  Lie on your back on a firm surface (supine position). Hold a __________ weight in your left / right hand. Raise your left / right arm straight into the air so your hand is directly above your shoulder joint. Push the weight into the air so your shoulder (  scapula) lifts off the surface that you are lying on. The scapula will push up or forward (protraction). Do not move your head, neck, or back. Hold for __________ seconds. Slowly return to the starting position. Let your muscles relax completely before you repeat this exercise. Repeat __________ times. Complete this exercise  __________ times a day. Scapular retraction  Sit in a stable chair without armrests, or stand up. Secure an exercise band to a stable object in front of you so the band is at shoulder height. Hold one end of the exercise band in each hand. Your palms should face down. Squeeze your shoulder blades together (retraction) and move your elbows slightly behind you. Do not shrug your shoulders upward while you do this. Hold for __________ seconds. Slowly return to the starting position. Repeat __________ times. Complete this exercise __________ times a day. Shoulder extension  Sit in a stable chair without armrests, or stand up. Secure an exercise band to a stable object in front of you so the band is above shoulder height. Hold one end of the exercise band in each hand. Straighten your elbows and lift your hands up to shoulder height. Squeeze your shoulder blades together and pull your hands down to the sides of your thighs (extension). Stop when your hands are straight down by your sides. Do not let your hands go behind your body. Hold for __________ seconds. Slowly return to the starting position. Repeat __________ times. Complete this exercise __________ times a day. This information is not intended to replace advice given to you by your health care provider. Make sure you discuss any questions you have with your healthcare provider. Document Revised: 07/28/2018 Document Reviewed: 05/01/2018 [employer] Patient Education  2022 [employer]  

## 2021-03-24 NOTE — Progress Notes (Signed)
Office Note 03/24/2021  CC:  Chief Complaint  Patient presents with   Annual Exam    Pt is fasting    HPI:  Patient is a 60 y.o. male who is here for annual health maintenance exam and 6 mo f/u HTN, HLD, and prediabetes. A/P as of last visit: "HTN, well controlled. A couple "soft" bps but asymptomatic. Cont amlodipine 5mg  qd and lisin-hctz 20-12.5 TWO tabs qd. Cont home bp and hr monitoring and call/return if consistently <110/60. No labs needed today."  INTERIM HX: Ardian says he feels well. Occasional home blood pressure checks shows consistently normal blood pressure. He remains active and feels like his diet is fair  Past Medical History:  Diagnosis Date   Chronic renal insufficiency, stage 2 (mild) 2017   GFR 60s   Diverticulitis    Elevated transaminase level 2017   Mild: fatty liver on u/s 11/2015   Granuloma annulare 02/20/2008   Dr. 13/06/2007   Hyperlipemia, mixed    TLC recommended 2017.  Statin started 07/2017.   Hypertension    Obesity, Class I, BMI 30-34.9    Prediabetes 2017   gluc 131.  04/2017 fasting gluc 119, A1c 6.0%.  A1c stable 01/2018 and 10/2018. a1c up to 6.2% 10/2019   TIA (transient ischemic attack) 07/2017   w/u unrevealing (CT head/MRI head/echo/carotid dopplers.  Plavix + ASA x 3 wks, then plavix alone for secondary prevention.  Doing well as of 12/2017 neuro f/u.    Past Surgical History:  Procedure Laterality Date   BICEPS TENDON REPAIR Right    2'16 -Dr. 05-09-1976   COLONOSCOPY  approx 2010   normal.  Recall 10 yrs.   HAMMER TOE SURGERY  2015   LAPAROSCOPIC SIGMOID COLECTOMY N/A 01/31/2015   For diverticulitis.  Procedure: LAPAROSCOPIC SIGMOID COLECTOMY SPLENIC FLEXURE MOBILIZATION;  Surgeon: 02/02/2015, MD;  Location: [facility];  Service: General;  Laterality: N/A;   LIPOMA RESECTION  2009   Dr. 2010   MOUTH SURGERY     SMALL INTESTINE SURGERY  01/2015   Small bowel anastomosis developed after sigmoid colectomy --repair required.    TRANSTHORACIC ECHOCARDIOGRAM  07/20/2017   Mild LVH, EF 55-60%, NORMAL.   VASECTOMY      Family History  Problem Relation Age of Onset   Gastric cancer Mother    Breast cancer Mother    Hypertension Father    Diabetes Father    Heart disease Father    Stroke Father    Early death Maternal Grandmother        MVA   Early death Maternal Grandfather        MVA   CVA Paternal Grandmother    Alzheimer's disease Paternal Grandfather    Colon cancer Neg Hx    Liver disease Neg Hx     Social History   Socioeconomic History   Marital status: Married    Spouse name: Not on file   Number of children: Not on file   Years of education: Not on file   Highest education level: Not on file  Occupational History   Not on file  Tobacco Use   Smoking status: Former    Packs/day: 0.25    Types: Cigarettes   Smokeless tobacco: Never  Vaping Use   Vaping Use: Never used  Substance and Sexual Activity   Alcohol use: Yes    Alcohol/week: 8.0 standard drinks    Types: 2 Glasses of wine, 6 Cans of beer per week    Comment:  daily   Drug use: No   Sexual activity: Not on file  Other Topics Concern   Not on file  Social History Narrative   Married, 3 daughters (one set of twin daughters), 1 son--all are grown and out of the house.   Educ: colleg grad--BS.   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   Tob: no signif regular smoking.   Alc: 2 drinks per week   Drugs: none   No hx of colon or prostate cancer.   Social Determinants of Health   Financial Resource Strain: Not on file  Food Insecurity: Not on file  Transportation Needs: Not on file  Physical Activity: Not on file  Stress: Not on file  Social Connections: Not on file  Intimate Partner Violence: Not on file    Outpatient Medications Prior to Visit  Medication Sig Dispense Refill   FIBER PO Take 1 tablet by mouth every morning.      vitamin B-12 (CYANOCOBALAMIN) 1000 MCG tablet Take 1,000 mcg by mouth every morning.       amLODipine (NORVASC) 5 MG tablet Take 1 tablet (5 mg total) by mouth daily. 90 tablet 1   atorvastatin (LIPITOR) 80 MG tablet Take 1 tablet (80 mg total) by mouth daily at 6 PM. 90 tablet 1   clopidogrel (PLAVIX) 75 MG tablet Take 1 tablet (75 mg total) by mouth daily. 90 tablet 1   lisinopril-hydrochlorothiazide (ZESTORETIC) 20-12.5 MG tablet TAKE 2 TABLETS BY MOUTH EVERY DAY 180 tablet 1   No facility-administered medications prior to visit.    No Known Allergies  ROS Review of Systems  Constitutional:  Negative for appetite change, chills, fatigue and fever.  HENT:  Negative for congestion, dental problem, ear pain and sore throat.   Eyes:  Negative for discharge, redness and visual disturbance.  Respiratory:  Negative for cough, chest tightness, shortness of breath and wheezing.   Cardiovascular:  Negative for chest pain, palpitations and leg swelling.  Gastrointestinal:  Negative for abdominal pain, blood in stool, diarrhea, nausea and vomiting.  Genitourinary:  Negative for difficulty urinating, dysuria, flank pain, frequency, hematuria and urgency.  Musculoskeletal:  Positive for arthralgias (bilat shoulders--nagging/chronic--lots of overhead working lately). Negative for back pain, joint swelling, myalgias and neck stiffness.  Skin:  Negative for pallor and rash.  Neurological:  Negative for dizziness, speech difficulty, weakness and headaches.  Hematological:  Negative for adenopathy. Does not bruise/bleed easily.  Psychiatric/Behavioral:  Negative for confusion and sleep disturbance. The patient is not nervous/anxious.    PE; Vitals with BMI 03/24/2021 09/22/2020 07/28/2020  Height 6\' 1"  6\' 0"  -  Weight 229 lbs 233 lbs 3 oz 234 lbs  BMI 30.22 31.62 -  Systolic 133 100 [id number]  Diastolic 73 60 70  Pulse 55 58 72   Gen: Alert, well appearing.  Patient is oriented to person, place, time, and situation. AFFECT: pleasant, lucid thought and speech. ENT: Ears: EACs clear, normal  epithelium.  TMs with good light reflex and landmarks bilaterally.  Eyes: no injection, icteris, swelling, or exudate.  EOMI, PERRLA. Nose: no drainage or turbinate edema/swelling.  No injection or focal lesion.  Mouth: lips without lesion/swelling.  Oral mucosa pink and moist.  Dentition intact and without obvious caries or gingival swelling.  Oropharynx without erythema, exudate, or swelling.  Neck: supple/nontender.  No LAD, mass, or TM.  Carotid pulses 2+ bilaterally, without bruits. CV: RRR, no m/r/g.   LUNGS: CTA bilat, nonlabored resps, good aeration in all lung fields. ABD: soft, NT,  ND, BS normal.  No hepatospenomegaly or mass.  No bruits. EXT: no clubbing, cyanosis, or edema.  Musculoskeletal: no joint swelling, erythema, warmth, or tenderness.  ROM of all joints intact. Skin - no sores or suspicious lesions or rashes or color changes  Pertinent labs:  Lab Results  Component Value Date   TSH 3.02 06/03/2020   Lab Results  Component Value Date   WBC 5.4 11/02/2019   HGB 14.0 11/02/2019   HCT 41.1 11/02/2019   MCV 101.8 (H) 11/02/2019   PLT 173.0 11/02/2019   Lab Results  Component Value Date   CREATININE 1.25 06/03/2020   BUN 15 06/03/2020   NA 137 06/03/2020   K 3.7 06/03/2020   CL 101 06/03/2020   CO2 26 06/03/2020   Lab Results  Component Value Date   ALT 31 11/02/2019   AST 22 11/02/2019   ALKPHOS 35 (L) 11/02/2019   BILITOT 0.7 11/02/2019   Lab Results  Component Value Date   CHOL 113 06/03/2020   Lab Results  Component Value Date   HDL 39.80 06/03/2020   Lab Results  Component Value Date   LDLCALC 45 06/03/2020   Lab Results  Component Value Date   TRIG 138.0 06/03/2020   Lab Results  Component Value Date   CHOLHDL 3 06/03/2020   Lab Results  Component Value Date   PSA 0.75 11/02/2019   PSA 0.99 11/16/2018   PSA 0.94 05/16/2017   Lab Results  Component Value Date   HGBA1C 6.0 06/03/2020    ASSESSMENT AND PLAN:   1) HTN: Stable on  lisinopril/HCTZ 20/12.5, 2 tabs per day as well as amlodipine 5 mg/day. Lytes/cr today.  2) HLD: Doing well on atorvastatin 80 mg a day. FLP and hepatic panel today.  3) Prediabetes: Working on lowering carb intake and increasing exercise. Hba1c and fasting glucose today.  4) bilateral shoulder pain.  Suspect a combination of osteoarthritis and rotator cuff tendinopathy.  He may start turmeric daily and I gave him some rotator cuff rehab exercises.  5) Health maintenance exam: Reviewed age and gender appropriate health maintenance issues (prudent diet, regular exercise, health risks of tobacco and excessive alcohol, use of seatbelts, fire alarms in home, use of sunscreen).  Also reviewed age and gender appropriate health screening as well as vaccine recommendations. Vaccines: Flu->given today.  Otherwise all utd. Labs: fasting HP, Hba1c, PSA. Prostate ca screening: PSA. Colon ca screening: he was due for recall 2020-->pt states he contacted them and they said he is not due until 10 yr recall.  An After Visit Summary was printed and given to the patient.  FOLLOW UP:  Return in about 1 year (around 03/24/2022) for routine chronic illness f/u.  Signed:  Crissie Sickles, MD           03/24/2021

## 2021-03-26 ENCOUNTER — Telehealth: Payer: Self-pay | Admitting: Family Medicine

## 2021-03-26 NOTE — Telephone Encounter (Signed)
Spoke with pt regarding results/recommendations,voiced understanding. ? ?

## 2021-10-09 ENCOUNTER — Other Ambulatory Visit: Payer: Self-pay | Admitting: Family Medicine

## 2022-03-25 ENCOUNTER — Encounter: Payer: BLUE CROSS/BLUE SHIELD | Admitting: Family Medicine

## 2022-03-31 NOTE — Patient Instructions (Signed)
Health Maintenance, Male Adopting a healthy lifestyle and getting preventive care are important in promoting health and wellness. Ask your health care provider about: The right schedule for you to have regular tests and exams. Things you can do on your own to prevent diseases and keep yourself healthy. What should I know about diet, weight, and exercise? Eat a healthy diet  Eat a diet that includes plenty of vegetables, fruits, low-fat dairy products, and lean protein. Do not eat a lot of foods that are high in solid fats, added sugars, or sodium. Maintain a healthy weight Body mass index (BMI) is a measurement that can be used to identify possible weight problems. It estimates body fat based on height and weight. Your health care provider can help determine your BMI and help you achieve or maintain a healthy weight. Get regular exercise Get regular exercise. This is one of the most important things you can do for your health. Most adults should: Exercise for at least 150 minutes each week. The exercise should increase your heart rate and make you sweat (moderate-intensity exercise). Do strengthening exercises at least twice a week. This is in addition to the moderate-intensity exercise. Spend less time sitting. Even light physical activity can be beneficial. Watch cholesterol and blood lipids Have your blood tested for lipids and cholesterol at 62 years of age, then have this test every 5 years. You may need to have your cholesterol levels checked more often if: Your lipid or cholesterol levels are high. You are older than 62 years of age. You are at high risk for heart disease. What should I know about cancer screening? Many types of cancers can be detected early and may often be prevented. Depending on your health history and family history, you may need to have cancer screening at various ages. This may include screening for: Colorectal cancer. Prostate cancer. Skin cancer. Lung  cancer. What should I know about heart disease, diabetes, and high blood pressure? Blood pressure and heart disease High blood pressure causes heart disease and increases the risk of stroke. This is more likely to develop in people who have high blood pressure readings or are overweight. Talk with your health care provider about your target blood pressure readings. Have your blood pressure checked: Every 3-5 years if you are 18-39 years of age. Every year if you are 40 years old or older. If you are between the ages of 65 and 75 and are a current or former smoker, ask your health care provider if you should have a one-time screening for abdominal aortic aneurysm (AAA). Diabetes Have regular diabetes screenings. This checks your fasting blood sugar level. Have the screening done: Once every three years after age 45 if you are at a normal weight and have a low risk for diabetes. More often and at a younger age if you are overweight or have a high risk for diabetes. What should I know about preventing infection? Hepatitis B If you have a higher risk for hepatitis B, you should be screened for this virus. Talk with your health care provider to find out if you are at risk for hepatitis B infection. Hepatitis C Blood testing is recommended for: Everyone born from 1945 through 1965. Anyone with known risk factors for hepatitis C. Sexually transmitted infections (STIs) You should be screened each year for STIs, including gonorrhea and chlamydia, if: You are sexually active and are younger than 62 years of age. You are older than 62 years of age and your   health care provider tells you that you are at risk for this type of infection. Your sexual activity has changed since you were last screened, and you are at increased risk for chlamydia or gonorrhea. Ask your health care provider if you are at risk. Ask your health care provider about whether you are at high risk for HIV. Your health care provider  may recommend a prescription medicine to help prevent HIV infection. If you choose to take medicine to prevent HIV, you should first get tested for HIV. You should then be tested every 3 months for as long as you are taking the medicine. Follow these instructions at home: Alcohol use Do not drink alcohol if your health care provider tells you not to drink. If you drink alcohol: Limit how much you have to 0-2 drinks a day. Know how much alcohol is in your drink. In the [place] one drink equals one 12 oz bottle of beer (355 mL), one 5 oz glass of wine (148 mL), or one 1 oz glass of hard liquor (44 mL). Lifestyle Do not use any products that contain nicotine or tobacco. These products include cigarettes, chewing tobacco, and vaping devices, such as e-cigarettes. If you need help quitting, ask your health care provider. Do not use street drugs. Do not share needles. Ask your health care provider for help if you need support or information about quitting drugs. General instructions Schedule regular health, dental, and eye exams. Stay current with your vaccines. Tell your health care provider if: You often feel depressed. You have ever been abused or do not feel safe at home. Summary Adopting a healthy lifestyle and getting preventive care are important in promoting health and wellness. Follow your health care provider's instructions about healthy diet, exercising, and getting tested or screened for diseases. Follow your health care provider's instructions on monitoring your cholesterol and blood pressure. This information is not intended to replace advice given to you by your health care provider. Make sure you discuss any questions you have with your health care provider. Document Revised: 08/25/2020 Document Reviewed: 08/25/2020 [employer]vier Patient Education  2023 [employer]  

## 2022-04-01 ENCOUNTER — Ambulatory Visit (INDEPENDENT_AMBULATORY_CARE_PROVIDER_SITE_OTHER): Payer: BLUE CROSS/BLUE SHIELD | Admitting: Family Medicine

## 2022-04-01 ENCOUNTER — Encounter: Payer: Self-pay | Admitting: Family Medicine

## 2022-04-01 VITALS — BP 103/55 | HR 60 | Temp 97.5°F | Ht 73.0 in | Wt 233.6 lb

## 2022-04-01 DIAGNOSIS — Z125 Encounter for screening for malignant neoplasm of prostate: Secondary | ICD-10-CM

## 2022-04-01 DIAGNOSIS — Z0000 Encounter for general adult medical examination without abnormal findings: Secondary | ICD-10-CM

## 2022-04-01 DIAGNOSIS — I10 Essential (primary) hypertension: Secondary | ICD-10-CM | POA: Diagnosis not present

## 2022-04-01 DIAGNOSIS — M898X1 Other specified disorders of bone, shoulder: Secondary | ICD-10-CM | POA: Diagnosis not present

## 2022-04-01 DIAGNOSIS — E7800 Pure hypercholesterolemia, unspecified: Secondary | ICD-10-CM

## 2022-04-01 DIAGNOSIS — Z23 Encounter for immunization: Secondary | ICD-10-CM

## 2022-04-01 DIAGNOSIS — R7303 Prediabetes: Secondary | ICD-10-CM | POA: Diagnosis not present

## 2022-04-01 DIAGNOSIS — M7918 Myalgia, other site: Secondary | ICD-10-CM

## 2022-04-01 LAB — PSA: PSA: 1.42 ng/mL (ref 0.10–4.00)

## 2022-04-01 LAB — LIPID PANEL
Cholesterol: 158 mg/dL (ref 0–200)
HDL: 63.4 mg/dL (ref >39.00)
LDL Cholesterol: 76 mg/dL (ref 0–99)
NonHDL: 94.33
Total CHOL/HDL Ratio: 2
Triglycerides: 92 mg/dL (ref 0.0–149.0)
VLDL: 18.4 mg/dL (ref 0.0–40.0)

## 2022-04-01 LAB — COMPREHENSIVE METABOLIC PANEL
ALT: 33 U/L (ref 0–53)
AST: 26 U/L (ref 0–37)
Albumin: 5 g/dL (ref 3.5–5.2)
Alkaline Phosphatase: 38 U/L — ABNORMAL LOW (ref 39–117)
BUN: 20 mg/dL (ref 6–23)
CO2: 28 mEq/L (ref 19–32)
Calcium: 9.9 mg/dL (ref 8.4–10.5)
Chloride: 100 mEq/L (ref 96–112)
Creatinine, Ser: 1.39 mg/dL (ref 0.40–1.50)
GFR: 54.37 mL/min — ABNORMAL LOW (ref >60.00)
Glucose, Bld: 124 mg/dL — ABNORMAL HIGH (ref 70–99)
Potassium: 4.2 mEq/L (ref 3.5–5.1)
Sodium: 135 mEq/L (ref 135–145)
Total Bilirubin: 0.8 mg/dL (ref 0.2–1.2)
Total Protein: 7.6 g/dL (ref 6.0–8.3)

## 2022-04-01 LAB — HEMOGLOBIN A1C: Hgb A1c MFr Bld: 6.1 % (ref 4.6–6.5)

## 2022-04-01 LAB — CBC
HCT: 41.4 % (ref 39.0–52.0)
Hemoglobin: 14.7 g/dL (ref 13.0–17.0)
MCHC: 35.5 g/dL (ref 30.0–36.0)
MCV: 100.7 fl — ABNORMAL HIGH (ref 78.0–100.0)
Platelets: 213 10*3/uL (ref 150.0–400.0)
RBC: 4.12 Mil/uL — ABNORMAL LOW (ref 4.22–5.81)
RDW: 14.2 % (ref 11.5–15.5)
WBC: 4.6 10*3/uL (ref 4.0–10.5)

## 2022-04-01 MED ORDER — ATORVASTATIN CALCIUM 80 MG PO TABS: 80.0000 mg | ORAL_TABLET | Freq: Every day | ORAL | 3 refills | Status: DC

## 2022-04-01 MED ORDER — LISINOPRIL-HYDROCHLOROTHIAZIDE 20-12.5 MG PO TABS: ORAL_TABLET | ORAL | 3 refills | Status: DC

## 2022-04-01 MED ORDER — CLOPIDOGREL BISULFATE 75 MG PO TABS: 75.0000 mg | ORAL_TABLET | Freq: Every day | ORAL | 3 refills | Status: DC

## 2022-04-01 MED ORDER — AMLODIPINE BESYLATE 5 MG PO TABS: 5.0000 mg | ORAL_TABLET | Freq: Every day | ORAL | 1 refills | Status: DC

## 2022-04-01 NOTE — Progress Notes (Signed)
Office Note 04/01/2022  CC:  Chief Complaint  Patient presents with   Annual Exam    Pt is fasting   HPI:  Patient is a 61 y.o. male who is here for annual health maintenance exam and follow-up hypertension, hyperlipidemia, and prediabetes. A/P as of last visit: "1) HTN: Stable on lisinopril/HCTZ 20/12.5, 2 tabs per day as well as amlodipine 5 mg/day. Lytes/cr today.   2) HLD: Doing well on atorvastatin 80 mg a day. FLP and hepatic panel today.   3) Prediabetes: Working on lowering carb intake and increasing exercise. Hba1c and fasting glucose today.   4) bilateral shoulder pain.  Suspect a combination of osteoarthritis and rotator cuff tendinopathy.  He may start turmeric daily and I gave him some rotator cuff rehab exercises.   5) Health maintenance exam: Reviewed age and gender appropriate health maintenance issues (prudent diet, regular exercise, health risks of tobacco and excessive alcohol, use of seatbelts, fire alarms in home, use of sunscreen).  Also reviewed age and gender appropriate health screening as well as vaccine recommendations. Vaccines: Flu->given today.  Otherwise all utd. Labs: fasting HP, Hba1c, PSA. Prostate ca screening: PSA. Colon ca screening: he was due for recall 2020-->pt states he contacted them and they said he is not due until 10 yr recall."  INTERIM HX: Jonathan Graves has had progressively worsening pain in the right scapular area for the last 6 months or so.  This summer when he was working physically in the yard he recalls a an acute onset of pain when he was bent over.  Since then it has been getting a little worse and more constant and is consistently made worse/reproduced when he turns his torso certain ways.  Application of heat helps temporarily, as does lidocaine patch. Tylenol no help. There is no tingling or numbness or weakness of his shoulder or neck. The pain sometimes radiates around the back up into the upper chest area.  He has no  exertional chest pain, nausea, palpitations, shortness of breath, or dizziness.  He is retired.  He has picked up some new hobbies and feels like he is doing well in retirement. However, his wife has some chronic illnesses that have been stressful lately.  Past Medical History:  Diagnosis Date   Chronic renal insufficiency, stage 2 (mild) 2017   GFR 60s   Diverticulitis    Elevated transaminase level 2017   Mild: fatty liver on u/s 11/2015   Granuloma annulare 02/20/2008   Dr. Everlene Farrier   Hyperlipemia, mixed    TLC recommended 2017.  Statin started 07/2017.   Hypertension    Obesity, Class I, BMI 30-34.9    Prediabetes 2017   gluc 131.  04/2017 fasting gluc 119, A1c 6.0%.  A1c stable 01/2018 and 10/2018. a1c up to 6.2% 10/2019   TIA (transient ischemic attack) 07/2017   w/u unrevealing (CT head/MRI head/echo/carotid dopplers.  Plavix + ASA x 3 wks, then plavix alone for secondary prevention.  Doing well as of 12/2017 neuro f/u.    Past Surgical History:  Procedure Laterality Date   BICEPS TENDON REPAIR Right    2'16 -Dr. Amanda Pea   COLONOSCOPY  approx 2010   normal.  Recall 10 yrs.   HAMMER TOE SURGERY  2015   LAPAROSCOPIC SIGMOID COLECTOMY N/A 01/31/2015   For diverticulitis.  Procedure: LAPAROSCOPIC SIGMOID COLECTOMY SPLENIC FLEXURE MOBILIZATION;  Surgeon: Axel Filler, MD;  Location: [facility];  Service: General;  Laterality: N/A;   LIPOMA RESECTION  2009   Dr.  Holderness   MOUTH SURGERY     SMALL INTESTINE SURGERY  01/2015   Small bowel anastomosis developed after sigmoid colectomy --repair required.   TRANSTHORACIC ECHOCARDIOGRAM  07/20/2017   Mild LVH, EF 55-60%, NORMAL.   VASECTOMY      Family History  Problem Relation Age of Onset   Gastric cancer Mother    Breast cancer Mother    Hypertension Father    Diabetes Father    Heart disease Father    Stroke Father    Early death Maternal Grandmother        MVA   Early death Maternal Grandfather        MVA   CVA Paternal  Grandmother    Alzheimer's disease Paternal Grandfather    Colon cancer Neg Hx    Liver disease Neg Hx     Social History   Socioeconomic History   Marital status: Married    Spouse name: Not on file   Number of children: Not on file   Years of education: Not on file   Highest education level: Not on file  Occupational History   Not on file  Tobacco Use   Smoking status: Former    Packs/day: 0.25    Types: Cigarettes   Smokeless tobacco: Never  Vaping Use   Vaping Use: Never used  Substance and Sexual Activity   Alcohol use: Yes    Alcohol/week: 8.0 standard drinks of alcohol    Types: 2 Glasses of wine, 6 Cans of beer per week    Comment: daily   Drug use: No   Sexual activity: Not on file  Other Topics Concern   Not on file  Social History Narrative   Married, 3 daughters (one set of twin daughters), 1 son--all are grown and out of the house.   Educ: colleg grad--BS.   Occup: business Education officer, museum.   Tob: no signif regular smoking.   Alc: 2 drinks per week   Drugs: none   No hx of colon or prostate cancer.   Social Determinants of Health   Financial Resource Strain: Not on file  Food Insecurity: Not on file  Transportation Needs: Not on file  Physical Activity: Not on file  Stress: Not on file  Social Connections: Not on file  Intimate Partner Violence: Not on file    Outpatient Medications Prior to Visit  Medication Sig Dispense Refill   FIBER PO Take 1 tablet by mouth every morning.      vitamin B-12 (CYANOCOBALAMIN) 1000 MCG tablet Take 1,000 mcg by mouth every morning.      amLODipine (NORVASC) 5 MG tablet TAKE ONE TABLET BY MOUTH ONE TIME DAILY 90 tablet 1   atorvastatin (LIPITOR) 80 MG tablet Take 1 tablet (80 mg total) by mouth daily at 6 PM. 90 tablet 3   clopidogrel (PLAVIX) 75 MG tablet Take 1 tablet (75 mg total) by mouth daily. 90 tablet 3   lisinopril-hydrochlorothiazide (ZESTORETIC) 20-12.5 MG tablet TAKE 2 TABLETS BY MOUTH EVERY DAY  180 tablet 3   No facility-administered medications prior to visit.    No Known Allergies  ROS Review of Systems  Constitutional:  Negative for appetite change, chills, fatigue and fever.  HENT:  Negative for congestion, dental problem, ear pain and sore throat.   Eyes:  Negative for discharge, redness and visual disturbance.  Respiratory:  Negative for cough, chest tightness, shortness of breath and wheezing.   Cardiovascular:  Negative for chest pain, palpitations and leg swelling.  Gastrointestinal:  Negative for abdominal pain, blood in stool, diarrhea, nausea and vomiting.  Genitourinary:  Negative for difficulty urinating, dysuria, flank pain, frequency, hematuria and urgency.  Musculoskeletal:  Positive for back pain. Negative for arthralgias, joint swelling, myalgias and neck stiffness.  Skin:  Negative for pallor and rash.  Neurological:  Negative for dizziness, speech difficulty, weakness and headaches.  Hematological:  Negative for adenopathy. Does not bruise/bleed easily.  Psychiatric/Behavioral:  Negative for confusion and sleep disturbance. The patient is not nervous/anxious.     PE;    04/01/2022   10:23 AM 03/24/2021    8:08 AM 09/22/2020    9:04 AM  Vitals with BMI  Height 6\' 1"  6\' 1"  6\' 0"   Weight 233 lbs 10 oz 229 lbs 233 lbs 3 oz  BMI 30.83 30.22 31.62  Systolic 103 [id number]  Diastolic 55 73 60  Pulse 60 55 58    Gen: Alert, well appearing.  Patient is oriented to person, place, time, and situation. AFFECT: pleasant, lucid thought and speech. ENT: Ears: EACs clear, normal epithelium.  TMs with good light reflex and landmarks bilaterally.  Eyes: no injection, icteris, swelling, or exudate.  EOMI, PERRLA. Nose: no drainage or turbinate edema/swelling.  No injection or focal lesion.  Mouth: lips without lesion/swelling.  Oral mucosa pink and moist.  Dentition intact and without obvious caries or gingival swelling.  Oropharynx without erythema, exudate, or  swelling.  Neck: supple/nontender.  No LAD, mass, or TM.  Carotid pulses 2+ bilaterally, without bruits. CV: RRR, no m/r/g.   LUNGS: CTA bilat, nonlabored resps, good aeration in all lung Jonathan Graves. ABD: soft, NT, ND, BS normal.  No hepatospenomegaly or mass.  No bruits. EXT: no clubbing, cyanosis, or edema.  Musculoskeletal: no joint swelling, erythema, warmth, or tenderness.  ROM of all joints intact. Skin - no sores or suspicious lesions or rashes or color changes Back: Deep palpation over the right scapula alleviates his pain in that region. No sensitivity to light touch over the back.  No rash. No mass.  Pertinent labs:  Lab Results  Component Value Date   TSH 5.20 03/24/2021   Lab Results  Component Value Date   WBC 4.0 03/24/2021   HGB 13.4 03/24/2021   HCT 39.8 03/24/2021   MCV 102.5 (H) 03/24/2021   PLT 179.0 03/24/2021   Lab Results  Component Value Date   CREATININE 1.38 03/24/2021   BUN 22 03/24/2021   NA 136 03/24/2021   K 4.2 03/24/2021   CL 102 03/24/2021   CO2 26 03/24/2021   Lab Results  Component Value Date   ALT 20 03/24/2021   AST 19 03/24/2021   ALKPHOS 35 (L) 03/24/2021   BILITOT 0.8 03/24/2021   Lab Results  Component Value Date   CHOL 138 03/24/2021   Lab Results  Component Value Date   HDL 63.20 03/24/2021   Lab Results  Component Value Date   LDLCALC 52 03/24/2021   Lab Results  Component Value Date   TRIG 117.0 03/24/2021   Lab Results  Component Value Date   CHOLHDL 2 03/24/2021   Lab Results  Component Value Date   PSA 0.95 03/24/2021   PSA 0.75 11/02/2019   PSA 0.99 11/16/2018   Lab Results  Component Value Date   HGBA1C 5.9 03/24/2021   ASSESSMENT AND PLAN:   #1 health maintenance exam: Reviewed age and gender appropriate health maintenance issues (prudent diet, regular exercise, health risks of tobacco and excessive alcohol, use of  seatbelts, fire alarms in home, use of sunscreen).  Also reviewed age and gender  appropriate health screening as well as vaccine recommendations. Vaccines: All up-to-date  Labs: fasting HP, Hba1c, PSA. Prostate ca screening: PSA. Colon ca screening: he was due for recall 2020-->pt states he contacted them and they said he is not due until 10 yr recall.  #2 hypertension, well-controlled on amlodipine 5 mg a day and lisinopril-HCTZ 20-12 0.5, 2 tabs a day.  #3 hyperlipidemia, doing well on 80 mg atorvastatin daily. Lipid panel and hepatic panel today.  4.  Right mid back/scapula pain. Myofascial pain most likely.   Recommended PT, patient wants to wait until next year due to possible insurance issues.  Additionally, recommended thoracic spine and right scapula plain films. He will call back about these as well.  An After Visit Summary was printed and given to the patient.  FOLLOW UP:  Return in about 3 months (around 07/01/2022) for f/u back pain.  Signed:  Santiago Bumpers, MD           04/01/2022

## 2022-04-02 ENCOUNTER — Encounter: Payer: Self-pay | Admitting: Family Medicine

## 2022-04-02 DIAGNOSIS — Z23 Encounter for immunization: Secondary | ICD-10-CM

## 2022-04-02 DIAGNOSIS — Z0000 Encounter for general adult medical examination without abnormal findings: Secondary | ICD-10-CM | POA: Diagnosis not present

## 2022-04-15 ENCOUNTER — Encounter: Payer: Self-pay | Admitting: Family Medicine

## 2022-05-25 DIAGNOSIS — M546 Pain in thoracic spine: Secondary | ICD-10-CM | POA: Diagnosis not present

## 2022-05-28 DIAGNOSIS — M546 Pain in thoracic spine: Secondary | ICD-10-CM | POA: Diagnosis not present

## 2022-10-22 ENCOUNTER — Other Ambulatory Visit: Payer: Self-pay | Admitting: Family Medicine

## 2022-11-16 ENCOUNTER — Other Ambulatory Visit: Payer: Self-pay | Admitting: Family Medicine

## 2022-11-20 ENCOUNTER — Other Ambulatory Visit: Payer: Self-pay | Admitting: Family Medicine

## 2022-12-22 ENCOUNTER — Telehealth: Payer: Self-pay

## 2022-12-22 ENCOUNTER — Other Ambulatory Visit: Payer: Self-pay

## 2022-12-22 MED ORDER — AMLODIPINE BESYLATE 5 MG PO TABS: 5.0000 mg | ORAL_TABLET | Freq: Every day | ORAL | 0 refills | Status: DC

## 2022-12-22 NOTE — Telephone Encounter (Signed)
Spoke with patient regarding results/recommendations.  

## 2022-12-22 NOTE — Telephone Encounter (Signed)
Patient is very upset.  He states that Dr. Milinda Cave has always refilled his prescriptions for 1 year. [id number] and "now all of a sudden, I can't get my medications, unless I come in, that makes no sense to me. Have someone to ask him  "

## 2023-03-13 ENCOUNTER — Other Ambulatory Visit: Payer: Self-pay | Admitting: Family Medicine

## 2023-03-14 NOTE — Telephone Encounter (Signed)
Pt has appt tomorrow 11/26.

## 2023-03-15 ENCOUNTER — Encounter: Payer: Self-pay | Admitting: Family Medicine

## 2023-03-15 ENCOUNTER — Ambulatory Visit (INDEPENDENT_AMBULATORY_CARE_PROVIDER_SITE_OTHER): Payer: 59 | Admitting: Family Medicine

## 2023-03-15 VITALS — BP 126/79 | HR 53 | Ht 73.0 in | Wt 234.4 lb

## 2023-03-15 DIAGNOSIS — R7303 Prediabetes: Secondary | ICD-10-CM | POA: Diagnosis not present

## 2023-03-15 DIAGNOSIS — I10 Essential (primary) hypertension: Secondary | ICD-10-CM

## 2023-03-15 DIAGNOSIS — N5201 Erectile dysfunction due to arterial insufficiency: Secondary | ICD-10-CM | POA: Diagnosis not present

## 2023-03-15 DIAGNOSIS — Z0000 Encounter for general adult medical examination without abnormal findings: Secondary | ICD-10-CM | POA: Diagnosis not present

## 2023-03-15 DIAGNOSIS — E782 Mixed hyperlipidemia: Secondary | ICD-10-CM

## 2023-03-15 DIAGNOSIS — Z23 Encounter for immunization: Secondary | ICD-10-CM

## 2023-03-15 DIAGNOSIS — Z125 Encounter for screening for malignant neoplasm of prostate: Secondary | ICD-10-CM

## 2023-03-15 MED ORDER — TADALAFIL 5 MG PO TABS: 5.0000 mg | ORAL_TABLET | Freq: Every day | ORAL | 5 refills | Status: DC

## 2023-03-15 MED ORDER — ATORVASTATIN CALCIUM 80 MG PO TABS: 80.0000 mg | ORAL_TABLET | Freq: Every day | ORAL | 3 refills | Status: DC

## 2023-03-15 MED ORDER — LISINOPRIL-HYDROCHLOROTHIAZIDE 20-12.5 MG PO TABS: ORAL_TABLET | ORAL | 3 refills | Status: DC

## 2023-03-15 MED ORDER — CLOPIDOGREL BISULFATE 75 MG PO TABS: 75.0000 mg | ORAL_TABLET | Freq: Every day | ORAL | 3 refills | Status: DC

## 2023-03-15 NOTE — Patient Instructions (Signed)
Health Maintenance, Male Adopting a healthy lifestyle and getting preventive care are important in promoting health and wellness. Ask your health care provider about: The right schedule for you to have regular tests and exams. Things you can do on your own to prevent diseases and keep yourself healthy. What should I know about diet, weight, and exercise? Eat a healthy diet  Eat a diet that includes plenty of vegetables, fruits, low-fat dairy products, and lean protein. Do not eat a lot of foods that are high in solid fats, added sugars, or sodium. Maintain a healthy weight Body mass index (BMI) is a measurement that can be used to identify possible weight problems. It estimates body fat based on height and weight. Your health care provider can help determine your BMI and help you achieve or maintain a healthy weight. Get regular exercise Get regular exercise. This is one of the most important things you can do for your health. Most adults should: Exercise for at least 150 minutes each week. The exercise should increase your heart rate and make you sweat (moderate-intensity exercise). Do strengthening exercises at least twice a week. This is in addition to the moderate-intensity exercise. Spend less time sitting. Even light physical activity can be beneficial. Watch cholesterol and blood lipids Have your blood tested for lipids and cholesterol at 63 years of age, then have this test every 5 years. You may need to have your cholesterol levels checked more often if: Your lipid or cholesterol levels are high. You are older than 63 years of age. You are at high risk for heart disease. What should I know about cancer screening? Many types of cancers can be detected early and may often be prevented. Depending on your health history and family history, you may need to have cancer screening at various ages. This may include screening for: Colorectal cancer. Prostate cancer. Skin cancer. Lung  cancer. What should I know about heart disease, diabetes, and high blood pressure? Blood pressure and heart disease High blood pressure causes heart disease and increases the risk of stroke. This is more likely to develop in people who have high blood pressure readings or are overweight. Talk with your health care provider about your target blood pressure readings. Have your blood pressure checked: Every 3-5 years if you are 18-39 years of age. Every year if you are 40 years old or older. If you are between the ages of 65 and 75 and are a current or former smoker, ask your health care provider if you should have a one-time screening for abdominal aortic aneurysm (AAA). Diabetes Have regular diabetes screenings. This checks your fasting blood sugar level. Have the screening done: Once every three years after age 45 if you are at a normal weight and have a low risk for diabetes. More often and at a younger age if you are overweight or have a high risk for diabetes. What should I know about preventing infection? Hepatitis B If you have a higher risk for hepatitis B, you should be screened for this virus. Talk with your health care provider to find out if you are at risk for hepatitis B infection. Hepatitis C Blood testing is recommended for: Everyone born from 1945 through 1965. Anyone with known risk factors for hepatitis C. Sexually transmitted infections (STIs) You should be screened each year for STIs, including gonorrhea and chlamydia, if: You are sexually active and are younger than 63 years of age. You are older than 63 years of age and your   health care provider tells you that you are at risk for this type of infection. Your sexual activity has changed since you were last screened, and you are at increased risk for chlamydia or gonorrhea. Ask your health care provider if you are at risk. Ask your health care provider about whether you are at high risk for HIV. Your health care provider  may recommend a prescription medicine to help prevent HIV infection. If you choose to take medicine to prevent HIV, you should first get tested for HIV. You should then be tested every 3 months for as long as you are taking the medicine. Follow these instructions at home: Alcohol use Do not drink alcohol if your health care provider tells you not to drink. If you drink alcohol: Limit how much you have to 0-2 drinks a day. Know how much alcohol is in your drink. In the [place] one drink equals one 12 oz bottle of beer (355 mL), one 5 oz glass of wine (148 mL), or one 1 oz glass of hard liquor (44 mL). Lifestyle Do not use any products that contain nicotine or tobacco. These products include cigarettes, chewing tobacco, and vaping devices, such as e-cigarettes. If you need help quitting, ask your health care provider. Do not use street drugs. Do not share needles. Ask your health care provider for help if you need support or information about quitting drugs. General instructions Schedule regular health, dental, and eye exams. Stay current with your vaccines. Tell your health care provider if: You often feel depressed. You have ever been abused or do not feel safe at home. Summary Adopting a healthy lifestyle and getting preventive care are important in promoting health and wellness. Follow your health care provider's instructions about healthy diet, exercising, and getting tested or screened for diseases. Follow your health care provider's instructions on monitoring your cholesterol and blood pressure. This information is not intended to replace advice given to you by your health care provider. Make sure you discuss any questions you have with your health care provider. Document Revised: 08/25/2020 Document Reviewed: 08/25/2020 [employer] Patient Education  2024 [employer]  

## 2023-03-15 NOTE — Progress Notes (Signed)
Office Note 03/15/2023  CC:  Chief Complaint  Patient presents with   Annual Exam    Pt is fasting.     HPI:  Patient is a 62 y.o. male who is here for annual health maintenance exam and follow-up hypertension, hyperlipidemia, and prediabetes.  Dealing well. Home blood pressures consistently 120s over 70s, occasionally a bit lower. He notes that this is primarily when he is dehydrated.  Has had long-term problems getting an erection.  Libido is intact.  He says it is affecting his quality of life and he is willing to accept risks of ED medication such as interaction with his antihypertensives.  Past Medical History:  Diagnosis Date   Chronic renal insufficiency, stage 2 (mild) 2017   GFR 60s   Diverticulitis    Elevated transaminase level 2017   Mild: fatty liver on u/s 11/2015   Granuloma annulare 02/20/2008   Dr. Everlene Farrier   Hyperlipemia, mixed    TLC recommended 2017.  Statin started 07/2017.   Hypertension    Obesity, Class I, BMI 30-34.9    Prediabetes 2017   gluc 131.  04/2017 fasting gluc 119, A1c 6.0%.  A1c stable 01/2018 and 10/2018. a1c up to 6.2% 10/2019 Dec 2023 6.1%.   TIA (transient ischemic attack) 07/2017   w/u unrevealing (CT head/MRI head/echo/carotid dopplers.  Plavix + ASA x 3 wks, then plavix alone for secondary prevention.  Doing well as of 12/2017 neuro f/u.    Past Surgical History:  Procedure Laterality Date   BICEPS TENDON REPAIR Right    2'16 -Dr. Amanda Pea   COLONOSCOPY  approx 2010   normal.  Recall 10 yrs.   HAMMER TOE SURGERY  2015   LAPAROSCOPIC SIGMOID COLECTOMY N/A 01/31/2015   For diverticulitis.  Procedure: LAPAROSCOPIC SIGMOID COLECTOMY SPLENIC FLEXURE MOBILIZATION;  Surgeon: Axel Filler, MD;  Location: [facility];  Service: General;  Laterality: N/A;   LIPOMA RESECTION  2009   Dr. Stephens November   MOUTH SURGERY     SMALL INTESTINE SURGERY  01/2015   Small bowel anastomosis developed after sigmoid colectomy --repair required.    TRANSTHORACIC ECHOCARDIOGRAM  07/20/2017   Mild LVH, EF 55-60%, NORMAL.   VASECTOMY      Family History  Problem Relation Age of Onset   Gastric cancer Mother    Breast cancer Mother    Hypertension Father    Diabetes Father    Heart disease Father    Stroke Father    Early death Maternal Grandmother        MVA   Early death Maternal Grandfather        MVA   CVA Paternal Grandmother    Alzheimer's disease Paternal Grandfather    Colon cancer Neg Hx    Liver disease Neg Hx     Social History   Socioeconomic History   Marital status: Married    Spouse name: Not on file   Number of children: Not on file   Years of education: Not on file   Highest education level: Not on file  Occupational History   Not on file  Tobacco Use   Smoking status: Former    Current packs/day: 0.25    Types: Cigarettes   Smokeless tobacco: Never  Vaping Use   Vaping status: Never Used  Substance and Sexual Activity   Alcohol use: Yes    Alcohol/week: 8.0 standard drinks of alcohol    Types: 2 Glasses of wine, 6 Cans of beer per week    Comment:  daily   Drug use: No   Sexual activity: Not on file  Other Topics Concern   Not on file  Social History Narrative   Married, 3 daughters (one set of twin daughters), 1 son--all are grown and out of the house.   Educ: colleg grad--BS.   Occup: business Community education officer.   Tob: no signif regular smoking.   Alc: 2 drinks per week   Drugs: none   No hx of colon or prostate cancer.   Social Determinants of Health   Financial Resource Strain: Not on file  Food Insecurity: Not on file  Transportation Needs: Not on file  Physical Activity: Not on file  Stress: Not on file  Social Connections: Not on file  Intimate Partner Violence: Not on file    Outpatient Medications Prior to Visit  Medication Sig Dispense Refill   amLODipine (NORVASC) 5 MG tablet Take 1 tablet (5 mg total) by mouth daily. 90 tablet 0   FIBER PO Take 1 tablet by mouth  every morning.      vitamin B-12 (CYANOCOBALAMIN) 1000 MCG tablet Take 1,000 mcg by mouth every morning.      atorvastatin (LIPITOR) 80 MG tablet Take 1 tablet (80 mg total) by mouth daily at 6 PM. 90 tablet 3   clopidogrel (PLAVIX) 75 MG tablet Take 1 tablet (75 mg total) by mouth daily. 90 tablet 3   lisinopril-hydrochlorothiazide (ZESTORETIC) 20-12.5 MG tablet TAKE 2 TABLETS BY MOUTH EVERY DAY 180 tablet 3   No facility-administered medications prior to visit.    No Known Allergies  Review of Systems  Constitutional:  Negative for appetite change, chills, fatigue and fever.  HENT:  Negative for congestion, dental problem, ear pain and sore throat.   Eyes:  Negative for discharge, redness and visual disturbance.  Respiratory:  Negative for cough, chest tightness, shortness of breath and wheezing.   Cardiovascular:  Negative for chest pain, palpitations and leg swelling.  Gastrointestinal:  Negative for abdominal pain, blood in stool, diarrhea, nausea and vomiting.  Genitourinary:  Negative for difficulty urinating, dysuria, flank pain, frequency, hematuria and urgency.  Musculoskeletal:  Negative for arthralgias, back pain, joint swelling, myalgias and neck stiffness.  Skin:  Negative for pallor and rash.  Neurological:  Positive for light-headedness (upon standing, brief and mild, chronic). Negative for dizziness, speech difficulty, weakness and headaches.  Hematological:  Negative for adenopathy. Does not bruise/bleed easily.  Psychiatric/Behavioral:  Negative for confusion and sleep disturbance. The patient is not nervous/anxious.     PE;    03/15/2023    8:03 AM 04/01/2022   10:23 AM 03/24/2021    8:08 AM  Vitals with BMI  Height 6\' 1"  6\' 1"  6\' 1"   Weight 234 lbs 6 oz 233 lbs 10 oz 229 lbs  BMI 30.93 30.83 30.22  Systolic 126 103 161  Diastolic 79 55 73  Pulse 53 60 55    Gen: Alert, well appearing.  Patient is oriented to person, place, time, and situation. AFFECT:  pleasant, lucid thought and speech. ENT: Ears: EACs clear, normal epithelium.  TMs with good light reflex and landmarks bilaterally.  Eyes: no injection, icteris, swelling, or exudate.  EOMI, PERRLA. Nose: no drainage or turbinate edema/swelling.  No injection or focal lesion.  Mouth: lips without lesion/swelling.  Oral mucosa pink and moist.  Dentition intact and without obvious caries or gingival swelling.  Oropharynx without erythema, exudate, or swelling.  Neck: supple/nontender.  No LAD, mass, or TM.  Carotid pulses 2+  bilaterally, without bruits. CV: RRR, no m/r/g.   LUNGS: CTA bilat, nonlabored resps, good aeration in all lung fields. ABD: soft, NT, ND, BS normal.  No hepatospenomegaly or mass.  No bruits. EXT: no clubbing, cyanosis, or edema.  Musculoskeletal: no joint swelling, erythema, warmth, or tenderness.  ROM of all joints intact. Skin - no sores or suspicious lesions or rashes or color changes  Pertinent labs:  Lab Results  Component Value Date   TSH 5.20 03/24/2021   Lab Results  Component Value Date   WBC 4.6 04/01/2022   HGB 14.7 04/01/2022   HCT 41.4 04/01/2022   MCV 100.7 (H) 04/01/2022   PLT 213.0 04/01/2022   Lab Results  Component Value Date   CREATININE 1.39 04/01/2022   BUN 20 04/01/2022   NA 135 04/01/2022   K 4.2 04/01/2022   CL 100 04/01/2022   CO2 28 04/01/2022   Lab Results  Component Value Date   ALT 33 04/01/2022   AST 26 04/01/2022   ALKPHOS 38 (L) 04/01/2022   BILITOT 0.8 04/01/2022   Lab Results  Component Value Date   CHOL 158 04/01/2022   Lab Results  Component Value Date   HDL 63.40 04/01/2022   Lab Results  Component Value Date   LDLCALC 76 04/01/2022   Lab Results  Component Value Date   TRIG 92.0 04/01/2022   Lab Results  Component Value Date   CHOLHDL 2 04/01/2022   Lab Results  Component Value Date   PSA 1.42 04/01/2022   PSA 0.95 03/24/2021   PSA 0.75 11/02/2019   Lab Results  Component Value Date    HGBA1C 6.1 04/01/2022   ASSESSMENT AND PLAN:   No problem-specific Assessment & Plan notes found for this encounter.  #1 health maintenance exam: Reviewed age and gender appropriate health maintenance issues (prudent diet, regular exercise, health risks of tobacco and excessive alcohol, use of seatbelts, fire alarms in home, use of sunscreen).  Also reviewed age and gender appropriate health screening as well as vaccine recommendations. Vaccines: Flu given today.  Otherwise all up-to-date  Labs: fasting HP, Hba1c (prediabetes), PSA. Prostate ca screening: PSA. Colon ca screening: recall 2025  #2 hypertension, well-controlled on amlodipine 5 mg a day and lisinopril/HCTZ 20/12.5, 2 tabs daily. Electrolytes and creatinine today.  3. hypercholesterolemia, doing well on a atorvastatin 80 mg a day. Lipid panel and hepatic panel today.  4.  Erectile dysfunction. Will do trial of Cialis 5 mg daily.  An After Visit Summary was printed and given to the patient.  FOLLOW UP:  Return for routine chronic illness f/u.  Signed:  Santiago Bumpers, MD           03/15/2023

## 2023-03-16 LAB — COMPREHENSIVE METABOLIC PANEL
AG Ratio: 1.9 (calc) (ref 1.0–2.5)
ALT: 28 U/L (ref 9–46)
AST: 24 U/L (ref 10–35)
Albumin: 4.6 g/dL (ref 3.6–5.1)
Alkaline phosphatase (APISO): 38 U/L (ref 35–144)
BUN/Creatinine Ratio: 14 (calc) (ref 6–22)
BUN: 25 mg/dL (ref 7–25)
CO2: 27 mmol/L (ref 20–32)
Calcium: 9.5 mg/dL (ref 8.6–10.3)
Chloride: 103 mmol/L (ref 98–110)
Creat: 1.82 mg/dL — ABNORMAL HIGH (ref 0.70–1.35)
Globulin: 2.4 g/dL (ref 1.9–3.7)
Glucose, Bld: 131 mg/dL — ABNORMAL HIGH (ref 65–99)
Potassium: 4.3 mmol/L (ref 3.5–5.3)
Sodium: 139 mmol/L (ref 135–146)
Total Bilirubin: 0.9 mg/dL (ref 0.2–1.2)
Total Protein: 7 g/dL (ref 6.1–8.1)

## 2023-03-16 LAB — CBC
HCT: 42 % (ref 38.5–50.0)
Hemoglobin: 14.3 g/dL (ref 13.2–17.1)
MCH: 34.6 pg — ABNORMAL HIGH (ref 27.0–33.0)
MCHC: 34 g/dL (ref 32.0–36.0)
MCV: 101.7 fL — ABNORMAL HIGH (ref 80.0–100.0)
MPV: 10 fL (ref 7.5–12.5)
Platelets: 174 10*3/uL (ref 140–400)
RBC: 4.13 10*6/uL — ABNORMAL LOW (ref 4.20–5.80)
RDW: 13 % (ref 11.0–15.0)
WBC: 5.7 10*3/uL (ref 3.8–10.8)

## 2023-03-16 LAB — LIPID PANEL
Cholesterol: 152 mg/dL (ref <200)
HDL: 60 mg/dL (ref >=40)
LDL Cholesterol (Calc): 73 mg/dL
Non-HDL Cholesterol (Calc): 92 mg/dL (ref <130)
Total CHOL/HDL Ratio: 2.5 (calc) (ref <5.0)
Triglycerides: 104 mg/dL (ref <150)

## 2023-03-16 LAB — HEMOGLOBIN A1C
Hgb A1c MFr Bld: 6 %{Hb} — ABNORMAL HIGH (ref <5.7)
Mean Plasma Glucose: 126 mg/dL
eAG (mmol/L): 7 mmol/L

## 2023-03-16 LAB — PSA: PSA: 1 ng/mL (ref <=4.00)

## 2023-03-25 NOTE — Telephone Encounter (Signed)
Patient is out of meds. Should have [facility]  [facility]   Prescription Request  03/25/2023  LOV: 04/01/2022  What is the name of the medication or equipment? amLODipine (NORVASC) 5 MG tablet   Have you contacted your pharmacy to request a refill? Yes   Which pharmacy would you like this sent to?  [id number] [place], [place] - [address] [place]  [address] Phone: [phone] Fax: [phone]    Patient notified that their request is being sent to the clinical staff for review and that they should receive a response within 2 business days.   Please advise at Mobile [phone] (mobile)

## 2023-03-30 ENCOUNTER — Other Ambulatory Visit (INDEPENDENT_AMBULATORY_CARE_PROVIDER_SITE_OTHER): Payer: 59

## 2023-03-30 DIAGNOSIS — R7989 Other specified abnormal findings of blood chemistry: Secondary | ICD-10-CM | POA: Diagnosis not present

## 2023-03-30 LAB — BASIC METABOLIC PANEL
BUN: 17 mg/dL (ref 6–23)
CO2: 25 meq/L (ref 19–32)
Calcium: 9.7 mg/dL (ref 8.4–10.5)
Chloride: 102 meq/L (ref 96–112)
Creatinine, Ser: 1.59 mg/dL — ABNORMAL HIGH (ref 0.40–1.50)
GFR: 45.95 mL/min — ABNORMAL LOW (ref >60.00)
Glucose, Bld: 136 mg/dL — ABNORMAL HIGH (ref 70–99)
Potassium: 3.9 meq/L (ref 3.5–5.1)
Sodium: 139 meq/L (ref 135–145)

## 2023-03-30 MED ORDER — AMLODIPINE BESYLATE 5 MG PO TABS: 5.0000 mg | ORAL_TABLET | Freq: Every day | ORAL | 3 refills | Status: DC

## 2023-03-30 MED ORDER — AMLODIPINE BESYLATE 5 MG PO TABS: 5.0000 mg | ORAL_TABLET | Freq: Every day | ORAL | 0 refills | Status: DC

## 2023-03-31 ENCOUNTER — Other Ambulatory Visit: Payer: Self-pay

## 2023-03-31 DIAGNOSIS — N183 Chronic kidney disease, stage 3 unspecified: Secondary | ICD-10-CM

## 2023-04-01 NOTE — Progress Notes (Signed)
noted 

## 2023-04-05 ENCOUNTER — Telehealth: Payer: Self-pay | Admitting: Pharmacy Technician

## 2023-04-05 NOTE — Telephone Encounter (Signed)
Pharmacy Patient Advocate Encounter  Received notification from [facility] that Prior Authorization for TADALAFIL 5MG  has been DENIED.  See denial reason below. No denial letter attached in CMM. Will attach denial letter to Media tab once received.   PA #/Case ID/Reference #: [id number]

## 2023-04-06 NOTE — Telephone Encounter (Signed)
Pls call patient and make sure he's aware of all the insurance coverage issues for the tadalafil. The only other thing I know for him to do is call the number on the back of his insurance card and ask them what erectile dysfunction medication is on their preferred list.  I'll be glad to send in a prescription for whatever medication they tell him. thx

## 2023-04-06 NOTE — Telephone Encounter (Signed)
Pt has been made aware. Pt stated he would let us know what medication they cover. No further action needed at this time.

## 2023-05-01 ENCOUNTER — Encounter: Payer: Self-pay | Admitting: Family Medicine

## 2023-05-01 DIAGNOSIS — N183 Chronic kidney disease, stage 3 unspecified: Secondary | ICD-10-CM

## 2023-05-02 ENCOUNTER — Other Ambulatory Visit: Payer: 59

## 2023-05-03 NOTE — Telephone Encounter (Signed)
 please order renal ultrasound at the imaging location of his choice, diagnosis chronic renal insufficiency stage III. Also order basic metabolic panel and urinalysis future, same diagnosis. Thanks.

## 2023-05-06 ENCOUNTER — Other Ambulatory Visit (INDEPENDENT_AMBULATORY_CARE_PROVIDER_SITE_OTHER): Payer: 59

## 2023-05-06 DIAGNOSIS — N183 Chronic kidney disease, stage 3 unspecified: Secondary | ICD-10-CM

## 2023-05-06 LAB — BASIC METABOLIC PANEL WITH GFR
BUN: 20 mg/dL (ref 6–23)
CO2: 29 meq/L (ref 19–32)
Calcium: 9.4 mg/dL (ref 8.4–10.5)
Chloride: 102 meq/L (ref 96–112)
Creatinine, Ser: 1.31 mg/dL (ref 0.40–1.50)
GFR: 57.93 mL/min — ABNORMAL LOW
Glucose, Bld: 118 mg/dL — ABNORMAL HIGH (ref 70–99)
Potassium: 4.1 meq/L (ref 3.5–5.1)
Sodium: 139 meq/L (ref 135–145)

## 2023-05-06 LAB — URINALYSIS, ROUTINE W REFLEX MICROSCOPIC
Bilirubin Urine: NEGATIVE
Hgb urine dipstick: NEGATIVE
Ketones, ur: NEGATIVE
Leukocytes,Ua: NEGATIVE
Nitrite: NEGATIVE
Specific Gravity, Urine: 1.025 (ref 1.000–1.030)
Total Protein, Urine: NEGATIVE
Urine Glucose: NEGATIVE
Urobilinogen, UA: 0.2 (ref 0.0–1.0)
pH: 6 (ref 5.0–8.0)

## 2023-05-09 ENCOUNTER — Encounter: Payer: Self-pay | Admitting: Family Medicine

## 2023-05-12 ENCOUNTER — Ambulatory Visit (HOSPITAL_BASED_OUTPATIENT_CLINIC_OR_DEPARTMENT_OTHER)
Admission: RE | Admit: 2023-05-12 | Discharge: 2023-05-12 | Disposition: A | Discharge status: Home / Self Care | Payer: 59 | Source: Ambulatory Visit | Attending: Family Medicine | Admitting: Family Medicine

## 2023-05-12 DIAGNOSIS — N189 Chronic kidney disease, unspecified: Secondary | ICD-10-CM | POA: Diagnosis not present

## 2023-05-12 DIAGNOSIS — N183 Chronic kidney disease, stage 3 unspecified: Secondary | ICD-10-CM | POA: Insufficient documentation

## 2023-05-12 DIAGNOSIS — N1330 Unspecified hydronephrosis: Secondary | ICD-10-CM | POA: Diagnosis not present

## 2023-05-13 ENCOUNTER — Encounter: Payer: Self-pay | Admitting: Family Medicine

## 2023-05-15 NOTE — Telephone Encounter (Signed)
Yes okay

## 2023-05-17 MED ORDER — TADALAFIL 5 MG PO TABS: 5.0000 mg | ORAL_TABLET | Freq: Every day | ORAL | 5 refills | Status: AC

## 2023-05-17 NOTE — Telephone Encounter (Signed)
Rx printed and placed in PCP office to sign.

## 2023-05-17 NOTE — Telephone Encounter (Signed)
Note placed up front for pt pick up. Pt made aware.

## 2023-06-01 ENCOUNTER — Encounter: Payer: Self-pay | Admitting: Family Medicine

## 2023-06-02 NOTE — Telephone Encounter (Signed)
I cannot remove this. Needs to see general surgeon.  If needs referral pls order ([facility] [place] [facility]), dx left leg mass.

## 2024-01-05 DIAGNOSIS — Z1211 Encounter for screening for malignant neoplasm of colon: Secondary | ICD-10-CM | POA: Diagnosis not present

## 2024-01-05 DIAGNOSIS — Z8673 Personal history of transient ischemic attack (TIA), and cerebral infarction without residual deficits: Secondary | ICD-10-CM | POA: Diagnosis not present

## 2024-01-05 DIAGNOSIS — Z9049 Acquired absence of other specified parts of digestive tract: Secondary | ICD-10-CM | POA: Diagnosis not present

## 2024-02-17 DIAGNOSIS — Z980 Intestinal bypass and anastomosis status: Secondary | ICD-10-CM | POA: Diagnosis not present

## 2024-02-17 DIAGNOSIS — Z1211 Encounter for screening for malignant neoplasm of colon: Secondary | ICD-10-CM | POA: Diagnosis not present

## 2024-02-17 LAB — HM COLONOSCOPY

## 2024-03-17 ENCOUNTER — Other Ambulatory Visit: Payer: Self-pay | Admitting: Family Medicine

## 2024-03-19 ENCOUNTER — Encounter: Payer: Self-pay | Admitting: Family Medicine

## 2024-03-19 ENCOUNTER — Ambulatory Visit: Payer: 59 | Admitting: Family Medicine

## 2024-03-19 VITALS — BP 122/78 | HR 73 | Temp 97.2°F | Ht 73.0 in | Wt 226.8 lb

## 2024-03-19 DIAGNOSIS — Z23 Encounter for immunization: Secondary | ICD-10-CM | POA: Diagnosis not present

## 2024-03-19 DIAGNOSIS — Z125 Encounter for screening for malignant neoplasm of prostate: Secondary | ICD-10-CM

## 2024-03-19 DIAGNOSIS — E7800 Pure hypercholesterolemia, unspecified: Secondary | ICD-10-CM | POA: Diagnosis not present

## 2024-03-19 DIAGNOSIS — R7303 Prediabetes: Secondary | ICD-10-CM

## 2024-03-19 DIAGNOSIS — I10 Essential (primary) hypertension: Secondary | ICD-10-CM

## 2024-03-19 DIAGNOSIS — G8929 Other chronic pain: Secondary | ICD-10-CM | POA: Diagnosis not present

## 2024-03-19 DIAGNOSIS — Z0000 Encounter for general adult medical examination without abnormal findings: Secondary | ICD-10-CM | POA: Diagnosis not present

## 2024-03-19 DIAGNOSIS — M25531 Pain in right wrist: Secondary | ICD-10-CM | POA: Diagnosis not present

## 2024-03-19 MED ORDER — AMLODIPINE BESYLATE 5 MG PO TABS: 5.0000 mg | ORAL_TABLET | Freq: Every day | ORAL | 3 refills | Status: AC

## 2024-03-19 MED ORDER — CLOPIDOGREL BISULFATE 75 MG PO TABS: 75.0000 mg | ORAL_TABLET | Freq: Every day | ORAL | 3 refills | Status: AC

## 2024-03-19 MED ORDER — ATORVASTATIN CALCIUM 80 MG PO TABS: 80.0000 mg | ORAL_TABLET | Freq: Every day | ORAL | 3 refills | Status: AC

## 2024-03-19 MED ORDER — LISINOPRIL-HYDROCHLOROTHIAZIDE 20-12.5 MG PO TABS: 1.0000 | ORAL_TABLET | Freq: Every day | ORAL | 1 refills | Status: AC

## 2024-03-19 MED ORDER — LISINOPRIL-HYDROCHLOROTHIAZIDE 20-12.5 MG PO TABS: 1.0000 | ORAL_TABLET | Freq: Every day | ORAL | Status: DC

## 2024-03-19 NOTE — Patient Instructions (Signed)
 Health Maintenance, Male  Adopting a healthy lifestyle and getting preventive care are important in promoting health and wellness. Ask your health care provider about:  The right schedule for you to have regular tests and exams.  Things you can do on your own to prevent diseases and keep yourself healthy.  What should I know about diet, weight, and exercise?  Eat a healthy diet    Eat a diet that includes plenty of vegetables, fruits, low-fat dairy products, and lean protein.  Do not eat a lot of foods that are high in solid fats, added sugars, or sodium.  Maintain a healthy weight  Body mass index (BMI) is a measurement that can be used to identify possible weight problems. It estimates body fat based on height and weight. Your health care provider can help determine your BMI and help you achieve or maintain a healthy weight.  Get regular exercise  Get regular exercise. This is one of the most important things you can do for your health. Most adults should:  Exercise for at least 150 minutes each week. The exercise should increase your heart rate and make you sweat (moderate-intensity exercise).  Do strengthening exercises at least twice a week. This is in addition to the moderate-intensity exercise.  Spend less time sitting. Even light physical activity can be beneficial.  Watch cholesterol and blood lipids  Have your blood tested for lipids and cholesterol at 64 years of age, then have this test every 5 years.  You may need to have your cholesterol levels checked more often if:  Your lipid or cholesterol levels are high.  You are older than 64 years of age.  You are at high risk for heart disease.  What should I know about cancer screening?  Many types of cancers can be detected early and may often be prevented. Depending on your health history and family history, you may need to have cancer screening at various ages. This may include screening for:  Colorectal cancer.  Prostate cancer.  Skin cancer.  Lung  cancer.  What should I know about heart disease, diabetes, and high blood pressure?  Blood pressure and heart disease  High blood pressure causes heart disease and increases the risk of stroke. This is more likely to develop in people who have high blood pressure readings or are overweight.  Talk with your health care provider about your target blood pressure readings.  Have your blood pressure checked:  Every 3-5 years if you are 24-52 years of age.  Every year if you are 3 years old or older.  If you are between the ages of 60 and 72 and are a current or former smoker, ask your health care provider if you should have a one-time screening for abdominal aortic aneurysm (AAA).  Diabetes  Have regular diabetes screenings. This checks your fasting blood sugar level. Have the screening done:  Once every three years after age 66 if you are at a normal weight and have a low risk for diabetes.  More often and at a younger age if you are overweight or have a high risk for diabetes.  What should I know about preventing infection?  Hepatitis B  If you have a higher risk for hepatitis B, you should be screened for this virus. Talk with your health care provider to find out if you are at risk for hepatitis B infection.  Hepatitis C  Blood testing is recommended for:  Everyone born from [birth year] through 1966.  Anyone  with known risk factors for hepatitis C.  Sexually transmitted infections (STIs)  You should be screened each year for STIs, including gonorrhea and chlamydia, if:  You are sexually active and are younger than 64 years of age.  You are older than 64 years of age and your health care provider tells you that you are at risk for this type of infection.  Your sexual activity has changed since you were last screened, and you are at increased risk for chlamydia or gonorrhea. Ask your health care provider if you are at risk.  Ask your health care provider about whether you are at high risk for HIV. Your health care provider  may recommend a prescription medicine to help prevent HIV infection. If you choose to take medicine to prevent HIV, you should first get tested for HIV. You should then be tested every 3 months for as long as you are taking the medicine.  Follow these instructions at home:  Alcohol use  Do not drink alcohol if your health care provider tells you not to drink.  If you drink alcohol:  Limit how much you have to 0-2 drinks a day.  Know how much alcohol is in your drink. In the [place] one drink equals one 12 oz bottle of beer (355 mL), one 5 oz glass of wine (148 mL), or one 1 oz glass of hard liquor (44 mL).  Lifestyle  Do not use any products that contain nicotine or tobacco. These products include cigarettes, chewing tobacco, and vaping devices, such as e-cigarettes. If you need help quitting, ask your health care provider.  Do not use street drugs.  Do not share needles.  Ask your health care provider for help if you need support or information about quitting drugs.  General instructions  Schedule regular health, dental, and eye exams.  Stay current with your vaccines.  Tell your health care provider if:  You often feel depressed.  You have ever been abused or do not feel safe at home.  Summary  Adopting a healthy lifestyle and getting preventive care are important in promoting health and wellness.  Follow your health care provider's instructions about healthy diet, exercising, and getting tested or screened for diseases.  Follow your health care provider's instructions on monitoring your cholesterol and blood pressure.  This information is not intended to replace advice given to you by your health care provider. Make sure you discuss any questions you have with your health care provider.  Document Revised: 08/25/2020 Document Reviewed: 08/25/2020  Elsevier Patient Education  2024 [employer].

## 2024-03-19 NOTE — Progress Notes (Signed)
 Office Note 03/19/2024  CC:  Chief Complaint  Patient presents with   Annual Exam    Pt is fasting   HPI:  Patient is a 63 y.o. male who is here for annual health maintenance exam and follow-up hypertension, hyperlipidemia, and prediabetes.  1 tab lisin/hct 20-12.5 every day. BP normal at home.  Has intermittent pain in the right wrist ulnar aspect with twisting/rotating motions.  He does not know quite when it started but says all of his adult life he has done heavy work with pick and shovel in his yard.  Recently hurt more when he was twisting a wrench. No swelling or redness or warmth.  Past Medical History:  Diagnosis Date   Chronic renal insufficiency, stage 2 (mild) 2017   GFR 60s   Diverticulitis    Elevated transaminase level 2017   Mild: fatty liver on u/s 11/2015   Granuloma annulare 02/20/2008   Dr. Kaleta   Hyperlipemia, mixed    TLC recommended 2017.  Statin started 07/2017.   Hypertension    Obesity, Class I, BMI 30-34.9    Prediabetes 2017   gluc 131.  04/2017 fasting gluc 119, A1c 6.0%.  A1c stable 01/2018 and 10/2018. a1c up to 6.2% 10/2019 Dec 2023 6.1%.   TIA (transient ischemic attack) 07/2017   w/u unrevealing (CT head/MRI head/echo/carotid dopplers.  Plavix  + ASA x 3 wks, then plavix  alone for secondary prevention.  Doing well as of 12/2017 neuro f/u.    Past Surgical History:  Procedure Laterality Date   BICEPS TENDON REPAIR Right    2'16 -Dr. Camella   COLON SURGERY     COLONOSCOPY  approx 2010   normal.  Recall 10 yrs.   HAMMER TOE SURGERY  2015   LAPAROSCOPIC SIGMOID COLECTOMY N/A 01/31/2015   For diverticulitis.  Procedure: LAPAROSCOPIC SIGMOID COLECTOMY SPLENIC FLEXURE MOBILIZATION;  Surgeon: Lynda Leos, MD;  Location: [facility];  Service: General;  Laterality: N/A;   LIPOMA RESECTION  2009   Dr. Mercer   MOUTH SURGERY     SMALL INTESTINE SURGERY  01/2015   Small bowel anastomosis developed after sigmoid colectomy --repair required.    TRANSTHORACIC ECHOCARDIOGRAM  07/20/2017   Mild LVH, EF 55-60%, NORMAL.   VASECTOMY      Family History  Problem Relation Age of Onset   Gastric cancer Mother    Breast cancer Mother    Cancer Mother    Hypertension Father    Diabetes Father    Heart disease Father    Stroke Father    Early death Maternal Grandmother        MVA   Early death Maternal Grandfather        MVA   CVA Paternal Grandmother    Alzheimer's disease Paternal Grandfather    Colon cancer Neg Hx    Liver disease Neg Hx     Social History   Socioeconomic History   Marital status: Married    Spouse name: Not on file   Number of children: Not on file   Years of education: Not on file   Highest education level: Bachelor's degree (e.g., BA, AB, BS)  Occupational History   Not on file  Tobacco Use   Smoking status: Former    Current packs/day: 0.25    Types: Cigarettes   Smokeless tobacco: Never  Vaping Use   Vaping status: Never Used  Substance and Sexual Activity   Alcohol use: Yes    Alcohol/week: 8.0 standard drinks of alcohol  Types: 2 Glasses of wine, 6 Cans of beer per week    Comment: daily   Drug use: Not Currently   Sexual activity: Not Currently    Birth control/protection: None    Comment: It is boring.  Other Topics Concern   Not on file  Social History Narrative   Married, 3 daughters (one set of twin daughters), 1 son--all are grown and out of the house.   Educ: colleg grad--BS.   Occup: business Community Education Officer.   Tob: no signif regular smoking.   Alc: 2 drinks per week   Drugs: none   No hx of colon or prostate cancer.   Social Drivers of Corporate Investment Banker Strain: Low Risk  (03/16/2024)   Overall Financial Resource Strain (CARDIA)    Difficulty of Paying Living Expenses: Not hard at all  Food Insecurity: No Food Insecurity (03/16/2024)   Hunger Vital Sign    Worried About Running Out of Food in the Last Year: Never true    Ran Out of Food in the Last  Year: Never true  Transportation Needs: No Transportation Needs (03/16/2024)   PRAPARE - Administrator, Civil Service (Medical): No    Lack of Transportation (Non-Medical): No  Physical Activity: Sufficiently Active (03/16/2024)   Exercise Vital Sign    Days of Exercise per Week: 3 days    Minutes of Exercise per Session: 90 min  Stress: No Stress Concern Present (03/16/2024)   [employer] of Occupational Health - Occupational Stress Questionnaire    Feeling of Stress: Only a little  Social Connections: Unknown (03/16/2024)   Social Connection and Isolation Panel    Frequency of Communication with Friends and Family: Three times a week    Frequency of Social Gatherings with Friends and Family: Twice a week    Attends Religious Services: Patient declined    Database Administrator or Organizations: Patient declined    Attends Engineer, Structural: Not on file    Marital Status: Married  Catering Manager Violence: Not on file    Outpatient Medications Prior to Visit  Medication Sig Dispense Refill   amLODipine  (NORVASC ) 5 MG tablet Take 1 tablet (5 mg total) by mouth daily. 90 tablet 3   atorvastatin  (LIPITOR ) 80 MG tablet Take 1 tablet (80 mg total) by mouth daily at 6 PM. 90 tablet 3   clopidogrel  (PLAVIX ) 75 MG tablet Take 1 tablet (75 mg total) by mouth daily. 90 tablet 3   FIBER PO Take 1 tablet by mouth every morning.      GAVILYTE-G 236 g solution      lisinopril -hydrochlorothiazide  (ZESTORETIC ) 20-12.5 MG tablet TAKE 2 TABLETS BY MOUTH EVERY DAY (Patient taking differently: Take 1 tablet by mouth daily. TAKE 2 TABLETS BY MOUTH EVERY DAY) 180 tablet 3   tadalafil  (CIALIS ) 5 MG tablet Take 1 tablet (5 mg total) by mouth daily. 30 tablet 5   vitamin B-12 (CYANOCOBALAMIN ) 1000 MCG tablet Take 1,000 mcg by mouth every morning.      No facility-administered medications prior to visit.    No Known Allergies  Review of Systems  Constitutional:  Negative  for appetite change, chills, fatigue and fever.  HENT:  Negative for congestion, dental problem, ear pain and sore throat.   Eyes:  Negative for discharge, redness and visual disturbance.  Respiratory:  Negative for cough, chest tightness, shortness of breath and wheezing.   Cardiovascular:  Negative for chest pain, palpitations and leg swelling.  Gastrointestinal:  Negative for abdominal pain, blood in stool, diarrhea, nausea and vomiting.  Genitourinary:  Negative for difficulty urinating, dysuria, flank pain, frequency, hematuria and urgency.  Musculoskeletal:  Negative for arthralgias, back pain, joint swelling, myalgias and neck stiffness.  Skin:  Negative for pallor and rash.  Neurological:  Negative for dizziness, speech difficulty, weakness and headaches.  Hematological:  Negative for adenopathy. Does not bruise/bleed easily.  Psychiatric/Behavioral:  Negative for confusion and sleep disturbance. The patient is not nervous/anxious.     PE;    03/19/2024    7:54 AM 03/15/2023    8:03 AM 04/01/2022   10:23 AM  Vitals with BMI  Height 6' 1 6' 1 6' 1  Weight 226 lbs 13 oz 234 lbs 6 oz 233 lbs 10 oz  BMI 29.93 30.93 30.83  Systolic 122 126 896  Diastolic 78 79 55  Pulse 73 53 60    Gen: Alert, well appearing.  Patient is oriented to person, place, time, and situation. AFFECT: pleasant, lucid thought and speech. ENT: Ears: EACs clear, normal epithelium.  TMs with good light reflex and landmarks bilaterally.  Eyes: no injection, icteris, swelling, or exudate.  EOMI, PERRLA. Nose: no drainage or turbinate edema/swelling.  No injection or focal lesion.  Mouth: lips without lesion/swelling.  Oral mucosa pink and moist.  Dentition intact and without obvious caries or gingival swelling.  Oropharynx without erythema, exudate, or swelling.  Neck: supple/nontender.  No LAD, mass, or TM.  Carotid pulses 2+ bilaterally, without bruits. CV: RRR, no m/r/g.   LUNGS: CTA bilat, nonlabored  resps, good aeration in all lung fields. ABD: soft, NT, ND, BS normal.  No hepatospenomegaly or mass.  No bruits. EXT: no clubbing, cyanosis, or edema.  Musculoskeletal: no joint swelling, erythema, warmth, or tenderness.  ROM of all joints intact. Right wrist without tenderness to palpation.  Full range of motion against resistance.  He has a little bit of pain with resistance of pronation and supination of the right forearm and with full wrist inversion.  The pain is localized just distal to the ulnar styloid. Skin - no sores or suspicious lesions or rashes or color changes  Pertinent labs:  Lab Results  Component Value Date   TSH 5.20 03/24/2021   Lab Results  Component Value Date   WBC 5.7 03/15/2023   HGB 14.3 03/15/2023   HCT 42.0 03/15/2023   MCV 101.7 (H) 03/15/2023   PLT 174 03/15/2023   Lab Results  Component Value Date   VITAMINB12 596 06/03/2020    Lab Results  Component Value Date   CREATININE 1.31 05/06/2023   BUN 20 05/06/2023   NA 139 05/06/2023   K 4.1 05/06/2023   CL 102 05/06/2023   CO2 29 05/06/2023   Lab Results  Component Value Date   ALT 28 03/15/2023   AST 24 03/15/2023   ALKPHOS 38 (L) 04/01/2022   BILITOT 0.9 03/15/2023   Lab Results  Component Value Date   CHOL 152 03/15/2023   Lab Results  Component Value Date   HDL 60 03/15/2023   Lab Results  Component Value Date   LDLCALC 73 03/15/2023   Lab Results  Component Value Date   TRIG 104 03/15/2023   Lab Results  Component Value Date   CHOLHDL 2.5 03/15/2023   Lab Results  Component Value Date   PSA 1.00 03/15/2023   PSA 1.42 04/01/2022   PSA 0.95 03/24/2021   Lab Results  Component Value Date  HGBA1C 6.0 (H) 03/15/2023   ASSESSMENT AND PLAN:   1 health maintenance exam: Reviewed age and gender appropriate health maintenance issues (prudent diet, regular exercise, health risks of tobacco and excessive alcohol, use of seatbelts, fire alarms in home, use of sunscreen).   Also reviewed age and gender appropriate health screening as well as vaccine recommendations. Vaccines: Flu given today.  Prevnar 20-->given today.   Otherwise all up-to-date  Labs: fasting HP, Hba1c (prediabetes), PSA. Prostate ca screening: PSA. Colon ca screening: colonoscopy 02/23/24.  recall 2035  #2 hypertension, well-controlled on amlodipine  5 mg a day and lisinopril /HCTZ 20/12.5, ONE tab daily. Electrolytes and creatinine today.   3. hypercholesterolemia, doing well on a atorvastatin  80 mg a day. Lipid panel and hepatic panel today.  #4 chronic right wrist pain. Suspect TFCC injury.  Mild symptoms at this point.  Observe.  FOLLOW UP:  No follow-ups on file.  Signed:  Gerlene Hockey, MD           03/19/2024

## 2024-03-20 LAB — HEMOGLOBIN A1C
Hgb A1c MFr Bld: 5.9 % — ABNORMAL HIGH (ref <5.7)
Mean Plasma Glucose: 123 mg/dL
eAG (mmol/L): 6.8 mmol/L

## 2024-03-20 LAB — CBC WITH DIFFERENTIAL/PLATELET
Absolute Lymphocytes: 2782 {cells}/uL (ref 850–3900)
Absolute Monocytes: 470 {cells}/uL (ref 200–950)
Basophils Absolute: 49 {cells}/uL (ref 0–200)
Basophils Relative: 0.8 %
Eosinophils Absolute: 67 {cells}/uL (ref 15–500)
Eosinophils Relative: 1.1 %
HCT: 43.2 % (ref 39.4–51.1)
Hemoglobin: 15.1 g/dL (ref 13.2–17.1)
MCH: 34.8 pg — ABNORMAL HIGH (ref 27.0–33.0)
MCHC: 35 g/dL (ref 31.6–35.4)
MCV: 99.5 fL (ref 81.4–101.7)
MPV: 9.7 fL (ref 7.5–12.5)
Monocytes Relative: 7.7 %
Neutro Abs: 2733 {cells}/uL (ref 1500–7800)
Neutrophils Relative %: 44.8 %
Platelets: 192 Thousand/uL (ref 140–400)
RBC: 4.34 Million/uL (ref 4.20–5.80)
RDW: 12.6 % (ref 11.0–15.0)
Total Lymphocyte: 45.6 %
WBC: 6.1 Thousand/uL (ref 3.8–10.8)

## 2024-03-20 LAB — COMPREHENSIVE METABOLIC PANEL WITH GFR
AG Ratio: 2.4 (calc) (ref 1.0–2.5)
ALT: 29 U/L (ref 9–46)
AST: 25 U/L (ref 10–35)
Albumin: 5 g/dL (ref 3.6–5.1)
Alkaline phosphatase (APISO): 39 U/L (ref 35–144)
BUN/Creatinine Ratio: 14 (calc) (ref 6–22)
BUN: 20 mg/dL (ref 7–25)
CO2: 26 mmol/L (ref 20–32)
Calcium: 9.9 mg/dL (ref 8.6–10.3)
Chloride: 103 mmol/L (ref 98–110)
Creat: 1.48 mg/dL — ABNORMAL HIGH (ref 0.70–1.35)
Globulin: 2.1 g/dL (ref 1.9–3.7)
Glucose, Bld: 138 mg/dL — ABNORMAL HIGH (ref 65–99)
Potassium: 4.2 mmol/L (ref 3.5–5.3)
Sodium: 140 mmol/L (ref 135–146)
Total Bilirubin: 1.3 mg/dL — ABNORMAL HIGH (ref 0.2–1.2)
Total Protein: 7.1 g/dL (ref 6.1–8.1)
eGFR: 53 mL/min/1.73m2 — ABNORMAL LOW (ref >=60)

## 2024-03-20 LAB — LIPID PANEL
Cholesterol: 141 mg/dL (ref <200)
HDL: 53 mg/dL (ref >=40)
LDL Cholesterol (Calc): 64 mg/dL
Non-HDL Cholesterol (Calc): 88 mg/dL (ref <130)
Total CHOL/HDL Ratio: 2.7 (calc) (ref <5.0)
Triglycerides: 165 mg/dL — ABNORMAL HIGH (ref <150)

## 2024-03-20 LAB — PSA: PSA: 0.84 ng/mL (ref <=4.00)

## 2024-03-22 ENCOUNTER — Ambulatory Visit: Payer: Self-pay | Admitting: Family Medicine

## 2024-04-12 ENCOUNTER — Other Ambulatory Visit: Payer: Self-pay | Admitting: Family Medicine

## 2025-03-20 ENCOUNTER — Encounter: Admitting: Family Medicine
